# Patient Record
Sex: Female | Born: 1950 | Race: Black or African American | Hispanic: No | Smoking: Never smoker
Health system: Southern US, Community
[De-identification: ages and names within clinical notes are randomized; demographics above are authoritative.]

## PROBLEM LIST (undated history)

## (undated) DIAGNOSIS — R519 Headache, unspecified: Secondary | ICD-10-CM

## (undated) DIAGNOSIS — N189 Chronic kidney disease, unspecified: Secondary | ICD-10-CM

## (undated) DIAGNOSIS — R809 Proteinuria, unspecified: Secondary | ICD-10-CM

## (undated) DIAGNOSIS — I10 Essential (primary) hypertension: Secondary | ICD-10-CM

## (undated) DIAGNOSIS — R51 Headache: Secondary | ICD-10-CM

## (undated) DIAGNOSIS — M1990 Unspecified osteoarthritis, unspecified site: Secondary | ICD-10-CM

## (undated) DIAGNOSIS — E049 Nontoxic goiter, unspecified: Secondary | ICD-10-CM

## (undated) DIAGNOSIS — C50919 Malignant neoplasm of unspecified site of unspecified female breast: Secondary | ICD-10-CM

## (undated) DIAGNOSIS — E039 Hypothyroidism, unspecified: Secondary | ICD-10-CM

## (undated) DIAGNOSIS — C50212 Malignant neoplasm of upper-inner quadrant of left female breast: Secondary | ICD-10-CM

## (undated) DIAGNOSIS — Z972 Presence of dental prosthetic device (complete) (partial): Secondary | ICD-10-CM

## (undated) DIAGNOSIS — M5030 Other cervical disc degeneration, unspecified cervical region: Secondary | ICD-10-CM

## (undated) DIAGNOSIS — E119 Type 2 diabetes mellitus without complications: Secondary | ICD-10-CM

## (undated) DIAGNOSIS — E7800 Pure hypercholesterolemia, unspecified: Secondary | ICD-10-CM

## (undated) DIAGNOSIS — Z923 Personal history of irradiation: Secondary | ICD-10-CM

## (undated) DIAGNOSIS — K219 Gastro-esophageal reflux disease without esophagitis: Secondary | ICD-10-CM

## (undated) HISTORY — PX: ABDOMINAL HYSTERECTOMY: SHX81

## (undated) HISTORY — DX: Malignant neoplasm of unspecified site of unspecified female breast: C50.919

## (undated) HISTORY — PX: THYROIDECTOMY: SHX17

---
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
## 2004-09-13 ENCOUNTER — Ambulatory Visit: Payer: Self-pay

## 2007-01-17 ENCOUNTER — Ambulatory Visit: Payer: Self-pay | Admitting: Emergency Medicine

## 2007-03-01 ENCOUNTER — Ambulatory Visit: Payer: Self-pay | Admitting: Family Medicine

## 2007-05-19 ENCOUNTER — Emergency Department: Payer: Self-pay | Admitting: Emergency Medicine

## 2007-06-27 ENCOUNTER — Ambulatory Visit: Payer: Self-pay | Admitting: Emergency Medicine

## 2008-03-02 ENCOUNTER — Ambulatory Visit: Payer: Self-pay | Admitting: Internal Medicine

## 2008-03-23 ENCOUNTER — Ambulatory Visit: Payer: Self-pay | Admitting: Internal Medicine

## 2008-06-25 ENCOUNTER — Ambulatory Visit: Payer: Self-pay | Admitting: Family Medicine

## 2009-04-23 ENCOUNTER — Ambulatory Visit: Payer: Self-pay | Admitting: Internal Medicine

## 2010-04-27 ENCOUNTER — Ambulatory Visit: Payer: Self-pay | Admitting: Internal Medicine

## 2010-08-18 ENCOUNTER — Ambulatory Visit: Payer: Self-pay | Admitting: Internal Medicine

## 2011-06-05 ENCOUNTER — Ambulatory Visit: Payer: Self-pay | Admitting: Internal Medicine

## 2011-07-20 ENCOUNTER — Ambulatory Visit: Payer: Self-pay | Admitting: Internal Medicine

## 2011-07-20 LAB — URINALYSIS, COMPLETE
Bacteria: NEGATIVE
Bilirubin,UR: NEGATIVE
Blood: NEGATIVE
Glucose,UR: NEGATIVE mg/dL (ref 0–75)
Ketone: NEGATIVE
Nitrite: NEGATIVE
Ph: 7 (ref 4.5–8.0)
RBC,UR: NONE SEEN /HPF (ref 0–5)
Specific Gravity: 1.015 (ref 1.003–1.030)

## 2011-07-30 ENCOUNTER — Ambulatory Visit: Payer: Self-pay

## 2011-10-22 ENCOUNTER — Ambulatory Visit: Payer: Self-pay | Admitting: Family Medicine

## 2012-11-05 ENCOUNTER — Ambulatory Visit: Payer: Self-pay | Admitting: Emergency Medicine

## 2013-01-08 ENCOUNTER — Ambulatory Visit: Payer: Self-pay | Admitting: Family Medicine

## 2013-05-24 ENCOUNTER — Ambulatory Visit: Payer: Self-pay | Admitting: Internal Medicine

## 2013-05-29 ENCOUNTER — Ambulatory Visit: Payer: Self-pay | Admitting: Physician Assistant

## 2013-06-23 ENCOUNTER — Ambulatory Visit: Payer: Self-pay

## 2013-07-21 ENCOUNTER — Ambulatory Visit: Payer: Self-pay

## 2013-07-21 LAB — RAPID INFLUENZA A&B ANTIGENS (ARMC ONLY)

## 2014-05-01 ENCOUNTER — Ambulatory Visit: Payer: Self-pay | Admitting: Physician Assistant

## 2014-06-15 ENCOUNTER — Ambulatory Visit: Payer: Self-pay | Admitting: Internal Medicine

## 2015-01-22 ENCOUNTER — Encounter: Payer: Self-pay | Admitting: Anesthesiology

## 2015-01-22 ENCOUNTER — Encounter: Payer: Self-pay | Admitting: *Deleted

## 2015-01-25 NOTE — Discharge Instructions (Signed)

## 2015-01-30 ENCOUNTER — Ambulatory Visit
Admission: RE | Admit: 2015-01-30 | Payer: BLUE CROSS/BLUE SHIELD | Source: Ambulatory Visit | Admitting: Gastroenterology

## 2015-01-30 HISTORY — DX: Presence of dental prosthetic device (complete) (partial): Z97.2

## 2015-01-30 HISTORY — DX: Type 2 diabetes mellitus without complications: E11.9

## 2015-01-30 HISTORY — DX: Essential (primary) hypertension: I10

## 2015-01-30 HISTORY — DX: Pure hypercholesterolemia, unspecified: E78.00

## 2015-01-30 HISTORY — DX: Headache: R51

## 2015-01-30 HISTORY — DX: Hypothyroidism, unspecified: E03.9

## 2015-01-30 HISTORY — DX: Headache, unspecified: R51.9

## 2015-01-30 SURGERY — COLONOSCOPY

## 2015-02-08 ENCOUNTER — Encounter: Payer: Self-pay | Admitting: *Deleted

## 2015-02-09 ENCOUNTER — Ambulatory Visit
Admission: RE | Admit: 2015-02-09 | Discharge: 2015-02-09 | Disposition: A | Discharge status: Home / Self Care | Payer: BLUE CROSS/BLUE SHIELD | Source: Ambulatory Visit | Attending: Gastroenterology | Admitting: Gastroenterology

## 2015-02-09 ENCOUNTER — Ambulatory Visit: Payer: BLUE CROSS/BLUE SHIELD | Admitting: Anesthesiology

## 2015-02-09 ENCOUNTER — Encounter
Admission: RE | Disposition: A | Discharge status: Home / Self Care | Payer: Self-pay | Source: Ambulatory Visit | Attending: Gastroenterology

## 2015-02-09 ENCOUNTER — Encounter: Payer: Self-pay | Admitting: Anesthesiology

## 2015-02-09 DIAGNOSIS — E119 Type 2 diabetes mellitus without complications: Secondary | ICD-10-CM | POA: Diagnosis not present

## 2015-02-09 DIAGNOSIS — Z888 Allergy status to other drugs, medicaments and biological substances status: Secondary | ICD-10-CM | POA: Insufficient documentation

## 2015-02-09 DIAGNOSIS — Z881 Allergy status to other antibiotic agents status: Secondary | ICD-10-CM | POA: Insufficient documentation

## 2015-02-09 DIAGNOSIS — Z79899 Other long term (current) drug therapy: Secondary | ICD-10-CM | POA: Insufficient documentation

## 2015-02-09 DIAGNOSIS — E890 Postprocedural hypothyroidism: Secondary | ICD-10-CM | POA: Insufficient documentation

## 2015-02-09 DIAGNOSIS — Z791 Long term (current) use of non-steroidal anti-inflammatories (NSAID): Secondary | ICD-10-CM | POA: Diagnosis not present

## 2015-02-09 DIAGNOSIS — I10 Essential (primary) hypertension: Secondary | ICD-10-CM | POA: Insufficient documentation

## 2015-02-09 DIAGNOSIS — Z1211 Encounter for screening for malignant neoplasm of colon: Secondary | ICD-10-CM | POA: Insufficient documentation

## 2015-02-09 DIAGNOSIS — Z90710 Acquired absence of both cervix and uterus: Secondary | ICD-10-CM | POA: Diagnosis not present

## 2015-02-09 DIAGNOSIS — E780 Pure hypercholesterolemia: Secondary | ICD-10-CM | POA: Insufficient documentation

## 2015-02-09 HISTORY — PX: COLONOSCOPY WITH PROPOFOL: SHX5780

## 2015-02-09 HISTORY — DX: Nontoxic goiter, unspecified: E04.9

## 2015-02-09 HISTORY — DX: Proteinuria, unspecified: R80.9

## 2015-02-09 LAB — GLUCOSE, CAPILLARY: GLUCOSE-CAPILLARY: 144 mg/dL — AB (ref 65–99)

## 2015-02-09 SURGERY — COLONOSCOPY WITH PROPOFOL
Anesthesia: General

## 2015-02-09 MED ORDER — LIDOCAINE HCL (CARDIAC) 10 MG/ML IV SOLN
INTRAVENOUS | Status: DC | PRN
  Administered 2015-02-09: 1 mL via INTRAVENOUS

## 2015-02-09 MED ORDER — PROPOFOL 10 MG/ML IV BOLUS
INTRAVENOUS | Status: DC | PRN
  Administered 2015-02-09: 100 mg via INTRAVENOUS

## 2015-02-09 MED ORDER — PROPOFOL INFUSION 10 MG/ML OPTIME
INTRAVENOUS | Status: DC | PRN
  Administered 2015-02-09: 125 ug/kg/min via INTRAVENOUS

## 2015-02-09 MED ORDER — SODIUM CHLORIDE 0.9 % IV SOLN
INTRAVENOUS | Status: DC
Start: 2015-02-09 — End: 2015-02-09
  Administered 2015-02-09: 09:00:00 via INTRAVENOUS

## 2015-02-09 MED ORDER — SODIUM CHLORIDE 0.9 % IV SOLN: INTRAVENOUS | Status: DC

## 2015-02-09 NOTE — Op Note (Signed)
[facility] Gastroenterology Patient Name: Caitlin Ford Procedure Date: 02/09/2015 9:28 AM MRN: [id number] Account #: [id number] Date of Birth: [birth year] Admit Type: Outpatient Age: 63 Room: [facility] ENDO ROOM 4 Gender: Female Note Status: Finalized Procedure:         Colonoscopy Indications:       Screening for colorectal malignant neoplasm Providers:         Lupita Dawn. Candace Cruise, MD Referring MD:      Christena Flake. [name], MD (Referring MD) Medicines:         Monitored Anesthesia Care Complications:     No immediate complications. Procedure:         Pre-Anesthesia Assessment:                    - Prior to the procedure, a History and Physical was                     performed, and patient medications, allergies and                     sensitivities were reviewed. The patient's tolerance of                     previous anesthesia was reviewed.                    - The risks and benefits of the procedure and the sedation                     options and risks were discussed with the patient. All                     questions were answered and informed consent was obtained.                    - After reviewing the risks and benefits, the patient was                     deemed in satisfactory condition to undergo the procedure.                    After obtaining informed consent, the colonoscope was                     passed under direct vision. Throughout the procedure, the                     patient's blood pressure, pulse, and oxygen saturations                     were monitored continuously. The Colonoscope was                     introduced through the anus and advanced to the the cecum,                     identified by appendiceal orifice and ileocecal valve. The                     colonoscopy was performed without difficulty. The patient                     tolerated the procedure well. The quality of the bowel  preparation was good. Findings:      The  colon (entire examined portion) appeared normal. Impression:        - The entire examined colon is normal.                    - No specimens collected. Recommendation:    - Discharge patient to home.                    - Repeat colonoscopy in 10 years for surveillance.                    - The findings and recommendations were discussed with the                     patient. Procedure Code(s): --- Professional ---                    [id number], Colonoscopy, flexible; diagnostic, including                     collection of specimen(s) by brushing or washing, when                     performed (separate procedure) Diagnosis Code(s): --- Professional ---                    Z12.11, Encounter for screening for malignant neoplasm of                     colon CPT copyright 2014 American Medical Association. All rights reserved. The codes documented in this report are preliminary and upon coder review may  be revised to meet current compliance requirements. Hulen Luster, MD 02/09/2015 9:43:45 AM This report has been signed electronically. Number of Addenda: 0 Note Initiated On: 02/09/2015 9:28 AM Scope Withdrawal Time: 0 hours 6 minutes 1 second  Total Procedure Duration: 0 hours 9 minutes 55 seconds       [facility]

## 2015-02-09 NOTE — Transfer of Care (Signed)
Immediate Anesthesia Transfer of Care Note  Patient: Caitlin Ford  Procedure(s) Performed: Procedure(s): COLONOSCOPY WITH PROPOFOL (N/A)  Patient Location: PACU and Endoscopy Unit  Anesthesia Type:General  Level of Consciousness: awake and alert   Airway & Oxygen Therapy: Patient Spontanous Breathing and Patient connected to nasal cannula oxygen  Post-op Assessment: Report given to RN and Post -op Vital signs reviewed and stable  Post vital signs: Reviewed and stable  Last Vitals:  Filed Vitals:   02/09/15 0947  BP:   Pulse:   Temp: 36.1 C  Resp:     Complications: No apparent anesthesia complications

## 2015-02-09 NOTE — H&P (Signed)
Primary Care Physician:  Ezequiel Kayser, MD Primary Gastroenterologist:  Dr. Candace Cruise  Pre-Procedure History & Physical: HPI:  Caitlin Ford is a 64 y.o. female is here for an colonoscopy.   Past Medical History  Diagnosis Date  . Hypothyroidism   . Diabetes mellitus without complication   . Hypertension   . Hypercholesteremia   . Wears dentures     partial upper, full lower  . Headache     simus  . Goiter   . Proteinuria     Past Surgical History  Procedure Laterality Date  . Abdominal hysterectomy    . Thyroidectomy      Prior to Admission medications   Medication Sig Start Date End Date Taking? Authorizing Provider  ibuprofen (ADVIL,MOTRIN) 800 MG tablet Take 800 mg by mouth every 8 (eight) hours as needed.   Yes Historical Provider, MD  levothyroxine (SYNTHROID, LEVOTHROID) 150 MCG tablet Take 150 mcg by mouth daily before breakfast.   Yes Historical Provider, MD  loratadine (CLARITIN) 10 MG tablet Take 10 mg by mouth daily.   Yes Historical Provider, MD  losartan (COZAAR) 100 MG tablet Take 100 mg by mouth daily.   Yes Historical Provider, MD  glimepiride (AMARYL) 4 MG tablet Take 4 mg by mouth daily with breakfast.    Historical Provider, MD  metFORMIN (GLUCOPHAGE) 1000 MG tablet Take 1,000 mg by mouth 2 (two) times daily with a meal.    Historical Provider, MD  Multiple Vitamins-Minerals (DAILY MULTIVITAMIN PO) Take by mouth.    Historical Provider, MD  simvastatin (ZOCOR) 40 MG tablet Take 40 mg by mouth daily.    Historical Provider, MD    Allergies as of 01/23/2015 - Review Complete 01/22/2015  Allergen Reaction Noted  . Altace [ramipril] Other (See Comments) 01/22/2015  . Avandia [rosiglitazone]  01/22/2015  . Crestor [rosuvastatin] Other (See Comments) 01/22/2015  . Doxycycline  01/22/2015  . Lipitor [atorvastatin] Other (See Comments) 01/22/2015    No family history on file.  Social History   Social History  . Marital Status: Married    Spouse Name:  N/A  . Number of Children: N/A  . Years of Education: N/A   Occupational History  . Not on file.   Social History Main Topics  . Smoking status: Never Smoker   . Smokeless tobacco: Not on file  . Alcohol Use: Not on file  . Drug Use: Not on file  . Sexual Activity: Not on file   Other Topics Concern  . Not on file   Social History Narrative    Review of Systems: See HPI, otherwise negative ROS  Physical Exam: BP 151/80 mmHg  Pulse 74  Temp(Src) 97.7 F (36.5 C) (Tympanic)  Resp 20  Ht 5\' 7"  (1.702 m)  Wt 90.719 kg (200 lb)  BMI 31.32 kg/m2  SpO2 100% General:   Alert,  pleasant and cooperative in NAD Head:  Normocephalic and atraumatic. Neck:  Supple; no masses or thyromegaly. Lungs:  Clear throughout to auscultation.    Heart:  Regular rate and rhythm. Abdomen:  Soft, nontender and nondistended. Normal bowel sounds, without guarding, and without rebound.   Neurologic:  Alert and  oriented x4;  grossly normal neurologically.  Impression/Plan: Itsel Opfer is here for an colonoscopy to be performed for screening.  Risks, benefits, limitations, and alternatives regarding colonoscopy have been reviewed with the patient.  Questions have been answered.  All parties agreeable.   OH, Lupita Dawn, MD  02/09/2015, 8:44 AM

## 2015-02-09 NOTE — Anesthesia Postprocedure Evaluation (Signed)
  Anesthesia Post-op Note  Patient: Caitlin Ford  Procedure(s) Performed: Procedure(s): COLONOSCOPY WITH PROPOFOL (N/A)  Anesthesia type:General  Patient location: PACU  Post pain: Pain level controlled  Post assessment: Post-op Vital signs reviewed, Patient's Cardiovascular Status Stable, Respiratory Function Stable, Patent Airway and No signs of Nausea or vomiting  Post vital signs: Reviewed and stable  Last Vitals:  Filed Vitals:   02/09/15 1020  BP: 133/79  Pulse: 67  Temp:   Resp: 15    Level of consciousness: awake, alert  and patient cooperative  Complications: No apparent anesthesia complications

## 2015-02-09 NOTE — Anesthesia Preprocedure Evaluation (Signed)
Anesthesia Evaluation  Patient identified by MRN, date of birth, ID band Patient awake    Reviewed: Allergy & Precautions, H&P , NPO status , Patient's Chart, lab work & pertinent test results, reviewed documented beta blocker date and time   History of Anesthesia Complications Negative for: history of anesthetic complications  Airway Mallampati: II  TM Distance: >3 FB Neck ROM: full    Dental no notable dental hx. (+) Partial Upper, Lower Dentures   Pulmonary neg pulmonary ROS,    Pulmonary exam normal breath sounds clear to auscultation       Cardiovascular Exercise Tolerance: Good hypertension, Normal cardiovascular exam Rhythm:regular Rate:Normal     Neuro/Psych negative neurological ROS  negative psych ROS   GI/Hepatic negative GI ROS, Neg liver ROS,   Endo/Other  diabetes, Well Controlled, Oral Hypoglycemic AgentsHypothyroidism   Renal/GU negative Renal ROS  negative genitourinary   Musculoskeletal   Abdominal   Peds  Hematology negative hematology ROS (+)   Anesthesia Other Findings Past Medical History:   Hypothyroidism                                               Diabetes mellitus without complication                       Hypertension                                                 Hypercholesteremia                                           Wears dentures                                                 Comment:partial upper, full lower   Headache                                                       Comment:simus   Goiter                                                       Proteinuria                                                  Reproductive/Obstetrics negative OB ROS                             Anesthesia Physical Anesthesia Plan  ASA: III  Anesthesia Plan: General   Post-op Pain Management:    Induction:   Airway Management Planned:   Additional  Equipment:   Intra-op Plan:   Post-operative Plan:   Informed Consent: I have reviewed the patients History and Physical, chart, labs and discussed the procedure including the risks, benefits and alternatives for the proposed anesthesia with the patient or authorized representative who has indicated his/her understanding and acceptance.   Dental Advisory Given  Plan Discussed with: Anesthesiologist, CRNA and Surgeon  Anesthesia Plan Comments:         Anesthesia Quick Evaluation

## 2015-02-09 NOTE — Anesthesia Procedure Notes (Signed)
Date/Time: 02/09/2015 9:27 AM Performed by: Martha Clan Oxygen Delivery Method: Nasal cannula

## 2015-02-10 ENCOUNTER — Encounter: Payer: Self-pay | Admitting: Gastroenterology

## 2015-07-01 ENCOUNTER — Encounter: Payer: Self-pay | Admitting: *Deleted

## 2015-07-01 ENCOUNTER — Ambulatory Visit
Admission: EM | Admit: 2015-07-01 | Discharge: 2015-07-01 | Disposition: A | Discharge status: Home / Self Care | Payer: BLUE CROSS/BLUE SHIELD | Attending: Family Medicine | Admitting: Family Medicine

## 2015-07-01 DIAGNOSIS — T148 Other injury of unspecified body region: Secondary | ICD-10-CM

## 2015-07-01 DIAGNOSIS — T148XXA Other injury of unspecified body region, initial encounter: Secondary | ICD-10-CM

## 2015-07-01 LAB — URINALYSIS COMPLETE WITH MICROSCOPIC (ARMC ONLY)
BACTERIA UA: NONE SEEN
Bilirubin Urine: NEGATIVE
Glucose, UA: NEGATIVE mg/dL
HGB URINE DIPSTICK: NEGATIVE
LEUKOCYTES UA: NEGATIVE
NITRITE: NEGATIVE
PH: 5.5 (ref 5.0–8.0)
PROTEIN: 30 mg/dL — AB
RBC / HPF: NONE SEEN RBC/hpf (ref 0–5)
SPECIFIC GRAVITY, URINE: 1.02 (ref 1.005–1.030)

## 2015-07-01 MED ORDER — CYCLOBENZAPRINE HCL 5 MG PO TABS: 5.0000 mg | ORAL_TABLET | Freq: Three times a day (TID) | ORAL | Status: DC | PRN

## 2015-07-01 MED ORDER — IBUPROFEN 800 MG PO TABS: 800.0000 mg | ORAL_TABLET | Freq: Three times a day (TID) | ORAL | Status: DC | PRN

## 2015-07-01 MED ORDER — KETOROLAC TROMETHAMINE 60 MG/2ML IM SOLN
60.0000 mg | Freq: Once | INTRAMUSCULAR | Status: AC
  Administered 2015-07-01: 60 mg via INTRAMUSCULAR

## 2015-07-01 NOTE — ED Provider Notes (Signed)
[facility]  ____________________________________________  Time seen: Approximately 3:45 PM  I have reviewed the triage vital signs and the nursing notes.   HISTORY  Chief Complaint Back Pain   HPI Caitlin Ford is a 65 y.o. female  presents with a complaint of right lower back pain. Patient states that right lower back pain has been present 1 week. Patient states that one week ago she was at her home and trying to paint. Patient states that in the process of painting she was moving some furniture around. Patient states that while she was moving some furniture she did feel some lower back pain intermittently that was gradual onset with multiple movements. Denies any one particular movement that caused onset of back pain. Denies any fall or direct trauma. Patient reports that the next day the pain was present when she woke up. Patient states that pain has continued throughout the week.   Patient states that if sitting still and not moving she does not have any pain. Patient states that the pain is only present with movement. Patient states that the worst pain is with bending down and up or twisting to her right. States current pain is 0 out of 10 but with movement and states that the pain is 5 out of 10. Patient states the pain feels almost like a spasm that catches her. Denies history of falls at home.  Denies any pain radiation. Denies any numbness or tingling sensation. Denies any urinary or bowel retention or incontinence. Denies any weakness or numbness. Denies any urinary frequency, urgency, burning with urination, dysuria, vaginal complaints, hematuria or abnormal stool. Denies abnormal urine color abnormal stool color.  PCP: Dr Mayo Ao menopausal   Past Medical History  Diagnosis Date  . Hypothyroidism   . Diabetes mellitus without complication ([place])   . Hypertension   . Hypercholesteremia   . Wears dentures     partial upper, full lower  . Headache     simus   . Goiter   . Proteinuria     There are no active problems to display for this patient.   Past Surgical History  Procedure Laterality Date  . Abdominal hysterectomy    . Thyroidectomy    . Colonoscopy with propofol N/A 02/09/2015    Procedure: COLONOSCOPY WITH PROPOFOL;  Surgeon: Hulen Luster, MD;  Location: [facility] ENDOSCOPY;  Service: Gastroenterology;  Laterality: N/A;    Current Outpatient Rx  Name  Route  Sig  Dispense  Refill  . glimepiride (AMARYL) 4 MG tablet   Oral   Take 4 mg by mouth daily with breakfast.         . ibuprofen (ADVIL,MOTRIN) 800 MG tablet   Oral   Take 800 mg by mouth every 8 (eight) hours as needed.         [name] levothyroxine (SYNTHROID, LEVOTHROID) 150 MCG tablet   Oral   Take 150 mcg by mouth daily before breakfast.         . losartan (COZAAR) 100 MG tablet   Oral   Take 100 mg by mouth daily.         . metFORMIN (GLUCOPHAGE) 1000 MG tablet   Oral   Take 1,000 mg by mouth 2 (two) times daily with a meal.         . Multiple Vitamins-Minerals (DAILY MULTIVITAMIN PO)   Oral   Take by mouth.         . simvastatin (ZOCOR) 40 MG tablet  Oral   Take 40 mg by mouth daily.         [name] loratadine (CLARITIN) 10 MG tablet   Oral   Take 10 mg by mouth daily.           Allergies Altace; Crestor; Lipitor; Avandia; and Doxycycline  History reviewed. No pertinent family history.  Social History Social History  Substance Use Topics  . Smoking status: Never Smoker   . Smokeless tobacco: Never Used  . Alcohol Use: No    Review of Systems Constitutional: No fever/chills Eyes: No visual changes. ENT: No sore throat. Cardiovascular: Denies chest pain. Respiratory: Denies shortness of breath. Gastrointestinal: No abdominal pain.  No nausea, no vomiting.  No diarrhea.  No constipation. Genitourinary: Negative for dysuria. Musculoskeletal: positive for back pain. Skin: Negative for rash. Neurological: Negative for headaches, focal  weakness or numbness.  10-point ROS otherwise negative.  ____________________________________________   PHYSICAL EXAM:  VITAL SIGNS: ED Triage Vitals  Enc Vitals Group     BP 07/01/15 1517 163/77 mmHg     Pulse Rate 07/01/15 1517 72     Resp 07/01/15 1517 16     Temp 07/01/15 1517 98.2 F (36.8 C)     Temp Source 07/01/15 1517 Oral     SpO2 07/01/15 1517 100 %     Weight 07/01/15 1517 200 lb (90.719 kg)     Height 07/01/15 1517 5\' 7"  (1.702 m)     Head Cir --      Peak Flow --      Pain Score 07/01/15 1524 1     Pain Loc --      Pain Edu? --      Excl. in [place]? --     Constitutional: Alert and oriented. Well appearing and in no acute distress. Eyes: Conjunctivae are normal. PERRL. EOMI. Head: Atraumatic.  Ears: no erythema, normal TMs bilaterally.   Nose: No congestion/rhinnorhea.  Mouth/Throat: Mucous membranes are moist.  Oropharynx non-erythematous. Neck: No stridor.  No cervical spine tenderness to palpation. Hematological/Lymphatic/Immunilogical: No cervical lymphadenopathy. Cardiovascular: Normal rate, regular rhythm. Grossly normal heart sounds.  Good peripheral circulation. Respiratory: Normal respiratory effort.  No retractions. Lungs CTAB. Gastrointestinal: Soft and nontender. No distention. Normal Bowel sounds.  No abdominal bruits. No CVA tenderness. Musculoskeletal: No lower or upper extremity tenderness nor edema.  No joint effusions. Bilateral pedal pulses equal and easily palpated.  no midline cervical, thoracic or lumbar tender to palpation. No saddle anesthesia. Bilateral straight leg test negative.  Except: pain fully reproducible per patient, with active flexion and extension of lumbar as well as lumbar rotation, Mild TTP right paralumbar, no swelling, no ecchymosis, full ROM present. No saddle anesthesia. Bilateral plantar flexion and dorsiflexion strong and equal. 5/5 strength to bilateral upper and lower extremities. Changes positions from lying to  standing quickly without discomfort or distress.  Neurologic:  Normal speech and language. No gross focal neurologic deficits are appreciated. No gait instability. Skin:  Skin is warm, dry and intact. No rash noted. Psychiatric: Mood and affect are normal. Speech and behavior are normal.  ____________________________________________   LABS (all labs ordered are listed, but only abnormal results are displayed)  Labs Reviewed  URINALYSIS COMPLETEWITH MICROSCOPIC ([facility] ONLY) - Abnormal; Notable for the following:    Ketones, ur TRACE (*)    Protein, ur 30 (*)    Squamous Epithelial / LPF 0-5 (*)    All other components within normal limits   _  INITIAL IMPRESSION / ASSESSMENT  AND PLAN / ED COURSE  Pertinent labs & imaging results that were available during my care of the patient were reviewed by me and considered in my medical decision making (see chart for details).  Very well-appearing patient. No acute distress. Presents for the complaint of right lower lateral lumbar pain with movement which was after moving furniture around in her house. No midline tenderness, no bony tenderness. No pain at rest. Full range of motion. No focal neurological deficit. Per patient pain fully reproducible with active flexion and extension of lumbar as well as lumbar rotation. Urinalysis negative for bacteria, RBCs and hemoglobin. Suspect muscular strain. Toradol 60 mg IM 1 given in urgent care.   Post Toradol patient reports pain improved. Very well-appearing patient. Will treat patient outpatient with oral ibuprofen and Flexeril as needed. Patient reports she has taked Flexeril in the past and tolerated well.  Discussed follow up with Primary care physician this week. Discussed follow up and return parameters including no resolution or any worsening concerns. Patient verbalized understanding and agreed to plan.   ____________________________________________   FINAL CLINICAL IMPRESSION(S) / ED  DIAGNOSES  Final diagnoses:  Muscle strain      Note: This dictation was prepared with Dragon dictation along with smaller phrase technology. Any transcriptional errors that result from this process are unintentional.    Marylene Land, NP 07/04/15 1744

## 2015-07-01 NOTE — Discharge Instructions (Signed)
Take medication as prescribed. Rest. Drink plenty of fluids. Alternate heat and ice for comfort.  ° °Follow up with your primary care physician this week as needed. Return to Urgent care for new or worsening concerns.  ° °

## 2015-07-01 NOTE — ED Notes (Signed)
Patient states that she experienced right-sided lower back pain starting 1 week ago after moving some furniture.  No pain going down legs.

## 2015-07-20 ENCOUNTER — Ambulatory Visit
Admission: EM | Admit: 2015-07-20 | Discharge: 2015-07-20 | Disposition: A | Discharge status: Home / Self Care | Payer: BLUE CROSS/BLUE SHIELD | Attending: Family Medicine | Admitting: Family Medicine

## 2015-07-20 DIAGNOSIS — B029 Zoster without complications: Secondary | ICD-10-CM

## 2015-07-20 MED ORDER — TRAMADOL HCL 50 MG PO TABS: 50.0000 mg | ORAL_TABLET | Freq: Three times a day (TID) | ORAL | Status: DC | PRN

## 2015-07-20 MED ORDER — VALACYCLOVIR HCL 1 G PO TABS: 1000.0000 mg | ORAL_TABLET | Freq: Three times a day (TID) | ORAL | Status: AC

## 2015-07-20 NOTE — Discharge Instructions (Signed)
Take medication as prescribed. Rest. Avoid rubbing or scratching. Wear soft bra that does not have an underwire that rubs that area.  Drink plenty of fluids.   Follow up with your primary care physician this week as needed. Return to Urgent care for new or worsening concerns.    Shingles Shingles, which is also known as herpes zoster, is an infection that causes a painful skin rash and fluid-filled blisters. Shingles is not related to genital herpes, which is a sexually transmitted infection.   Shingles only develops in people who:  Have had chickenpox.  Have received the chickenpox vaccine. (This is rare.) CAUSES Shingles is caused by varicella-zoster virus (VZV). This is the same virus that causes chickenpox. After exposure to VZV, the virus stays in the body in an inactive (dormant) state. Shingles develops if the virus reactivates. This can happen many years after the initial exposure to VZV. It is not known what causes this virus to reactivate. RISK FACTORS People who have had chickenpox or received the chickenpox vaccine are at risk for shingles. Infection is more common in people who:  Are older than age 39.  Have a weakened defense (immune) system, such as those with HIV, AIDS, or cancer.  Are taking medicines that weaken the immune system, such as transplant medicines.  Are under great stress. SYMPTOMS Early symptoms of this condition include itching, tingling, and pain in an area on your skin. Pain may be described as burning, stabbing, or throbbing. A few days or weeks after symptoms start, a painful red rash appears, usually on one side of the body in a bandlike or beltlike pattern. The rash eventually turns into fluid-filled blisters that break open, scab over, and dry up in about 2-3 weeks. At any time during the infection, you may also develop:  A fever.  Chills.  A headache.  An upset stomach. DIAGNOSIS This condition is diagnosed with a skin exam. Sometimes,  skin or fluid samples are taken from the blisters before a diagnosis is made. These samples are examined under a microscope or sent to a lab for testing. TREATMENT There is no specific cure for this condition. Your health care provider will probably prescribe medicines to help you manage pain, recover more quickly, and avoid long-term problems. Medicines may include:  Antiviral drugs.  Anti-inflammatory drugs.  Pain medicines. If the area involved is on your face, you may be referred to a specialist, such as an eye doctor (ophthalmologist) or an ear, nose, and throat (ENT) doctor to help you avoid eye problems, chronic pain, or disability. HOME CARE INSTRUCTIONS Medicines  Take medicines only as directed by your health care provider.  Apply an anti-itch or numbing cream to the affected area as directed by your health care provider. Blister and Rash Care  Take a cool bath or apply cool compresses to the area of the rash or blisters as directed by your health care provider. This may help with pain and itching.  Keep your rash covered with a loose bandage (dressing). Wear loose-fitting clothing to help ease the pain of material rubbing against the rash.  Keep your rash and blisters clean with mild soap and cool water or as directed by your health care provider.  Check your rash every day for signs of infection. These include redness, swelling, and pain that lasts or increases.  Do not pick your blisters.  Do not scratch your rash. General Instructions  Rest as directed by your health care provider.  Keep all follow-up  visits as directed by your health care provider. This is important.  Until your blisters scab over, your infection can cause chickenpox in people who have never had it or been vaccinated against it. To prevent this from happening, avoid contact with other people, especially:  Babies.  Pregnant women.  Children who have eczema.  Elderly people who have  transplants.  People who have chronic illnesses, such as leukemia or AIDS. SEEK MEDICAL CARE IF:  Your pain is not relieved with prescribed medicines.  Your pain does not get better after the rash heals.  Your rash looks infected. Signs of infection include redness, swelling, and pain that lasts or increases. SEEK IMMEDIATE MEDICAL CARE IF:  The rash is on your face or nose.  You have facial pain, pain around your eye area, or loss of feeling on one side of your face.  You have ear pain or you have ringing in your ear.  You have loss of taste.  Your condition gets worse.   This information is not intended to replace advice given to you by your health care provider. Make sure you discuss any questions you have with your health care provider.   Document Released: 05/12/2005 Document Revised: 06/02/2014 Document Reviewed: 03/23/2014 Elsevier Interactive Patient Education [employer].

## 2015-07-20 NOTE — ED Notes (Signed)
Patient c/o painful rash upper left side of stomach which she noticed this past Monday.

## 2015-07-20 NOTE — ED Provider Notes (Addendum)
[facility]  ____________________________________________  Time seen: Approximately 9:29 PM  I have reviewed the triage vital signs and the nursing notes.   HISTORY  Chief Complaint Rash   HPI Caitlin Ford is a 65 y.o. female presents for complaint of rash to left upper abdomen 3 days. Patient states that the rash is tender to touch and described as a burning sensation. States that is more burning sensation than a pain. Denies pain. Denies known trigger. Denies changes in foods, medicines, lotions, detergents or other contacts. States that occasionally he does have some itching sensations. Denies other rash anywhere. Denies injury.Denies fever. Reports continues to eat and drink well. Denies insect bite, tick bite or tick exposure.  Denies other complaints. Denies abdominal pain, chest pain, shortness of breath, extremity swelling.    PCP: Dr. Dorthula Perfect. Patient reports she has an appointment with him in 1 week.   Past Medical History  Diagnosis Date  . Hypothyroidism   . Diabetes mellitus without complication ([place])   . Hypertension   . Hypercholesteremia   . Wears dentures     partial upper, full lower  . Headache     simus  . Goiter   . Proteinuria     There are no active problems to display for this patient.   Past Surgical History  Procedure Laterality Date  . Abdominal hysterectomy    . Thyroidectomy    . Colonoscopy with propofol N/A 02/09/2015    Procedure: COLONOSCOPY WITH PROPOFOL;  Surgeon: Hulen Luster, MD;  Location: [facility] ENDOSCOPY;  Service: Gastroenterology;  Laterality: N/A;    Current Outpatient Rx  Name  Route  Sig  Dispense  Refill  . glimepiride (AMARYL) 4 MG tablet   Oral   Take 4 mg by mouth daily with breakfast.         . levothyroxine (SYNTHROID, LEVOTHROID) 150 MCG tablet   Oral   Take 150 mcg by mouth daily before breakfast.         . loratadine (CLARITIN) 10 MG tablet   Oral   Take 10 mg by mouth daily.         [name]  losartan (COZAAR) 100 MG tablet   Oral   Take 100 mg by mouth daily.         . metFORMIN (GLUCOPHAGE) 1000 MG tablet   Oral   Take 1,000 mg by mouth 2 (two) times daily with a meal.         . Multiple Vitamins-Minerals (DAILY MULTIVITAMIN PO)   Oral   Take by mouth.         . simvastatin (ZOCOR) 40 MG tablet   Oral   Take 40 mg by mouth daily.         .           .           .           .             Allergies Altace; Crestor; Lipitor; Avandia; and Doxycycline  History reviewed. No pertinent family history.  Social History Social History  Substance Use Topics  . Smoking status: Never Smoker   . Smokeless tobacco: Never Used  . Alcohol Use: No    Review of Systems Constitutional: No fever/chills Eyes: No visual changes. ENT: No sore throat. Cardiovascular: Denies chest pain. Respiratory: Denies shortness of breath. Gastrointestinal: No abdominal pain.  No nausea, no vomiting.  No diarrhea.  No  constipation. Genitourinary: Negative for dysuria. Musculoskeletal: Negative for back pain. Skin:Positive for rash. Neurological: Negative for headaches, focal weakness or numbness.  10-point ROS otherwise negative.  ____________________________________________   PHYSICAL EXAM:  VITAL SIGNS: ED Triage Vitals  Enc Vitals Group     BP 07/20/15 2031 149/83 mmHg     Pulse Rate 07/20/15 2031 84     Resp 07/20/15 2031 18     Temp 07/20/15 2031 97.8 F (36.6 C)     Temp Source 07/20/15 2031 Oral     SpO2 07/20/15 2031 100 %     Weight 07/20/15 2031 200 lb (90.719 kg)     Height 07/20/15 2031 5\' 7"  (1.702 m)     Head Cir --      Peak Flow --      Pain Score 07/20/15 2036 4     Pain Loc --      Pain Edu? --      Excl. in [place]? --     Constitutional: Alert and oriented. Well appearing and in no acute distress. Eyes: Conjunctivae are normal. PERRL. EOMI. Head: Atraumatic.  Nose: No congestion/rhinnorhea.  Mouth/Throat: Mucous membranes are moist.   Oropharynx non-erythematous. Neck: No stridor.  No cervical spine tenderness to palpation. Hematological/Lymphatic/Immunilogical: No cervical lymphadenopathy. Cardiovascular: Normal rate, regular rhythm. Grossly normal heart sounds.  Good peripheral circulation. Respiratory: Normal respiratory effort.  No retractions. Lungs CTAB. Gastrointestinal: Soft and nontender. No distention. Normal Bowel sounds.  No CVA tenderness. Musculoskeletal: No lower or upper extremity tenderness nor edema. No cervical, thoracic or lumbar tenderness to palpation.  Neurologic: Normal speech and language. No gross focal neurologic deficits are appreciated. No gait instability.  Skin:  Skin is warm, dry and intact. No rash noted. Except: Left upper abdomen with erythematic clustered vesicular rash that is mildly tenderness to palpation, no swelling, no surrounding erythema, no fluctuance, no induration, no active drainage. Rash not cross midline. No other rash noted. Psychiatric: Mood and affect are normal. Speech and behavior are normal.  ____________________________________________   LABS (all labs ordered are listed, but only abnormal results are displayed)  Labs Reviewed - No data to display   INITIAL IMPRESSION / ASSESSMENT AND PLAN / ED COURSE  Pertinent labs & imaging results that were available during my care of the patient were reviewed by me and considered in my medical decision making (see chart for details).  Very well-appearing patient. No acute distress. Presents for the complaint of left upper abdominal rash present 3 days. States some burning and irritation, with some itching. States that she has not been scratching it but occasionally "rubbing the area." Denies drainage. Denies fevers. Denies known trigger. Abdomen soft and nontender. Lungs clear throughout. Rash left upper abdomen clinical appearance consistent with shingles rash. No other rash noted. Will treat patient with oral valacyclovir,  when necessary tramadol, encourage close monitoring of area. Encouraged patient to wear non-underwire bra as underwire may cause increased irritation. Encouraged monitoring of the rash. Encourage PCP follow-up next week.  Discussed follow up with Primary care physician this week. Discussed follow up and return parameters including no resolution or any worsening concerns. Patient verbalized understanding and agreed to plan.   ____________________________________________   FINAL CLINICAL IMPRESSION(S) / ED DIAGNOSES  Final diagnoses:  Shingles rash      Note: This dictation was prepared with Dragon dictation along with smaller phrase technology. Any transcriptional errors that result from this process are unintentional.    Marylene Land, NP 07/20/15 2219  Mendel Ryder  Sabra Heck, NP 07/27/15 1627

## 2016-02-26 ENCOUNTER — Ambulatory Visit
Admission: EM | Admit: 2016-02-26 | Discharge: 2016-02-26 | Disposition: A | Discharge status: Home / Self Care | Payer: BLUE CROSS/BLUE SHIELD | Attending: Family Medicine | Admitting: Family Medicine

## 2016-02-26 DIAGNOSIS — G5632 Lesion of radial nerve, left upper limb: Secondary | ICD-10-CM

## 2016-02-26 DIAGNOSIS — M79602 Pain in left arm: Secondary | ICD-10-CM

## 2016-02-26 MED ORDER — MELOXICAM 15 MG PO TABS: 15.0000 mg | ORAL_TABLET | Freq: Every day | ORAL | 0 refills | Status: DC

## 2016-02-26 NOTE — ED Triage Notes (Signed)
Patient complains of left forearm pain that started one week ago. Patient states that pain radiates down her arm to hand and hand has been swelling some.

## 2016-02-26 NOTE — ED Provider Notes (Signed)
[facility]    CSN: [id number] Arrival date & time: 02/26/16  [id number]     History   Chief Complaint Chief Complaint  Patient presents with  . Arm Pain    left  . Hand Pain    left    HPI Caitlin Ford is a 65 y.o. female.   Patient reports swelling in her left wrist for about 2 weeks and now with increased pain and numbness of the thumb that shoots down her thumb and up her left forearm. She denies injury any new accidents or trauma to her left wrist and forearm. Past smoking history she has history diabetes hypertension hyperlipidemia is being treated for those 2 conditions she's had thyroidectomy and hysterectomy as well. Final crit history is not pertinent to today's visit and she does not smoke.   The history is provided by the patient. No language interpreter was used.  Arm Pain  This is a new problem. The current episode started more than 1 week ago. The problem occurs constantly. The problem has been gradually worsening. Pertinent negatives include no chest pain, no abdominal pain, no headaches and no shortness of breath. The symptoms are aggravated by twisting. Nothing relieves the symptoms. She has tried nothing for the symptoms. The treatment provided no relief.  Hand Pain  Pertinent negatives include no chest pain, no abdominal pain, no headaches and no shortness of breath.    Past Medical History:  Diagnosis Date  . Diabetes mellitus without complication ([place])   . Goiter   . Headache    simus  . Hypercholesteremia   . Hypertension   . Hypothyroidism   . Proteinuria   . Wears dentures    partial upper, full lower    There are no active problems to display for this patient.   Past Surgical History:  Procedure Laterality Date  . ABDOMINAL HYSTERECTOMY    . COLONOSCOPY WITH PROPOFOL N/A 02/09/2015   Procedure: COLONOSCOPY WITH PROPOFOL;  Surgeon: Hulen Luster, MD;  Location: [facility] ENDOSCOPY;  Service: Gastroenterology;  Laterality: N/A;  .  THYROIDECTOMY      OB History    No data available       Home Medications    Prior to Admission medications   Medication Sig Start Date End Date Taking? Authorizing Provider  glimepiride (AMARYL) 4 MG tablet Take 4 mg by mouth daily with breakfast.   Yes Historical Provider, MD  ibuprofen (ADVIL,MOTRIN) 800 MG tablet Take 1 tablet (800 mg total) by mouth every 8 (eight) hours as needed for mild pain or moderate pain. 07/01/15  Yes Marylene Land, NP  levothyroxine (SYNTHROID, LEVOTHROID) 150 MCG tablet Take 150 mcg by mouth daily before breakfast.   Yes Historical Provider, MD  loratadine (CLARITIN) 10 MG tablet Take 10 mg by mouth daily.   Yes Historical Provider, MD  losartan (COZAAR) 100 MG tablet Take 100 mg by mouth daily.   Yes Historical Provider, MD  metFORMIN (GLUCOPHAGE) 1000 MG tablet Take 1,000 mg by mouth 2 (two) times daily with a meal.   Yes Historical Provider, MD  Multiple Vitamins-Minerals (DAILY MULTIVITAMIN PO) Take by mouth.   Yes Historical Provider, MD  simvastatin (ZOCOR) 40 MG tablet Take 40 mg by mouth daily.   Yes Historical Provider, MD  sitaGLIPtin (JANUVIA) 100 MG tablet Take 100 mg by mouth daily.   Yes Historical Provider, MD  cyclobenzaprine (FLEXERIL) 5 MG tablet Take 1 tablet (5 mg total) by mouth every 8 (eight) hours as  needed for muscle spasms (or pain. Do not drive or operate machinery while taking as can cause drowsiness.). 07/01/15   Marylene Land, NP  meloxicam (MOBIC) 15 MG tablet Take 1 tablet (15 mg total) by mouth daily. 02/26/16   Frederich Cha, MD  traMADol (ULTRAM) 50 MG tablet Take 1 tablet (50 mg total) by mouth every 8 (eight) hours as needed (Do not drive or operate machinery while taking as can cause drowsiness.). 07/20/15   Marylene Land, NP    Family History History reviewed. No pertinent family history.  Social History Social History  Substance Use Topics  . Smoking status: Never Smoker  . Smokeless tobacco: Never Used  . Alcohol  use No     Allergies   Altace [ramipril]; Crestor [rosuvastatin]; Lipitor [atorvastatin]; Avandia [rosiglitazone]; and Doxycycline   Review of Systems Review of Systems  Respiratory: Negative for shortness of breath.   Cardiovascular: Negative for chest pain.  Gastrointestinal: Negative for abdominal pain.  Musculoskeletal: Positive for myalgias.  Neurological: Negative for headaches.  All other systems reviewed and are negative.    Physical Exam Triage Vital Signs ED Triage Vitals  Enc Vitals Group     BP 02/26/16 0906 (!) 142/80     Pulse Rate 02/26/16 0906 79     Resp 02/26/16 0906 16     Temp 02/26/16 0906 98.1 F (36.7 C)     Temp Source 02/26/16 0906 Oral     SpO2 02/26/16 0906 99 %     Weight 02/26/16 0906 195 lb (88.5 kg)     Height 02/26/16 0906 5\' 7"  (1.702 m)     Head Circumference --      Peak Flow --      Pain Score 02/26/16 0908 5     Pain Loc --      Pain Edu? --      Excl. in [place]? --    No data found.   Updated Vital Signs BP (!) 142/80 (BP Location: Left Arm)   Pulse 79   Temp 98.1 F (36.7 C) (Oral)   Resp 16   Ht 5\' 7"  (1.702 m)   Wt 195 lb (88.5 kg)   SpO2 99%   BMI 30.54 kg/m   Visual Acuity Right Eye Distance:   Left Eye Distance:   Bilateral Distance:    Right Eye Near:   Left Eye Near:    Bilateral Near:     Physical Exam  Constitutional: She is oriented to person, place, and time. She appears well-developed and well-nourished.  HENT:  Head: Normocephalic and atraumatic.  Eyes: Pupils are equal, round, and reactive to light.  Pulmonary/Chest: Effort normal.  Musculoskeletal: She exhibits edema and tenderness. She exhibits no deformity.       Left wrist: She exhibits no tenderness.       Left forearm: She exhibits tenderness and swelling.       Arms: Patient has swelling along the left forearm and left hand and thumb there is no tenderness over the navicular area actually good range of motion is present but she has marked  tenderness over the distal radius consistent with de Quervain's tenosynovitis.  Neurological: She is alert and oriented to person, place, and time.  Skin: Skin is warm and dry.  Psychiatric: She has a normal mood and affect.  Vitals reviewed.    UC Treatments / Results  Labs (all labs ordered are listed, but only abnormal results are displayed) Labs Reviewed - No data to display  EKG  EKG Interpretation None       Radiology No results found.  Procedures Procedures (including critical care time)  Medications Ordered in UC Medications - No data to display   Initial Impression / Assessment and Plan / UC Course  I have reviewed the triage vital signs and the nursing notes.  Pertinent labs & imaging results that were available during my care of the patient were reviewed by me and considered in my medical decision making (see chart for details).  Clinical Course    De Quervain's tenosynovitis. We'll place patient on Mobic 15 mg 1 tablet day wrist with radial support she does not wear this 24 7 but should wear this most the day not better in 2 weeks and recommend follow-up with orthopedic may need to have cortisone injection or surgical release if this problem persist.  Final Clinical Impressions(s) / UC Diagnoses   Final diagnoses:  Left arm pain  Radial nerve entrapment, left    New Prescriptions Discharge Medication List as of 02/26/2016  9:55 AM       Frederich Cha, MD 02/26/16 1016

## 2016-02-28 ENCOUNTER — Telehealth: Payer: Self-pay

## 2016-02-28 NOTE — Telephone Encounter (Signed)
Courtesy call back completed today for patient's recent visit at [facility]. Patient did not answer, left message on machine to call back with any questions or concerns.   

## 2016-08-07 ENCOUNTER — Other Ambulatory Visit: Payer: Self-pay | Admitting: Internal Medicine

## 2016-08-07 DIAGNOSIS — Z1231 Encounter for screening mammogram for malignant neoplasm of breast: Secondary | ICD-10-CM

## 2016-08-12 ENCOUNTER — Other Ambulatory Visit: Payer: Self-pay | Admitting: Internal Medicine

## 2016-08-12 ENCOUNTER — Ambulatory Visit
Admission: RE | Admit: 2016-08-12 | Discharge: 2016-08-12 | Disposition: A | Discharge status: Home / Self Care | Payer: BLUE CROSS/BLUE SHIELD | Source: Ambulatory Visit | Attending: Internal Medicine | Admitting: Internal Medicine

## 2016-08-12 DIAGNOSIS — Z1231 Encounter for screening mammogram for malignant neoplasm of breast: Secondary | ICD-10-CM

## 2016-12-18 ENCOUNTER — Ambulatory Visit
Admission: EM | Admit: 2016-12-18 | Discharge: 2016-12-18 | Disposition: A | Discharge status: Home / Self Care | Payer: Medicare HMO | Attending: Family Medicine | Admitting: Family Medicine

## 2016-12-18 DIAGNOSIS — M545 Low back pain, unspecified: Secondary | ICD-10-CM

## 2016-12-18 LAB — URINALYSIS, COMPLETE (UACMP) WITH MICROSCOPIC
Bilirubin Urine: NEGATIVE
GLUCOSE, UA: NEGATIVE mg/dL
HGB URINE DIPSTICK: NEGATIVE
Ketones, ur: NEGATIVE mg/dL
Leukocytes, UA: NEGATIVE
Nitrite: NEGATIVE
PH: 7.5 (ref 5.0–8.0)
Protein, ur: NEGATIVE mg/dL
RBC / HPF: NONE SEEN RBC/hpf (ref 0–5)
SPECIFIC GRAVITY, URINE: 1.02 (ref 1.005–1.030)

## 2016-12-18 MED ORDER — METAXALONE 800 MG PO TABS: 800.0000 mg | ORAL_TABLET | Freq: Three times a day (TID) | ORAL | 0 refills | Status: DC

## 2016-12-18 MED ORDER — NAPROXEN 500 MG PO TABS: 500.0000 mg | ORAL_TABLET | Freq: Two times a day (BID) | ORAL | 0 refills | Status: DC

## 2016-12-18 NOTE — ED Provider Notes (Signed)
CSN: [id number]     Arrival date & time 12/18/16  1307 History   First MD Initiated Contact with Patient 12/18/16 1357     Chief Complaint  Patient presents with  . Back Pain   (Consider location/radiation/quality/duration/timing/severity/associated sxs/prior Treatment) HPI  This a 66 year old female who presents with a two-week history of right-sided mid to lower back pain. She has no history of any injury. She mostly in the morning and when changing positions in bed.It is nonradiating.She has no urinary tract symptoms. Today she has no pain at all in her back. She has no history of kidney stones. The pain is strictly located in the back without any radicular pattern. She denies any fever or chills.       Past Medical History:  Diagnosis Date  . Diabetes mellitus without complication ([place])   . Goiter   . Headache    simus  . Hypercholesteremia   . Hypertension   . Hypothyroidism   . Proteinuria   . Wears dentures    partial upper, full lower   Past Surgical History:  Procedure Laterality Date  . ABDOMINAL HYSTERECTOMY    . COLONOSCOPY WITH PROPOFOL N/A 02/09/2015   Procedure: COLONOSCOPY WITH PROPOFOL;  Surgeon: Hulen Luster, MD;  Location: [facility] ENDOSCOPY;  Service: Gastroenterology;  Laterality: N/A;  . THYROIDECTOMY     Family History  Problem Relation Age of Onset  . Breast cancer Neg Hx    Social History  Substance Use Topics  . Smoking status: Never Smoker  . Smokeless tobacco: Never Used  . Alcohol use No   OB History    No data available     Review of Systems  Constitutional: Positive for activity change. Negative for chills, fatigue and fever.  Musculoskeletal: Positive for back pain.  All other systems reviewed and are negative.   Allergies  Altace [ramipril]; Crestor [rosuvastatin]; Lipitor [atorvastatin]; Avandia [rosiglitazone]; and Doxycycline  Home Medications   Prior to Admission medications   Medication Sig Start Date End Date Taking?  Authorizing Provider  cyclobenzaprine (FLEXERIL) 5 MG tablet Take 1 tablet (5 mg total) by mouth every 8 (eight) hours as needed for muscle spasms (or pain. Do not drive or operate machinery while taking as can cause drowsiness.). 07/01/15   Marylene Land, NP  glimepiride (AMARYL) 4 MG tablet Take 4 mg by mouth daily with breakfast.    [provider]  ibuprofen (ADVIL,MOTRIN) 800 MG tablet Take 1 tablet (800 mg total) by mouth every 8 (eight) hours as needed for mild pain or moderate pain. 07/01/15   Marylene Land, NP  levothyroxine (SYNTHROID, LEVOTHROID) 150 MCG tablet Take 150 mcg by mouth daily before breakfast.    [provider]  loratadine (CLARITIN) 10 MG tablet Take 10 mg by mouth daily.    [provider]  losartan (COZAAR) 100 MG tablet Take 100 mg by mouth daily.    [provider]  meloxicam (MOBIC) 15 MG tablet Take 1 tablet (15 mg total) by mouth daily. 02/26/16   Frederich Cha, MD  metaxalone (SKELAXIN) 800 MG tablet Take 1 tablet (800 mg total) by mouth 3 (three) times daily. 12/18/16   Lorin Picket, PA-C  metFORMIN (GLUCOPHAGE) 1000 MG tablet Take 1,000 mg by mouth 2 (two) times daily with a meal.    [provider]  Multiple Vitamins-Minerals (DAILY MULTIVITAMIN PO) Take by mouth.    [provider]  naproxen (NAPROSYN) 500 MG tablet Take 1 tablet (500 mg total) by  mouth 2 (two) times daily. 12/18/16   Lorin Picket, PA-C  simvastatin (ZOCOR) 40 MG tablet Take 40 mg by mouth daily.    [provider]  sitaGLIPtin (JANUVIA) 100 MG tablet Take 100 mg by mouth daily.    [provider]  traMADol (ULTRAM) 50 MG tablet Take 1 tablet (50 mg total) by mouth every 8 (eight) hours as needed (Do not drive or operate machinery while taking as can cause drowsiness.). 07/20/15   Marylene Land, NP   Meds Ordered and Administered this Visit  Medications - No data to display  BP (!) 127/92 (BP Location: Left Arm)    Pulse 73   Temp 98.6 F (37 C) (Oral)   Resp 18   Ht 5\' 7"  (1.702 m)   Wt 200 lb (90.7 kg)   SpO2 100%   BMI 31.32 kg/m  No data found.   Physical Exam  Constitutional: She is oriented to person, place, and time. She appears well-developed and well-nourished. No distress.  HENT:  Head: Normocephalic.  Eyes: Pupils are equal, round, and reactive to light. Right eye exhibits no discharge. Left eye exhibits no discharge.  Neck: Normal range of motion. Neck supple.  Musculoskeletal: Normal range of motion. She exhibits tenderness.  Examination of the lumbar spine shows a level pelvis in stance. Patient is able to forward flex and return to upright posture with only mild discomfort in the right paraspinous muscles. Lateral flexion to left increases her pain. Lateral flexion to the right is comfortable. Thoracic rotation also causes her to have discomfort in the lumbar paraspinous muscles. Patient has normal sensation distally. Straight leg raise testing in sitting position 90 is normal.  Neurological: She is alert and oriented to person, place, and time.  Skin: Skin is warm and dry. She is not diaphoretic.  Psychiatric: She has a normal mood and affect. Her behavior is normal. Judgment and thought content normal.  Nursing note and vitals reviewed.   Urgent Care Course     Procedures (including critical care time)  Labs Review Labs Reviewed  URINALYSIS, COMPLETE (UACMP) WITH MICROSCOPIC - Abnormal; Notable for the following:       Result Value   Squamous Epithelial / LPF 0-5 (*)    Bacteria, UA RARE (*)    All other components within normal limits    Imaging Review No results found.   Visual Acuity Review  Right Eye Distance:   Left Eye Distance:   Bilateral Distance:    Right Eye Near:   Left Eye Near:    Bilateral Near:         MDM   1. Acute right-sided low back pain without sciatica    Discharge Medication List as of 12/18/2016  2:10 PM    START taking  these medications   Details  metaxalone (SKELAXIN) 800 MG tablet Take 1 tablet (800 mg total) by mouth 3 (three) times daily., Starting Thu 12/18/2016, Normal    naproxen (NAPROSYN) 500 MG tablet Take 1 tablet (500 mg total) by mouth 2 (two) times daily., Starting Thu 12/18/2016, Normal      Plan: 1. Test/x-ray results and diagnosis reviewed with patient 2. rx as per orders; risks, benefits, potential side effects reviewed with patient 3. Recommend supportive treatment with Rest and symptom avoidance. I've cautioned her regarding the use of Skelaxin with activities requiring concentration and judgment she should not drive taking them. I recommended that she follow-up with Dr. Kathalene Frames her primary care physician next week.  4. F/u prn if symptoms worsen or don't improve     Lorin Picket, PA-C 12/18/16 1416

## 2016-12-18 NOTE — ED Triage Notes (Signed)
Pt with 2 weeks of right low back pain, worse in the morning and worse when changing positions in bed. Non-radiating. No urinary sx. Pain 0/10 currently.

## 2017-09-29 ENCOUNTER — Other Ambulatory Visit: Payer: Self-pay | Admitting: Internal Medicine

## 2017-09-29 DIAGNOSIS — Z1231 Encounter for screening mammogram for malignant neoplasm of breast: Secondary | ICD-10-CM

## 2017-10-01 ENCOUNTER — Inpatient Hospital Stay: Admission: RE | Admit: 2017-10-01 | Payer: Medicare HMO | Source: Ambulatory Visit

## 2017-10-05 ENCOUNTER — Ambulatory Visit
Admission: RE | Admit: 2017-10-05 | Discharge: 2017-10-05 | Disposition: A | Discharge status: Home / Self Care | Payer: Medicare HMO | Source: Ambulatory Visit | Attending: Internal Medicine | Admitting: Internal Medicine

## 2017-10-05 ENCOUNTER — Encounter (INDEPENDENT_AMBULATORY_CARE_PROVIDER_SITE_OTHER): Payer: Self-pay

## 2017-10-05 DIAGNOSIS — Z1231 Encounter for screening mammogram for malignant neoplasm of breast: Secondary | ICD-10-CM | POA: Insufficient documentation

## 2017-12-05 ENCOUNTER — Other Ambulatory Visit: Payer: Self-pay

## 2017-12-05 ENCOUNTER — Ambulatory Visit
Admission: EM | Admit: 2017-12-05 | Discharge: 2017-12-05 | Disposition: A | Discharge status: Home / Self Care | Payer: Medicare HMO

## 2017-12-05 DIAGNOSIS — R21 Rash and other nonspecific skin eruption: Secondary | ICD-10-CM

## 2017-12-05 NOTE — ED Triage Notes (Signed)
Pt states she has been itching on her arms all week and sometimes on her legs. Fine bumps on arms.

## 2017-12-05 NOTE — Discharge Instructions (Addendum)
Suspect rash is from possibly sun exposure or new sunscreen started this week.   Reasonable to start Zantac and Zyrtec as discussed.   If no improvement, certainly any new, worsening features, please return for evaluation.   Hives of unknown cause  Tips on Aggravating factors --    These include:  ?Physical factors -  As an example, heat (hot showers, extreme humidity) is a common trigger for many , and tight clothing or straps can also aggravate symptoms.   ?Anti-inflammatory medications - Nonsteroidal anti-inflammatory drugs (NSAIDs) worsen symptoms   ?Stress - Patients often report more severe symptoms during periods of physical or psychologic stress   ?Variations in dietary habits and alcohol - Although food allergy is a rare cause of, some patients will report that variations in diet, particularly rich meals or spicy foods, will aggravate symptoms. Alcohol also aggravates symptoms in some.  If hives do not improve, please let me know and remember :   Histamine 1 blocker - Either benadryl every 8 hours (may be different dosing/duration depending on formulation, please follow directions on bottle)                                         OR  claritin or zyrtec once daily  Histamine 2 blocker- Pepcid AC or Zantac twice a day

## 2017-12-05 NOTE — ED Provider Notes (Signed)
[facility]    CSN: [id number] Arrival date & time: 12/05/17  1505     History   Chief Complaint Chief Complaint  Patient presents with  . Pruritis    HPI Caitlin Ford is a 67 y.o. female.   CC: itching all over, mostly arms and legs, x one week, worsening.  Small bumps noted on arms. Not draining, fever, sob, trouble swallowing, drooling, cough.   Itching keep her up at night. Using benadryl with some relief. Using triamcinolone 0.1%  without relief.  No new medications. Notes just started using sunscreen this week- stopped using 2 days ago       Past Medical History:  Diagnosis Date  . Diabetes mellitus without complication ([place])   . Goiter   . Headache    simus  . Hypercholesteremia   . Hypertension   . Hypothyroidism   . Proteinuria   . Wears dentures    partial upper, full lower    There are no active problems to display for this patient.   Past Surgical History:  Procedure Laterality Date  . ABDOMINAL HYSTERECTOMY    . COLONOSCOPY WITH PROPOFOL N/A 02/09/2015   Procedure: COLONOSCOPY WITH PROPOFOL;  Surgeon: Hulen Luster, MD;  Location: [facility] ENDOSCOPY;  Service: Gastroenterology;  Laterality: N/A;  . THYROIDECTOMY      OB History   None      Home Medications    Prior to Admission medications   Medication Sig Start Date End Date Taking? Authorizing Provider  cyclobenzaprine (FLEXERIL) 5 MG tablet Take 1 tablet (5 mg total) by mouth every 8 (eight) hours as needed for muscle spasms (or pain. Do not drive or operate machinery while taking as can cause drowsiness.). 07/01/15   Marylene Land, NP  glimepiride (AMARYL) 4 MG tablet Take 4 mg by mouth daily with breakfast.    [provider]  ibuprofen (ADVIL,MOTRIN) 800 MG tablet Take 1 tablet (800 mg total) by mouth every 8 (eight) hours as needed for mild pain or moderate pain. 07/01/15   Marylene Land, NP  levothyroxine (SYNTHROID, LEVOTHROID) 150 MCG tablet Take 150 mcg by  mouth daily before breakfast.    [provider]  loratadine (CLARITIN) 10 MG tablet Take 10 mg by mouth daily.    [provider]  losartan (COZAAR) 100 MG tablet Take 100 mg by mouth daily.    [provider]  meloxicam (MOBIC) 15 MG tablet Take 1 tablet (15 mg total) by mouth daily. 02/26/16   Frederich Cha, MD  metaxalone (SKELAXIN) 800 MG tablet Take 1 tablet (800 mg total) by mouth 3 (three) times daily. 12/18/16   Lorin Picket, PA-C  metFORMIN (GLUCOPHAGE) 1000 MG tablet Take 1,000 mg by mouth 2 (two) times daily with a meal.    [provider]  Multiple Vitamins-Minerals (DAILY MULTIVITAMIN PO) Take by mouth.    [provider]  naproxen (NAPROSYN) 500 MG tablet Take 1 tablet (500 mg total) by mouth 2 (two) times daily. 12/18/16   Lorin Picket, PA-C  simvastatin (ZOCOR) 40 MG tablet Take 40 mg by mouth daily.    [provider]  sitaGLIPtin (JANUVIA) 100 MG tablet Take 100 mg by mouth daily.    [provider]  traMADol (ULTRAM) 50 MG tablet Take 1 tablet (50 mg total) by mouth every 8 (eight) hours as needed (Do not drive or operate machinery while taking as can cause drowsiness.). 07/20/15   Marylene Land, NP  Family History Family History  Problem Relation Age of Onset  . Breast cancer Neg Hx     Social History Social History   Tobacco Use  . Smoking status: Never Smoker  . Smokeless tobacco: Never Used  Substance Use Topics  . Alcohol use: No  . Drug use: No     Allergies   Altace [ramipril]; Crestor [rosuvastatin]; Lipitor [atorvastatin]; Avandia [rosiglitazone]; and Doxycycline   Review of Systems Review of Systems  Constitutional: Negative for chills and fever.  HENT: Negative for sore throat and trouble swallowing.   Respiratory: Negative for cough, shortness of breath and wheezing.   Cardiovascular: Negative for chest pain and palpitations.  Gastrointestinal: Negative for nausea and  vomiting.  Skin: Positive for rash. Negative for wound.     Physical Exam Triage Vital Signs ED Triage Vitals  Enc Vitals Group     BP 12/05/17 1516 (!) 158/87     Pulse Rate 12/05/17 1516 75     Resp 12/05/17 1516 18     Temp 12/05/17 1516 98.4 F (36.9 C)     Temp Source 12/05/17 1516 Oral     SpO2 12/05/17 1516 100 %     Weight 12/05/17 1518 200 lb (90.7 kg)     Height 12/05/17 1518 5\' 7"  (1.702 m)     Head Circumference --      Peak Flow --      Pain Score 12/05/17 1518 0     Pain Loc --      Pain Edu? --      Excl. in [place]? --    No data found.  Updated Vital Signs BP (!) 158/87 (BP Location: Left Arm)   Pulse 75   Temp 98.4 F (36.9 C) (Oral)   Resp 18   Ht 5\' 7"  (1.702 m)   Wt 200 lb (90.7 kg)   SpO2 100%   BMI 31.32 kg/m   Visual Acuity Right Eye Distance:   Left Eye Distance:   Bilateral Distance:    Right Eye Near:   Left Eye Near:    Bilateral Near:     Physical Exam  Constitutional: She appears well-developed and well-nourished.  Eyes: Conjunctivae are normal.  Cardiovascular: Normal rate, regular rhythm, normal heart sounds and normal pulses.  Pulmonary/Chest: Effort normal and breath sounds normal. She has no wheezes. She has no rhonchi. She has no rales.  Neurological: She is alert.  Skin: Skin is warm and dry.  Diffuse, fine maculopapular rash noted bilateral arms from wrist to deltoid.  See attached picture  Psychiatric: She has a normal mood and affect. Her speech is normal and behavior is normal. Thought content normal.  Vitals reviewed.       UC Treatments / Results  Labs (all labs ordered are listed, but only abnormal results are displayed) Labs Reviewed - No data to display  EKG None  Radiology No results found.  Procedures Procedures (including critical care time)  Medications Ordered in UC Medications - No data to display  Initial Impression / Assessment and Plan / UC Course  I have reviewed the triage vital signs  and the nursing notes.  Pertinent labs & imaging results that were available during my care of the patient were reviewed by me and considered in my medical decision making (see chart for details).      Final Clinical Impressions(s) / UC Diagnoses   Final diagnoses:  Rash and nonspecific skin eruption  Etiology of rash nonspecific at this time.  Patient is well-appearing.  We jointly agreed the onset of rash coincides with starting new sunscreen.  Maculopapular lesions primarily noted on her forearm, up to where a T-shirt would end over deltoid.  The lesions are not spreading.  No systemic features.  Jointly agreed to trial of full histamine blockade with Zantac, Zyrtec is appropriate at this time. Return precautions given.    Discharge Instructions     Suspect rash is from possibly sun exposure or new sunscreen started this week.   Reasonable to start Zantac and Zyrtec as discussed.   If no improvement, certainly any new, worsening features, please return for evaluation.   Hives of unknown cause  Tips on Aggravating factors -    These include:  ?Physical factors -  As an example, heat (hot showers, extreme humidity) is a common trigger for many , and tight clothing or straps can also aggravate symptoms.   ?Anti-inflammatory medications - Nonsteroidal anti-inflammatory drugs (NSAIDs) worsen symptoms   ?Stress - Patients often report more severe symptoms during periods of physical or psychologic stress   ?Variations in dietary habits and alcohol - Although food allergy is a rare cause of, some patients will report that variations in diet, particularly rich meals or spicy foods, will aggravate symptoms. Alcohol also aggravates symptoms in some.  If hives do not improve, please let me know and remember :   Histamine 1 blocker - Either benadryl every 8 hours (may be different dosing/duration depending on formulation, please follow directions on bottle)                                          OR  claritin or zyrtec once daily  Histamine 2 blocker- Pepcid AC or Zantac twice a day     ED Prescriptions    None     Controlled Substance Prescriptions [place] Controlled Substance Registry consulted? Not Applicable   Burnard Hawthorne, FNP 12/05/17 1726

## 2018-08-16 ENCOUNTER — Other Ambulatory Visit: Payer: Self-pay | Admitting: Internal Medicine

## 2018-08-16 DIAGNOSIS — Z1231 Encounter for screening mammogram for malignant neoplasm of breast: Secondary | ICD-10-CM

## 2018-11-25 ENCOUNTER — Ambulatory Visit
Admission: RE | Admit: 2018-11-25 | Discharge: 2018-11-25 | Disposition: A | Discharge status: Home / Self Care | Payer: Medicare HMO | Source: Ambulatory Visit | Attending: Internal Medicine | Admitting: Internal Medicine

## 2018-11-25 ENCOUNTER — Other Ambulatory Visit: Payer: Self-pay

## 2018-11-25 ENCOUNTER — Encounter (INDEPENDENT_AMBULATORY_CARE_PROVIDER_SITE_OTHER): Payer: Self-pay

## 2018-11-25 DIAGNOSIS — Z1231 Encounter for screening mammogram for malignant neoplasm of breast: Secondary | ICD-10-CM | POA: Diagnosis present

## 2018-12-01 ENCOUNTER — Other Ambulatory Visit: Payer: Self-pay | Admitting: Internal Medicine

## 2018-12-01 DIAGNOSIS — R928 Other abnormal and inconclusive findings on diagnostic imaging of breast: Secondary | ICD-10-CM

## 2018-12-02 ENCOUNTER — Other Ambulatory Visit: Payer: Self-pay

## 2018-12-02 ENCOUNTER — Ambulatory Visit
Admission: RE | Admit: 2018-12-02 | Discharge: 2018-12-02 | Disposition: A | Discharge status: Home / Self Care | Payer: Medicare HMO | Source: Ambulatory Visit | Attending: Internal Medicine | Admitting: Internal Medicine

## 2018-12-02 DIAGNOSIS — R928 Other abnormal and inconclusive findings on diagnostic imaging of breast: Secondary | ICD-10-CM

## 2018-12-09 ENCOUNTER — Other Ambulatory Visit: Payer: Self-pay | Admitting: Internal Medicine

## 2018-12-09 DIAGNOSIS — R928 Other abnormal and inconclusive findings on diagnostic imaging of breast: Secondary | ICD-10-CM

## 2018-12-14 ENCOUNTER — Other Ambulatory Visit: Payer: Self-pay

## 2018-12-14 ENCOUNTER — Ambulatory Visit
Admission: RE | Admit: 2018-12-14 | Discharge: 2018-12-14 | Disposition: A | Discharge status: Home / Self Care | Payer: Medicare HMO | Source: Ambulatory Visit | Attending: Internal Medicine | Admitting: Internal Medicine

## 2018-12-14 DIAGNOSIS — R928 Other abnormal and inconclusive findings on diagnostic imaging of breast: Secondary | ICD-10-CM | POA: Insufficient documentation

## 2018-12-14 HISTORY — PX: BREAST BIOPSY: SHX20

## 2018-12-17 ENCOUNTER — Other Ambulatory Visit: Payer: Self-pay

## 2018-12-17 DIAGNOSIS — C50919 Malignant neoplasm of unspecified site of unspecified female breast: Secondary | ICD-10-CM

## 2018-12-17 NOTE — Progress Notes (Signed)
  Oncology Nurse Navigator Documentation  Navigator Location: [facility] (12/17/18 1100) Referral Date to RadOnc/MedOnc: 12/24/18 (12/17/18 1100) )Navigator Encounter Type: Introductory Phone Call (12/17/18 1100)   Abnormal Finding Date: 12/02/18 (12/17/18 1100) Confirmed Diagnosis Date: 12/14/18 (12/17/18 1100)               Patient Visit Type: Initial (12/17/18 1100)   Barriers/Navigation Needs: Coordination of Care;Education (12/17/18 1100)   Interventions: Coordination of Care (12/17/18 1100)   Coordination of Care: Appts (12/17/18 1100)                  Time Spent with Patient: 30 (12/17/18 1100)   Introduced patient navigation. Scheduled Med/Onc, and Surgical consults. No family history of cancer.  Patient works per diem at [employer].

## 2018-12-20 ENCOUNTER — Other Ambulatory Visit: Payer: Self-pay | Admitting: Specialist

## 2018-12-20 LAB — SURGICAL PATHOLOGY

## 2018-12-22 ENCOUNTER — Other Ambulatory Visit: Payer: Self-pay | Admitting: Surgery

## 2018-12-22 DIAGNOSIS — C50212 Malignant neoplasm of upper-inner quadrant of left female breast: Secondary | ICD-10-CM

## 2018-12-22 DIAGNOSIS — Z170 Estrogen receptor positive status [ER+]: Secondary | ICD-10-CM

## 2018-12-23 ENCOUNTER — Other Ambulatory Visit: Payer: Self-pay | Admitting: Surgery

## 2018-12-23 ENCOUNTER — Other Ambulatory Visit: Payer: Self-pay

## 2018-12-23 ENCOUNTER — Ambulatory Visit: Payer: Self-pay | Admitting: Surgery

## 2018-12-23 DIAGNOSIS — Z170 Estrogen receptor positive status [ER+]: Secondary | ICD-10-CM

## 2018-12-23 DIAGNOSIS — C50212 Malignant neoplasm of upper-inner quadrant of left female breast: Secondary | ICD-10-CM

## 2018-12-23 NOTE — Progress Notes (Signed)
[facility]  [address] [place], [place] [address] Phone: [phone]  Fax: [phone]   Clinic Day:  12/24/2018  Referring physician: Ezequiel Kayser, MD  Chief Complaint: Caitlin Ford is a 68 y.o. female with invasive mammary carcinoma who is referred in consultation by Dr. [name] for assessment and management.   HPI: The patient undergoes yearly mammograms (last 10/05/2017).  Bilateral screening mammogram on 11/25/2018 revealed a possible distortion in the left breast. Bilateral diagnostic mammogram on 12/02/2018 revealed a persistent left breast distortion in the upper inner quadrant, middle depth. The mass was not palpable prior to diagnosis.   Ultrasound guided left breast biopsy with clip placement on 12/14/2018 revealed a 7 mm grade I invasive mammary carcinoma. ER + >90%, PR + >90%, and HER-2/neu negative (1+).   Bone density on 10/21/2018 was normal with a T-score of +0.3 in the left femoral neck and + 1.3 in AP spine L1-L4.   She was seen in consultation by Dr. Lysle Pearl on 12/22/2018.  Left partial mastectomy and SLN biopsy were discussed.  Menarche at 68yo. She had a hysterectomy following heavy menses. She is G2P2. She has 2 children, one of whom has MS (50yo). She denies breastfeeding either child. She denies hormonal birth control use or hormone replacement. She denies any family history of breast, ovarian, or pancreatic cancer. She does not perform monthly breast exams.   Labs on 12/22/2018: hematocrit 35.2, hemoglobin 11.2, MCV 87.6, platelets 205,000, WBC 5,200.   Symptomatically, she denies any fevers, fatigue, sweats, or weight loss. She denies any headaches or visual changes. She denies any runny nose, cough, shortness of breath, or chest pain. She denies any nausea, vomiting, or diarrhea. She denies any blood in her stools, black stools, or blood in her urine. She notes bilateral knee pain that both "pop" when she stands after  sitting for a while. She denies any pain or discomfort at the biopsy site.   Lumpectomy is scheduled with Dr. Lysle Pearl on 01/14/2019.    Past Medical History:  Diagnosis Date  . Breast cancer ([place])   . Diabetes mellitus without complication ([place])   . Goiter   . Headache    simus  . Hypercholesteremia   . Hypertension   . Hypothyroidism   . Proteinuria   . Wears dentures    partial upper, full lower    Past Surgical History:  Procedure Laterality Date  . ABDOMINAL HYSTERECTOMY    . BREAST BIOPSY Left 12/14/2018   Affirm Bx- Ribbon clip- Path pending  . COLONOSCOPY WITH PROPOFOL N/A 02/09/2015   Procedure: COLONOSCOPY WITH PROPOFOL;  Surgeon: Hulen Luster, MD;  Location: [facility] ENDOSCOPY;  Service: Gastroenterology;  Laterality: N/A;  . THYROIDECTOMY      Family History  Problem Relation Age of Onset  . Diabetes Mother   . Diabetes Maternal Aunt   . Diabetes Maternal Uncle   . Breast cancer Neg Hx     Social History:  reports that she has never smoked. She has never used smokeless tobacco. She reports that she does not drink alcohol or use drugs.She does not smoke. She drinks wine occasionally. She denies any known exposure to radiation or toxins. 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 She has two children. She lives in [place]. The patient is alone today.  Allergies:  Allergies  Allergen Reactions  . Altace [Ramipril] Other (See Comments)    Angioedema  . Crestor [Rosuvastatin] Other (  See Comments)    Muscle ache  . Lipitor [Atorvastatin] Other (See Comments)    Muscle ache  . Avandia [Rosiglitazone] Other (See Comments)     Muscle Aches  . Doxycycline Other (See Comments)    Muscle Aches    Current Medications: Current Outpatient Medications  Medication Sig Dispense Refill  . acetaminophen (TYLENOL) 650 MG CR tablet Take 650 mg by mouth every 8 (eight) hours as needed.     [name] aspirin EC 81 MG tablet Take 81 mg by mouth daily.      . cetirizine (ZYRTEC) 10 MG tablet Take 10 mg by mouth as needed.     . fluticasone (FLONASE) 50 MCG/ACT nasal spray Place 1 spray into both nostrils as needed.     [name] glimepiride (AMARYL) 4 MG tablet Take 4 mg by mouth daily with breakfast.    . levothyroxine (SYNTHROID) 125 MCG tablet Take 125 mcg by mouth daily before breakfast.     . losartan (COZAAR) 100 MG tablet Take 100 mg by mouth daily.    . metFORMIN (GLUCOPHAGE-XR) 500 MG 24 hr tablet Take 500 mg by mouth 4 (four) times daily.     . Multiple Vitamin (MULTI-VITAMIN) tablet Take 1 tablet by mouth daily.     . simvastatin (ZOCOR) 40 MG tablet Take 40 mg by mouth daily.     No current facility-administered medications for this visit.     Review of Systems  Constitutional: Negative.  Negative for chills, diaphoresis, fever, malaise/fatigue and weight loss.       Dong well. Energy level good.  HENT: Negative.  Negative for congestion, hearing loss, sinus pain and sore throat.   Eyes: Negative.  Negative for blurred vision.  Respiratory: Negative.  Negative for cough, shortness of breath and wheezing.   Cardiovascular: Negative.  Negative for chest pain, palpitations, orthopnea, leg swelling and PND.  Gastrointestinal: Negative.  Negative for abdominal pain, blood in stool, constipation, diarrhea, melena, nausea and vomiting.  Genitourinary: Negative.  Negative for dysuria, frequency, hematuria and urgency.  Musculoskeletal: Positive for joint pain (bilateral knee pain). Negative for back pain and myalgias.  Skin: Negative.  Negative for rash.  Neurological: Negative.  Negative for dizziness, tingling, sensory change, weakness and headaches.  Endo/Heme/Allergies: Negative.  Does not bruise/bleed easily.  Psychiatric/Behavioral: Negative.  Negative for depression, memory loss and substance abuse. The patient is not nervous/anxious and does not have insomnia.   All other systems reviewed and are negative.  Performance status (ECOG):  0  Vitals Blood pressure (!) 143/82, pulse 81, temperature 98.4 F (36.9 C), temperature source Oral, resp. rate 16, height '5\' 7"'  (1.702 m), weight 193 lb 9 oz (87.8 kg), SpO2 100 %.   Physical Exam  Constitutional: She is oriented to person, place, and time. She appears well-developed and well-nourished. No distress.  HENT:  Head: Normocephalic and atraumatic.  Mouth/Throat: Oropharynx is clear and moist. No oropharyngeal exudate.  Short black hair. Mask.   Eyes: Pupils are equal, round, and reactive to light. Conjunctivae and EOM are normal. No scleral icterus.  Brown eyes. Glasses.  Neck: Normal range of motion. Neck supple. No JVD present.  Cardiovascular: Normal rate, regular rhythm and normal heart sounds.  No murmur heard. Pulmonary/Chest: Breath sounds normal. No respiratory distress. She has no wheezes. She has no rales. Right breast exhibits no inverted nipple, no mass, no nipple discharge, no skin change and no tenderness. Left breast exhibits no inverted nipple, no mass (biopsy changes 12:00), no nipple  discharge, no skin change and no tenderness.  Abdominal: Soft. Bowel sounds are normal. She exhibits no distension and no mass. There is no abdominal tenderness. There is no rebound and no guarding.  Musculoskeletal: Normal range of motion.        General: No edema.  Lymphadenopathy:    She has no cervical adenopathy.    She has no axillary adenopathy.       Right: No supraclavicular adenopathy present.       Left: No supraclavicular adenopathy present.  Neurological: She is alert and oriented to person, place, and time.  Skin: Skin is warm and dry. No rash noted. She is not diaphoretic. No erythema.  Psychiatric: She has a normal mood and affect. Her behavior is normal. Judgment and thought content normal.  Nursing note and vitals reviewed.   No visits with results within 3 Day(s) from this visit.  Latest known visit with results is:  Hospital Outpatient Visit on  12/14/2018  Component Date Value Ref Range Status  . SURGICAL PATHOLOGY 12/14/2018    Final-Edited                   Value:Surgical Pathology THIS IS AN ADDENDUM REPORT CASE: [id number] PATIENT: Caitlin Ford Surgical Pathology Report Addendum  Reason for Addendum #1:  Breast Biomarker Results Reason for Addendum #2:  Clerical  SPECIMEN SUBMITTED: A. Breast, left  CLINICAL HISTORY: Distortion  PRE-OPERATIVE DIAGNOSIS: IMC, CSL  POST-OPERATIVE DIAGNOSIS: None provided.  DIAGNOSIS: A. Breast, left, slightly upper inner quadrant; stereotactic core biopsies: -  INVASIVE MAMMARY CARCINOMA, no special type.  Size of invasive carcinoma: 7 mm in this sample Histologic grade of invasive carcinoma: Grade 1                      Glandular/tubular differentiation score: 1                      Nuclear pleomorphism score: 2                      Mitotic rate score: 1                      Total score: 4 Ductal carcinoma in situ: Not identified Lymphovascular invasion: Not identified  ER/PR/HER2: Immunohistochemistry will be performed on block A2, wit                         h reflex to [place] for HER2 2+. The results will be reported in an addendum. Findings are reported to Dannial Monarch in Dr. Janace Hoard office on 12/15/2018.  GROSS DESCRIPTION: A. Labeled: Left breast upper inner quadrant Received: in a formalin-filled [place] collection device Accompanying specimen radiograph: No Time/Date in fixative: Collected at 1:18 PM and placed into formalin at 1:20 PM on 12/14/2018. Cold ischemic time: Less than 5 minutes Total fixation time: 8 hours Core pieces: Multiple Measurement: Aggregate, 4.5 x 1.5 x 0.3 cm Description / comments: Received are needle core biopsy fragments of tan-yellow, fibrofatty tissue Inked: Green Entirely submitted in cassette(s): 1-5  Final Diagnosis performed by Raynelle Bring, MD.   Electronically signed 12/15/2018 3:36:10PM The electronic  signature indicates that the named Attending Pathologist has evaluated the specimen  Technical component performed at [facility], [address] [place], [place] [address] Lab: [phone] Dir: Rush Farmer, MD, MMM  Professional component performed at [facility], [facility], [place], [place], [place] [address] Lab: [phone] Dir: Dellia Nims. Rubinas, MD  ADDENDUM: BREAST BIOMARKER TESTS Estrogen Receptor (ER) Status: POSITIVE                      Percentage of cells with nuclear positivity: Greater than 90%                      Average intensity of staining: Strong  Progesterone Receptor (PgR) Status: POSITIVE                      Percentage of cells with nuclear positivity: Greater than 90%                      Average intensity of staining: Moderate  HER2 (by immunohistochemistry): NEGATIVE (Score 1+)  Cold Ischemia and Fixation Times: Meet requirements specified in latest version of the ASCO/CAP guidelines Testing Performed on Block Number(s): A2  METHODS Fixative: Formalin Estrogen Receptor:  FDA cleared ([place]) Primary Antibody:  SP1 Progesterone Receptor: FDA cleared Michael Litter) Primary An                         tibody: 1E2 HER2 (by IHC): FDA cleared ([place]) Primary Antibody: 4B5 ([facility]) Immunohistochemistry controls worked appropriately. Slides were prepared by [facility], [place], [place], and interpreted by Dr. Rodney Cruise.  This test was developed and its performance characteristics determined by [facility]. It has not been cleared or approved by the [place] Food and Drug Administration. The FDA does not require this test to go through premarket FDA review. This test is used for clinical purposes. It should not be regarded as investigational or for research. This laboratory is certified under the Clinical Laboratory Improvement Amendments (CLIA) as qualified to perform high complexity clinical laboratory testing.   Addendum #1 performed by Raynelle Bring, MD.   Electronically signed 12/20/2018 4:12:07PM The electronic signature indicates that the named Attending Pathologist has evaluated the specimen  Technical component performed at [facility], [address] [place], [place] [address] Lab: [phone] Dir: Rush Farmer, MD, MMM  Professional component performed at [facility], [facility], [place], [place], [place] [address] Lab: [phone] Dir: Dellia Nims. Rubinas, MD  ADDENDUM: Comment Ribbon shaped biopsy clip was deployed post biopsy.    Addendum #2 performed by Raynelle Bring, MD.   Electronically signed 12/20/2018 4:35:32PM The electronic signature indicates that the named Attending Pathologist has evaluated the specimen  Technical component performed at [facility], [address], [place], [place] [address] Lab: [phone] Dir: Rush Farmer, MD, MMM  Professional component performed at [facility], [facility], [place], [place], [place] [address] Lab: [phone] Dir: Dellia Nims. Reuel Derby, MD     Assessment:  Lennyx Verdell is a 68 y.o. female with clinical stage I left breast cancer s/p biopsy on 12/14/2018.  Pathology revealed a 7 mm grade I invasive mammary carcinoma. ER + >90%, PR + >90%, and HER-2/neu negative (1+).   Bilateral screening mammogram on 11/25/2018 revealed a possible distortion in the left breast. Bilateral diagnostic mammogram on 12/02/2018 revealed a persistent left breast distortion in the upper inner quadrant, middle depth.   Bone  density on 10/21/2018 was normal with a T-score of +0.3 in the left femoral neck and + 1.3 in AP spine L1-L4.   Symptomatically, she denies any complaints.  Exam reveals biopsy changes in the left breast.  Plan: 1.   Labs today: liver function tests, CA27.29. 2.   Clinical stage I left breast cancer  Discuss surgical resection (lumpectomy and SLN biopsy with Dr. Lysle Pearl)              Discuss radiation treatment. Plan consultation with Dr. Baruch Gouty.   Discuss OncotypeDx testing to determine chemotherapy benefit.    Given small grade I tumor, suspect low risk Oncotype DX and treatment with endocrine therapy alone.             Discuss plan for adjuvant endocrine therapy.   Discuss tamoxifen versus aromatase inhibitors.   Information provided on letrozole and tamoxifen. 3.   Consult radiation oncology. 4.   RTC 2 weeks following surgery for MD assessment, review of pathology, and finalization of treatment plan.   I discussed the assessment and treatment plan with the patient.  The patient was provided an opportunity to ask questions and all were answered.  The patient agreed with the plan and demonstrated an understanding of the instructions.  The patient was advised to call back if the symptoms worsen or if the condition fails to improve as anticipated.    C. Mike Gip, MD, PhD    12/24/2018, 10:58 AM  I, Molly Dorshimer, am acting as Education administrator for [employer]. Mike Gip, MD, PhD.  I,  C. Mike Gip, MD, have reviewed the above documentation for accuracy and completeness, and I agree with the above.

## 2018-12-23 NOTE — H&P (View-Only) (Signed)
Subjective:   CC: Malignant neoplasm of upper-inner quadrant of left breast in female, estrogen receptor positive (CMS-HCC) [C50.212, Z17.0] HPI:  Caitlin Ford is a 68 y.o. female who was referred by Caitlin Newcomb Thies, MD for evaluation of above. Change was noted on last screening mammogram. Patient does not routinely do self breast exams.  Patient has not noted a change on breast exam. Age of menarche was 12.  Patient denies hormonal therapy. Patient is G2P2.  Patient denies nipple discharge. Patient denies previous breast biopsy. Patient denies a personal history of breast cancer.   Past Medical History:  has a past medical history of History of goiter, Hyperlipidemia associated with type 2 diabetes mellitus (CMS-HCC), Hyperplastic colon polyp (09/13/2004), Hypertension associated with type 2 diabetes mellitus (CMS-HCC), Hypothyroidism, postsurgical, Proteinuria, Seasonal allergic rhinitis, and Type 2 diabetes mellitus with renal manifestations, controlled (CMS-HCC).  Past Surgical History:  has a past surgical history that includes thyroidectomy total (Bilateral, 1971); Abdominal hysterectomy (05/1999); Colonoscopy (09/13/2004); Colonoscopy (02/09/2015); and percutaneous biopsy breast (Left, 12/14/2018).  Family History: family history includes Diabetes type II in her mother; Stroke in her father; Thyroid disease in her daughter and son.  Social History:  reports that she has never smoked. She has never used smokeless tobacco. She reports that she does not drink alcohol or use drugs.  Current Medications: has a current medication list which includes the following prescription(s): acetaminophen, aspirin, cetirizine, fluticasone propionate, glimepiride, glimepiride, levothyroxine, losartan, metformin, multivitamin, simvastatin, and famotidine.  Allergies:       Allergies as of 12/22/2018 - Reviewed 12/22/2018  Allergen Reaction Noted  . Altace [ramipril] Angioedema   . Avandia  [rosiglitazone] Other (See Comments) 11/13/2013  . Crestor [rosuvastatin] Muscle Pain 11/13/2013  . Lipitor [atorvastatin] Muscle Pain 11/13/2013  . Doxycycline Unknown     ROS:  A 15 point review of systems was performed and was negative except as noted in HPI   Objective:   BP (!) 152/95   Pulse 80   Ht 170.2 cm (5' 7.01")   Wt 88.3 kg (194 lb 9.6 oz)   BMI 30.47 kg/m   Constitutional :  alert, appears stated age, cooperative and no distress  Lymphatics/Throat:  no asymmetry, masses, or scars  Respiratory:  clear to auscultation bilaterally  Cardiovascular:  regular rate and rhythm  Gastrointestinal: soft, non-tender; bowel sounds normal; no masses,  no organomegaly.   Musculoskeletal: Steady gait and movement  Skin: Cool and moist  Psychiatric: Normal affect, non-agitated, not confused  Breast:  Chaperone present for exam.  breasts appear normal, no suspicious masses, no skin or nipple changes or axillary nodes.    LABS:  SURGICAL PATHOLOGY Surgical Pathology CASE: [id number] PATIENT: Caitlin Ford Surgical Pathology Report Reason for Addendum #1: Breast Biomarker Results Reason for Addendum #2: Clerical  SPECIMEN SUBMITTED: A. Breast, left  CLINICAL HISTORY: Distortion  PRE-OPERATIVE DIAGNOSIS: IMC, CSL  POST-OPERATIVE DIAGNOSIS: None provided.     DIAGNOSIS: A. Breast, left, slightly upper inner quadrant; stereotactic core biopsies: - INVASIVE MAMMARY CARCINOMA, no special type.  Size of invasive carcinoma: 7 mm in this sample Histologic grade of invasive carcinoma: Grade 1            Glandular/tubular differentiation score: 1            Nuclear pleomorphism score: 2            Mitotic rate score: 1            Total score: 4 Ductal carcinoma in   situ: Not identified Lymphovascular invasion: Not identified  ER/PR/HER2: Immunohistochemistry will be performed on block A2, with reflex to FISH for HER2  2+. The results will be reported in an addendum. Findings are reported to Caitlin Ford in Dr. Dimitrova-Koutleva's office on 12/15/2018.   GROSS DESCRIPTION: A. Labeled: Left breast upper inner quadrant Received: in a formalin-filled Brevera collection device Accompanying specimen radiograph: No Time/Date in fixative: Collected at 1:18 PM and placed into formalin at 1:20 PM on 12/14/2018. Cold ischemic time: Less than 5 minutes Total fixation time: 8 hours Core pieces: Multiple Measurement: Aggregate, 4.5 x 1.5 x 0.3 cm Description / comments: Received are needle core biopsy fragments of tan-yellow, fibrofatty tissue Inked: Green Entirely submitted in cassette(s): 1-5   Final Diagnosis performed by Caitlin Andree, MD.  Electronically signed 12/15/2018 3:36:10PM The electronic signature indicates that the named Attending Pathologist has evaluated the specimen  Technical component performed at [facility], [address], [place], [place] [address] Lab: [phone] Dir: Caitlin Nagendra, MD, MMM Professional component performed at [facility], [facility], [address], [place], [place] [address] Lab: [phone] Dir: Caitlin C. Rubinas, MD  ADDENDUM: BREAST BIOMARKER TESTS Estrogen Receptor (ER) Status: POSITIVE            Percentage of cells with nuclear positivity: Greater than 90%            Average intensity of staining: Strong  Progesterone Receptor (PgR) Status: POSITIVE            Percentage of cells with nuclear positivity: Greater than 90%            Average intensity of staining: Moderate  HER2 (by immunohistochemistry): NEGATIVE (Score 1+)  Cold Ischemia and Fixation Times: Meet requirements specified in latest version of the ASCO/CAP guidelines Testing Performed on Block Number(s): A2  METHODS Fixative: Formalin Estrogen Receptor: FDA cleared (Ventana) Primary Antibody: SP1 Progesterone Receptor: FDA  cleared ([facility]entana) Primary Antibody: 1E2 HER2 (by IHC): FDA cleared ([place]ana) Primary Antibody: 4B5 (PATHWAY) Immunohistochemistry controls worked appropriately. Slides were prepared by [facility], [place], [place], and interpreted by Dr. Andree.  This test was developed and its performance characteristics determined by [facility]. It has not been cleared or approved by the US Food and Drug Administration. The FDA does not require this test to go through premarket FDA review. This test is used for clinical purposes. It should not be regarded as investigational or for research. This laboratory is certified under the Clinical Laboratory Improvement Amendments (CLIA) as qualified to perform high complexity clinical laboratory testing.        Addendum #1 performed by Caitlin Andree, MD.  Electronically signed 12/20/2018 4:12:07PM The electronic signature indicates that the named Attending Pathologist has evaluated the specimen  Technical component performed at [facility], [address], [place], [place] [address] Lab: [phone] Dir: Caitlin Nagendra, MD, MMM Professional component performed at [facility], [facility], [address], [place], [place] [address] Lab: [phone] Dir: Caitlin C. Rubinas, MD  ADDENDUM: Comment Ribbon shaped biopsy clip was deployed post biopsy.        Addendum #2 performed by Caitlin Andree, MD.  Electronically signed 12/20/2018 4:35:32PM The electronic signature indicates that the named Attending Pathologist has evaluated the specimen  Technical component performed at [facility], [address], [place], [place] [address] Lab: [phone] Dir: Caitlin Nagendra, MD, MMM Professional component performed at [facility], [facility], [address], , [place] [address] Lab: [phone] Dir: Caitlin C. Rubinas, MD     RADS: CLINICAL   DATA: Left breast distortion seen on most recent  screening mammography.  EXAM: DIGITAL DIAGNOSTIC UNILATERAL LEFT MAMMOGRAM WITH CAD AND TOMO  COMPARISON: Previous exam(s).  ACR Breast Density Category b: There are scattered areas of fibroglandular density.  FINDINGS: Additional mammographic views of the left breast demonstrate persistent distortion in the left breast upper inner quadrant, middle depth.  Mammographic images were processed with CAD.  IMPRESSION: Left breast upper inner quadrant distortion, for which stereotactic core needle biopsy is recommended.  RECOMMENDATION: Stereotactic core needle biopsy of the left breast.  I have discussed the findings and recommendations with the patient. Results were also provided in writing at the conclusion of the visit. If applicable, a reminder letter will be sent to the patient regarding the next appointment.  BI-RADS CATEGORY 4: Suspicious.   Electronically Signed  By: Fidela Salisbury M.D.  On: 12/02/2018 13:15  Assessment:   Malignant neoplasm of upper-inner quadrant of left breast in female, estrogen receptor positive (CMS-HCC) [C50.212, Z17.0]  Plan:   1. Malignant neoplasm of upper-inner quadrant of left breast in female, estrogen receptor positive (CMS-HCC) [U72.536, Z17.0]  Discussed the risk of surgery including recurrence, chronic pain, post-op infxn, poor/delayed wound healing, poor cosmesis, seroma, hematoma formation, and possible re-operation to address said risks. The risks of general anesthetic, if used, includes MI, CVA, sudden death or even reaction to anesthetic medications also discussed.  Typical post-op recovery time and possbility of activity restrictions were also discussed.  Alternatives include continued observation.  Benefits include possible symptom relief, pathologic evaluation, and/or curative excision.   The patient verbalized understanding and all questions were answered to the patient's satisfaction.  2. Patient has  elected to proceed with surgical treatment. Procedure will be scheduled.  Written consent was obtained. LEFT partial mastectomy with needle loc and SNLB     Electronically signed by Benjamine Sprague, DO on 12/22/2018 10:50 AM

## 2018-12-23 NOTE — H&P (Signed)
Subjective:   CC: Malignant neoplasm of upper-inner quadrant of left breast in female, estrogen receptor positive (CMS-HCC) [C50.212, Z17.0] HPI:  Caitlin Ford is a 68 y.o. female who was referred by Mancel Bale, Ford for evaluation of above. Change was noted on last screening mammogram. Patient does not routinely do self breast exams.  Patient has not noted a change on breast exam. Age of menarche was 29.  Patient denies hormonal therapy. Patient is G2P2.  Patient denies nipple discharge. Patient denies previous breast biopsy. Patient denies a personal history of breast cancer.   Past Medical History:  has a past medical history of History of goiter, Hyperlipidemia associated with type 2 diabetes mellitus (CMS-HCC), Hyperplastic colon polyp (09/13/2004), Hypertension associated with type 2 diabetes mellitus (CMS-HCC), Hypothyroidism, postsurgical, Proteinuria, Seasonal allergic rhinitis, and Type 2 diabetes mellitus with renal manifestations, controlled (CMS-HCC).  Past Surgical History:  has a past surgical history that includes thyroidectomy total (Bilateral, 1971); Abdominal hysterectomy (05/1999); Colonoscopy (09/13/2004); Colonoscopy (02/09/2015); and percutaneous biopsy breast (Left, 12/14/2018).  Family History: family history includes Diabetes type II in her mother; Stroke in her father; Thyroid disease in her daughter and son.  Social History:  reports that she has never smoked. She has never used smokeless tobacco. She reports that she does not drink alcohol or use drugs.  Current Medications: has a current medication list which includes the following prescription(s): acetaminophen, aspirin, cetirizine, fluticasone propionate, glimepiride, glimepiride, levothyroxine, losartan, metformin, multivitamin, simvastatin, and famotidine.  Allergies:       Allergies as of 12/22/2018 - Reviewed 12/22/2018  Allergen Reaction Noted  . Altace [ramipril] Angioedema   . Avandia  [rosiglitazone] Other (See Comments) 11/13/2013  . Crestor [rosuvastatin] Muscle Pain 11/13/2013  . Lipitor [atorvastatin] Muscle Pain 11/13/2013  . Doxycycline Unknown     ROS:  A 15 point review of systems was performed and was negative except as noted in HPI   Objective:   BP (!) 152/95   Pulse 80   Ht 170.2 cm (5' 7.01")   Wt 88.3 kg (194 lb 9.6 oz)   BMI 30.47 kg/m   Constitutional :  alert, appears stated age, cooperative and no distress  Lymphatics/Throat:  no asymmetry, masses, or scars  Respiratory:  clear to auscultation bilaterally  Cardiovascular:  regular rate and rhythm  Gastrointestinal: soft, non-tender; bowel sounds normal; no masses,  no organomegaly.   Musculoskeletal: Steady gait and movement  Skin: Cool and moist  Psychiatric: Normal affect, non-agitated, not confused  Breast:  Chaperone present for exam.  breasts appear normal, no suspicious masses, no skin or nipple changes or axillary nodes.    LABS:  SURGICAL PATHOLOGY Surgical Pathology CASE: [id number] PATIENT: Caitlin Ford Surgical Pathology Report Reason for Addendum #1: Breast Biomarker Results Reason for Addendum #2: Clerical  SPECIMEN SUBMITTED: A. Breast, left  CLINICAL HISTORY: Distortion  PRE-OPERATIVE DIAGNOSIS: IMC, CSL  POST-OPERATIVE DIAGNOSIS: None provided.     DIAGNOSIS: A. Breast, left, slightly upper inner quadrant; stereotactic core biopsies: - INVASIVE MAMMARY CARCINOMA, no special type.  Size of invasive carcinoma: 7 mm in this sample Histologic grade of invasive carcinoma: Grade 1            Glandular/tubular differentiation score: 1            Nuclear pleomorphism score: 2            Mitotic rate score: 1            Total score: 4 Ductal carcinoma in  situ: Not identified Lymphovascular invasion: Not identified  ER/PR/HER2: Immunohistochemistry will be performed on block A2, with reflex to Caitlin Ford for HER2  2+. The results will be reported in an addendum. Findings are reported to Caitlin Ford in Dr. Janace Ford office on 12/15/2018.   GROSS DESCRIPTION: A. Labeled: Left breast upper inner quadrant Received: in a formalin-filled Brevera collection device Accompanying specimen radiograph: No Time/Date in fixative: Collected at 1:18 PM and placed into formalin at 1:20 PM on 12/14/2018. Cold ischemic time: Less than 5 minutes Total fixation time: 8 hours Core pieces: Multiple Measurement: Aggregate, 4.5 x 1.5 x 0.3 cm Description / comments: Received are needle core biopsy fragments of tan-yellow, fibrofatty tissue Inked: Green Entirely submitted in cassette(s): 1-5   Final Diagnosis performed by Caitlin Ford.  Electronically signed 12/15/2018 3:36:10PM The electronic signature indicates that the named Attending Pathologist has evaluated the specimen  Technical component performed at [facility], [address], [place], [place] [address] Lab: [phone] Dir: Rush Farmer, Ford, MMM Professional component performed at [facility], [facility], [place], [place], [place] [address] Lab: [phone] Dir: Dellia Nims. Rubinas, Ford  ADDENDUM: BREAST BIOMARKER TESTS Estrogen Receptor (ER) Status: POSITIVE            Percentage of cells with nuclear positivity: Greater than 90%            Average intensity of staining: Strong  Progesterone Receptor (PgR) Status: POSITIVE            Percentage of cells with nuclear positivity: Greater than 90%            Average intensity of staining: Moderate  HER2 (by immunohistochemistry): NEGATIVE (Score 1+)  Cold Ischemia and Fixation Times: Meet requirements specified in latest version of the ASCO/CAP guidelines Testing Performed on Block Number(s): A2  METHODS Fixative: Formalin Estrogen Receptor: FDA cleared (Ventana) Primary Antibody: SP1 Progesterone Receptor: FDA  cleared ([place]entana) Primary Antibody: 1E2 HER2 (by IHC): FDA cleared (Ventana) Primary Antibody: 4B5 (PATHWAY) Immunohistochemistry controls worked appropriately. Slides were prepared by [facility], [place], [place], and interpreted by Dr. Rodney Cruise.  This test was developed and its performance characteristics determined by [facility]. It has not been cleared or approved by the [place] Food and Drug Administration. The FDA does not require this test to go through premarket FDA review. This test is used for clinical purposes. It should not be regarded as investigational or for research. This laboratory is certified under the Clinical Laboratory Improvement Amendments (CLIA) as qualified to perform high complexity clinical laboratory testing.        Addendum #1 performed by Caitlin Ford.  Electronically signed 12/20/2018 4:12:07PM The electronic signature indicates that the named Attending Pathologist has evaluated the specimen  Technical component performed at [facility], [address], [place], [place] [address] Lab: [phone] Dir: Rush Farmer, Ford, MMM Professional component performed at [facility], [facility], [place], [place], [place] [address] Lab: [phone] Dir: Dellia Nims. Rubinas, Ford  ADDENDUM: Comment Ribbon shaped biopsy clip was deployed post biopsy.        Addendum #2 performed by Caitlin Ford.  Electronically signed 12/20/2018 4:35:32PM The electronic signature indicates that the named Attending Pathologist has evaluated the specimen  Technical component performed at [facility], [address] [place], [place] [address] Lab: [phone] Dir: Rush Farmer, Ford, MMM Professional component performed at [facility], [facility], [place], [place],  [address] Lab: [phone] Dir: Dellia Nims. Reuel Derby, Ford     RADS: CLINICAL  DATA: Left breast distortion seen on most recent  screening mammography.  EXAM: DIGITAL DIAGNOSTIC UNILATERAL LEFT MAMMOGRAM WITH CAD AND TOMO  COMPARISON: Previous exam(s).  ACR Breast Density Category b: There are scattered areas of fibroglandular density.  FINDINGS: Additional mammographic views of the left breast demonstrate persistent distortion in the left breast upper inner quadrant, middle depth.  Mammographic images were processed with CAD.  IMPRESSION: Left breast upper inner quadrant distortion, for which stereotactic core needle biopsy is recommended.  RECOMMENDATION: Stereotactic core needle biopsy of the left breast.  I have discussed the findings and recommendations with the patient. Results were also provided in writing at the conclusion of the visit. If applicable, a reminder letter will be sent to the patient regarding the next appointment.  BI-RADS CATEGORY 4: Suspicious.   Electronically Signed  By: Fidela Salisbury M.D.  On: 12/02/2018 13:15  Assessment:   Malignant neoplasm of upper-inner quadrant of left breast in female, estrogen receptor positive (CMS-HCC) [C50.212, Z17.0]  Plan:   1. Malignant neoplasm of upper-inner quadrant of left breast in female, estrogen receptor positive (CMS-HCC) [U72.536, Z17.0]  Discussed the risk of surgery including recurrence, chronic pain, post-op infxn, poor/delayed wound healing, poor cosmesis, seroma, hematoma formation, and possible re-operation to address said risks. The risks of general anesthetic, if used, includes MI, CVA, sudden death or even reaction to anesthetic medications also discussed.  Typical post-op recovery time and possbility of activity restrictions were also discussed.  Alternatives include continued observation.  Benefits include possible symptom relief, pathologic evaluation, and/or curative excision.   The patient verbalized understanding and all questions were answered to the patient's satisfaction.  2. Patient has  elected to proceed with surgical treatment. Procedure will be scheduled.  Written consent was obtained. LEFT partial mastectomy with needle loc and SNLB     Electronically signed by Benjamine Sprague, DO on 12/22/2018 10:50 AM

## 2018-12-24 ENCOUNTER — Encounter: Payer: Self-pay | Admitting: Hematology and Oncology

## 2018-12-24 ENCOUNTER — Inpatient Hospital Stay: Payer: Medicare HMO

## 2018-12-24 ENCOUNTER — Inpatient Hospital Stay: Payer: Medicare HMO | Attending: Hematology and Oncology | Admitting: Hematology and Oncology

## 2018-12-24 VITALS — BP 143/82 | HR 81 | Temp 98.4°F | Resp 16 | Ht 67.0 in | Wt 193.6 lb

## 2018-12-24 DIAGNOSIS — I10 Essential (primary) hypertension: Secondary | ICD-10-CM | POA: Diagnosis not present

## 2018-12-24 DIAGNOSIS — Z79811 Long term (current) use of aromatase inhibitors: Secondary | ICD-10-CM | POA: Insufficient documentation

## 2018-12-24 DIAGNOSIS — E119 Type 2 diabetes mellitus without complications: Secondary | ICD-10-CM | POA: Insufficient documentation

## 2018-12-24 DIAGNOSIS — M25561 Pain in right knee: Secondary | ICD-10-CM | POA: Diagnosis not present

## 2018-12-24 DIAGNOSIS — M25562 Pain in left knee: Secondary | ICD-10-CM | POA: Diagnosis not present

## 2018-12-24 DIAGNOSIS — Z7984 Long term (current) use of oral hypoglycemic drugs: Secondary | ICD-10-CM | POA: Diagnosis not present

## 2018-12-24 DIAGNOSIS — Z79899 Other long term (current) drug therapy: Secondary | ICD-10-CM | POA: Insufficient documentation

## 2018-12-24 DIAGNOSIS — E785 Hyperlipidemia, unspecified: Secondary | ICD-10-CM | POA: Diagnosis not present

## 2018-12-24 DIAGNOSIS — Z7982 Long term (current) use of aspirin: Secondary | ICD-10-CM | POA: Diagnosis not present

## 2018-12-24 DIAGNOSIS — E039 Hypothyroidism, unspecified: Secondary | ICD-10-CM

## 2018-12-24 DIAGNOSIS — Z90710 Acquired absence of both cervix and uterus: Secondary | ICD-10-CM

## 2018-12-24 DIAGNOSIS — Z170 Estrogen receptor positive status [ER+]: Secondary | ICD-10-CM | POA: Diagnosis not present

## 2018-12-24 DIAGNOSIS — C50212 Malignant neoplasm of upper-inner quadrant of left female breast: Secondary | ICD-10-CM | POA: Diagnosis not present

## 2018-12-24 LAB — HEPATIC FUNCTION PANEL
ALT: 25 U/L (ref 0–44)
AST: 24 U/L (ref 15–41)
Albumin: 3.6 g/dL (ref 3.5–5.0)
Alkaline Phosphatase: 78 U/L (ref 38–126)
Bilirubin, Direct: <0.1 mg/dL (ref 0.0–0.2)
Total Bilirubin: 0.5 mg/dL (ref 0.3–1.2)
Total Protein: 7.6 g/dL (ref 6.5–8.1)

## 2018-12-24 NOTE — Progress Notes (Signed)
Pt here as new patient referral from Dr. [name] for left breast cancer. States she had appt. With surgeon a few days ago and is scheduled for surgery on 01/14/2019.

## 2018-12-24 NOTE — Patient Instructions (Signed)
Letrozole tablets What is this medicine? LETROZOLE (LET roe zole) blocks the production of estrogen. It is used to treat breast cancer. This medicine may be used for other purposes; ask your health care provider or pharmacist if you have questions. COMMON BRAND NAME(S): Femara What should I tell my health care provider before I take this medicine? They need to know if you have any of these conditions:  high cholesterol  liver disease  osteoporosis (weak bones)  an unusual or allergic reaction to letrozole, other medicines, foods, dyes, or preservatives  pregnant or trying to get pregnant  breast-feeding How should I use this medicine? Take this medicine by mouth with a glass of water. You may take it with or without food. Follow the directions on the prescription label. Take your medicine at regular intervals. Do not take your medicine more often than directed. Do not stop taking except on your doctor's advice. Talk to your pediatrician regarding the use of this medicine in children. Special care may be needed. Overdosage: If you think you have taken too much of this medicine contact a poison control center or emergency room at once. NOTE: This medicine is only for you. Do not share this medicine with others. What if I miss a dose? If you miss a dose, take it as soon as you can. If it is almost time for your next dose, take only that dose. Do not take double or extra doses. What may interact with this medicine? Do not take this medicine with any of the following medications:  estrogens, like hormone replacement therapy or birth control pills This medicine may also interact with the following medications:  dietary supplements such as androstenedione or DHEA  prasterone  tamoxifen This list may not describe all possible interactions. Give your health care provider a list of all the medicines, herbs, non-prescription drugs, or dietary supplements you use. Also tell them if you  smoke, drink alcohol, or use illegal drugs. Some items may interact with your medicine. What should I watch for while using this medicine? Tell your doctor or healthcare professional if your symptoms do not start to get better or if they get worse. Do not become pregnant while taking this medicine or for 3 weeks after stopping it. Women should inform their doctor if they wish to become pregnant or think they might be pregnant. There is a potential for serious side effects to an unborn child. Talk to your health care professional or pharmacist for more information. Do not breast-feed while taking this medicine or for 3 weeks after stopping it. This medicine may interfere with the ability to have a child. Talk with your doctor or health care professional if you are concerned about your fertility. Using this medicine for a long time may increase your risk of low bone mass. Talk to your doctor about bone health. You may get drowsy or dizzy. Do not drive, use machinery, or do anything that needs mental alertness until you know how this medicine affects you. Do not stand or sit up quickly, especially if you are an older patient. This reduces the risk of dizzy or fainting spells. You may need blood work done while you are taking this medicine. What side effects may I notice from receiving this medicine? Side effects that you should report to your doctor or health care professional as soon as possible:  allergic reactions like skin rash, itching, or hives  bone fracture  chest pain  signs and symptoms of a blood  clot such as breathing problems; changes in vision; chest pain; severe, sudden headache; pain, swelling, warmth in the leg; trouble speaking; sudden numbness or weakness of the face, arm or leg  vaginal bleeding Side effects that usually do not require medical attention (report to your doctor or health care professional if they continue or are bothersome):  bone, back, joint, or muscle  pain  dizziness  fatigue  fluid retention  headache  hot flashes, night sweats  nausea  weight gain This list may not describe all possible side effects. Call your doctor for medical advice about side effects. You may report side effects to FDA at 1-800-FDA-1088. Where should I keep my medicine? Keep out of the reach of children. Store between 15 and 30 degrees C (59 and 86 degrees F). Throw away any unused medicine after the expiration date. NOTE: This sheet is a summary. It may not cover all possible information. If you have questions about this medicine, talk to your doctor, pharmacist, or health care provider.  2020 Elsevier/Gold Standard (2015-12-17 11:10:41) Tamoxifen oral tablet What is this medicine? TAMOXIFEN (ta MOX i fen) blocks the effects of estrogen. It is commonly used to treat breast cancer. It is also used to decrease the chance of breast cancer coming back in women who have received treatment for the disease. It may also help prevent breast cancer in women who have a high risk of developing breast cancer. This medicine may be used for other purposes; ask your health care provider or pharmacist if you have questions. COMMON BRAND NAME(S): Nolvadex What should I tell my health care provider before I take this medicine? They need to know if you have any of these conditions:  blood clots  blood disease  cataracts or impaired eyesight  endometriosis  high calcium levels  high cholesterol  irregular menstrual cycles  liver disease  stroke  uterine fibroids  an unusual reaction to tamoxifen, other medicines, foods, dyes, or preservatives  pregnant or trying to get pregnant  breast-feeding How should I use this medicine? Take this medicine by mouth with a glass of water. Follow the directions on the prescription label. You can take it with or without food. Take your medicine at regular intervals. Do not take your medicine more often than directed. Do  not stop taking except on your doctor's advice. A special MedGuide will be given to you by the pharmacist with each prescription and refill. Be sure to read this information carefully each time. Talk to your pediatrician regarding the use of this medicine in children. While this drug may be prescribed for selected conditions, precautions do apply. Overdosage: If you think you have taken too much of this medicine contact a poison control center or emergency room at once. NOTE: This medicine is only for you. Do not share this medicine with others. What if I miss a dose? If you miss a dose, take it as soon as you can. If it is almost time for your next dose, take only that dose. Do not take double or extra doses. What may interact with this medicine? Do not take this medicine with any of the following medications:  cisapride  certain medicines for irregular heart beat like dronedarone, quinidine  certain medicines for fungal infection like fluconazole, posaconazole  pimozide  saquinavir  thioridazine This medicine may also interact with the following medications:  aminoglutethimide  anastrozole  bromocriptine  chemotherapy drugs  dofetilide  female hormones, like estrogens and birth control pills  letrozole  medroxyprogesterone  phenobarbital  rifampin  warfarin This list may not describe all possible interactions. Give your health care provider a list of all the medicines, herbs, non-prescription drugs, or dietary supplements you use. Also tell them if you smoke, drink alcohol, or use illegal drugs. Some items may interact with your medicine. What should I watch for while using this medicine? Visit your doctor or health care professional for regular checks on your progress. You will need regular pelvic exams, breast exams, and mammograms. If you are taking this medicine to reduce your risk of getting breast cancer, you should know that this medicine does not prevent all  types of breast cancer. If breast cancer or other problems occur, there is no guarantee that it will be found at an early stage. Do not become pregnant while taking this medicine or for 2 months after stopping it. Women should inform their doctor if they wish to become pregnant or think they might be pregnant. There is a potential for serious side effects to an unborn child. Talk to your health care professional or pharmacist for more information. Do not breast-feed an infant while taking this medicine or for 3 months after stopping it. This medicine may interfere with the ability to have a child. Talk with your doctor or health care professional if you are concerned about your fertility. What side effects may I notice from receiving this medicine? Side effects that you should report to your doctor or health care professional as soon as possible:  allergic reactions like skin rash, itching or hives, swelling of the face, lips, or tongue  changes in vision  changes in your menstrual cycle  difficulty walking or talking  new breast lumps  numbness  pelvic pain or pressure  redness, blistering, peeling or loosening of the skin, including inside the mouth  signs and symptoms of a dangerous change in heartbeat or heart rhythm like chest pain, dizziness, fast or irregular heartbeat, palpitations, feeling faint or lightheaded, falls, breathing problems  sudden chest pain  swelling, pain or tenderness in your calf or leg  unusual bruising or bleeding  vaginal discharge that is bloody, brown, or rust  weakness  yellowing of the whites of the eyes or skin Side effects that usually do not require medical attention (report to your doctor or health care professional if they continue or are bothersome):  fatigue  hair loss, although uncommon and is usually mild  headache  hot flashes  impotence (in men)  nausea, vomiting (mild)  vaginal discharge (white or clear) This list may not  describe all possible side effects. Call your doctor for medical advice about side effects. You may report side effects to FDA at 1-800-FDA-1088. Where should I keep my medicine? Keep out of the reach of children. Store at room temperature between 20 and 25 degrees C (68 and 77 degrees F). Protect from light. Keep container tightly closed. Throw away any unused medicine after the expiration date. NOTE: This sheet is a summary. It may not cover all possible information. If you have questions about this medicine, talk to your doctor, pharmacist, or health care provider.  2020 Elsevier/Gold Standard (2018-05-04 11:15:31)

## 2018-12-25 LAB — CANCER ANTIGEN 27.29: CA 27.29: 15 U/mL (ref 0.0–38.6)

## 2018-12-26 ENCOUNTER — Other Ambulatory Visit: Payer: Self-pay

## 2019-01-05 ENCOUNTER — Encounter
Admission: RE | Admit: 2019-01-05 | Discharge: 2019-01-05 | Disposition: A | Discharge status: Home / Self Care | Payer: Medicare HMO | Source: Ambulatory Visit | Attending: Surgery | Admitting: Surgery

## 2019-01-05 ENCOUNTER — Other Ambulatory Visit: Payer: Self-pay

## 2019-01-05 DIAGNOSIS — E119 Type 2 diabetes mellitus without complications: Secondary | ICD-10-CM | POA: Insufficient documentation

## 2019-01-05 DIAGNOSIS — I10 Essential (primary) hypertension: Secondary | ICD-10-CM | POA: Diagnosis not present

## 2019-01-05 DIAGNOSIS — R9431 Abnormal electrocardiogram [ECG] [EKG]: Secondary | ICD-10-CM | POA: Diagnosis not present

## 2019-01-05 DIAGNOSIS — Z01818 Encounter for other preprocedural examination: Secondary | ICD-10-CM | POA: Insufficient documentation

## 2019-01-05 HISTORY — DX: Unspecified osteoarthritis, unspecified site: M19.90

## 2019-01-05 HISTORY — DX: Gastro-esophageal reflux disease without esophagitis: K21.9

## 2019-01-05 NOTE — Patient Instructions (Signed)
Your procedure is scheduled on: 01-14-19 FRIDAY Report to Same Day Surgery 2nd floor medical mall [facility] Entrance-take elevator on left to 2nd floor.  Check in with surgery information desk.) To find out your arrival time please call [phone] between 1PM - 3PM on 01-13-19 THURSDAY  Remember: Instructions that are not followed completely may result in serious medical risk, up to and including death, or upon the discretion of your surgeon and anesthesiologist your surgery may need to be rescheduled.    _x___ 1. Do not eat food after midnight the night before your procedure. NO GUM OR CANDY AFTER MIDNIGHT. You may drink WATER up to 2 hours before you are scheduled to arrive at the hospital for your procedure.  Do not drink WATER  within 2 hours of your scheduled arrival to the hospital.  Type 1 and type 2 diabetics should only drink water.   ____Ensure clear carbohydrate drink on the way to the hospital for bariatric patients  ____Ensure clear carbohydrate drink 3 hours before surgery.     __x__ 2. No Alcohol for 24 hours before or after surgery.   __x__3. No Smoking or e-cigarettes for 24 prior to surgery.  Do not use any chewable tobacco products for at least 6 hour prior to surgery   ____  4. Bring all medications with you on the day of surgery if instructed.    __x__ 5. Notify your doctor if there is any change in your medical condition     (cold, fever, infections).    x___6. On the morning of surgery brush your teeth with toothpaste and water.  You may rinse your mouth with mouth wash if you wish.  Do not swallow any toothpaste or mouthwash.   Do not wear jewelry, make-up, hairpins, clips or nail polish.  Do not wear lotions, powders, or perfumes. You may wear deodorant.  Do not shave 48 hours prior to surgery. Men may shave face and neck.  Do not bring valuables to the hospital.    [facility] is not responsible for any belongings or valuables.               Contacts,  dentures or bridgework may not be worn into surgery.  Leave your suitcase in the car. After surgery it may be brought to your room.  For patients admitted to the hospital, discharge time is determined by your treatment team.  _  Patients discharged the day of surgery will not be allowed to drive home.  You will need someone to drive you home and stay with you the night of your procedure.    Please read over the following fact sheets that you were given:   [facility] Preparing for Surgery  _x___ TAKE THE FOLLOWING MEDICATION THE MORNING OF SURGERY WITH A SMALL SIP OF WATER. These include:  1. LEVOTHYROXINE   2.  3.  4.  5.  6.  ____Fleets enema or Magnesium Citrate as directed.   _x___ Use CHG Soap or sage wipes as directed on instruction sheet   ____ Use inhalers on the day of surgery and bring to hospital day of surgery  _X___ Stop Metformin 2 days prior to surgery-LAST DOSE ON Tuesday, AUGUST 18TH   ____ Take 1/2 of usual insulin dose the night before surgery and none on the morning surgery.   _x___ Follow recommendations from Cardiologist, Pulmonologist or PCP regarding stopping Aspirin, Coumadin, Plavix ,Eliquis, Effient, or Pradaxa, and Pletal-STOP ASPIRIN 5 DAYS PRIOR TO SURGERY PER  SURGEONS OFFICE  X____Stop Anti-inflammatories such as Advil, Aleve, Ibuprofen, Motrin, Naproxen, Naprosyn, Goodies powders or aspirin products NOW-OK to take Tylenol    ____ Stop supplements until after surgery.     ____ Bring C-Pap to the hospital.

## 2019-01-06 NOTE — Pre-Procedure Instructions (Signed)
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 Dr Andree Elk wanting medical clearance

## 2019-01-06 NOTE — Pre-Procedure Instructions (Signed)
REQUEST FOR MEDICAL CLEARANCE/EKG CALLED AND FAXED TO DR [name] AND INFO CALLED AND FAXED TO DR Lysle Pearl

## 2019-01-07 ENCOUNTER — Other Ambulatory Visit: Payer: Medicare HMO

## 2019-01-10 NOTE — Pre-Procedure Instructions (Signed)
CALLED DR THIES TO HAVE OFFICE ARRANGE CARDIAC REFERRRAL FOR CLEARANCE. LM FOR NURSE. SPOKE WITH RECEPTIONIST

## 2019-01-11 ENCOUNTER — Other Ambulatory Visit
Admission: RE | Admit: 2019-01-11 | Discharge: 2019-01-11 | Disposition: A | Discharge status: Home / Self Care | Payer: Medicare HMO | Source: Ambulatory Visit | Attending: Surgery | Admitting: Surgery

## 2019-01-11 ENCOUNTER — Other Ambulatory Visit: Payer: Self-pay

## 2019-01-11 DIAGNOSIS — Z01812 Encounter for preprocedural laboratory examination: Secondary | ICD-10-CM | POA: Insufficient documentation

## 2019-01-11 DIAGNOSIS — Z20828 Contact with and (suspected) exposure to other viral communicable diseases: Secondary | ICD-10-CM | POA: Diagnosis not present

## 2019-01-11 LAB — SARS CORONAVIRUS 2 (TAT 6-24 HRS): SARS Coronavirus 2: NEGATIVE

## 2019-01-12 NOTE — Pre-Procedure Instructions (Signed)
Cardiac clearance on chart. 

## 2019-01-13 MED ORDER — CEFAZOLIN SODIUM-DEXTROSE 2-4 GM/100ML-% IV SOLN
2.0000 g | INTRAVENOUS | Status: AC
  Administered 2019-01-14: 11:00:00 2 g via INTRAVENOUS

## 2019-01-14 ENCOUNTER — Ambulatory Visit
Admission: RE | Admit: 2019-01-14 | Discharge: 2019-01-14 | Disposition: A | Discharge status: Home / Self Care | Payer: Medicare HMO | Attending: Surgery | Admitting: Surgery

## 2019-01-14 ENCOUNTER — Ambulatory Visit: Payer: Medicare HMO | Admitting: Anesthesiology

## 2019-01-14 ENCOUNTER — Encounter
Admission: RE | Admit: 2019-01-14 | Discharge: 2019-01-14 | Disposition: A | Discharge status: Home / Self Care | Payer: Medicare HMO | Source: Ambulatory Visit | Attending: Surgery | Admitting: Surgery

## 2019-01-14 ENCOUNTER — Ambulatory Visit
Admission: RE | Admit: 2019-01-14 | Discharge: 2019-01-14 | Disposition: A | Discharge status: Home / Self Care | Payer: Medicare HMO | Source: Ambulatory Visit | Attending: Surgery | Admitting: Surgery

## 2019-01-14 ENCOUNTER — Encounter
Admission: RE | Disposition: A | Discharge status: Home / Self Care | Payer: Self-pay | Source: Home / Self Care | Attending: Surgery

## 2019-01-14 ENCOUNTER — Other Ambulatory Visit: Payer: Self-pay

## 2019-01-14 ENCOUNTER — Encounter: Payer: Self-pay | Admitting: *Deleted

## 2019-01-14 DIAGNOSIS — Z79890 Hormone replacement therapy (postmenopausal): Secondary | ICD-10-CM | POA: Diagnosis not present

## 2019-01-14 DIAGNOSIS — I10 Essential (primary) hypertension: Secondary | ICD-10-CM | POA: Diagnosis not present

## 2019-01-14 DIAGNOSIS — E785 Hyperlipidemia, unspecified: Secondary | ICD-10-CM | POA: Insufficient documentation

## 2019-01-14 DIAGNOSIS — Z79899 Other long term (current) drug therapy: Secondary | ICD-10-CM | POA: Insufficient documentation

## 2019-01-14 DIAGNOSIS — C50212 Malignant neoplasm of upper-inner quadrant of left female breast: Secondary | ICD-10-CM | POA: Insufficient documentation

## 2019-01-14 DIAGNOSIS — E119 Type 2 diabetes mellitus without complications: Secondary | ICD-10-CM | POA: Diagnosis not present

## 2019-01-14 DIAGNOSIS — E890 Postprocedural hypothyroidism: Secondary | ICD-10-CM | POA: Insufficient documentation

## 2019-01-14 DIAGNOSIS — Z7982 Long term (current) use of aspirin: Secondary | ICD-10-CM | POA: Insufficient documentation

## 2019-01-14 DIAGNOSIS — Z7984 Long term (current) use of oral hypoglycemic drugs: Secondary | ICD-10-CM | POA: Diagnosis not present

## 2019-01-14 DIAGNOSIS — Z170 Estrogen receptor positive status [ER+]: Secondary | ICD-10-CM | POA: Diagnosis not present

## 2019-01-14 DIAGNOSIS — K219 Gastro-esophageal reflux disease without esophagitis: Secondary | ICD-10-CM | POA: Insufficient documentation

## 2019-01-14 HISTORY — PX: PARTIAL MASTECTOMY WITH NEEDLE LOCALIZATION AND AXILLARY SENTINEL LYMPH NODE BX: SHX6009

## 2019-01-14 HISTORY — PX: BREAST LUMPECTOMY: SHX2

## 2019-01-14 LAB — GLUCOSE, CAPILLARY
Glucose-Capillary: 185 mg/dL — ABNORMAL HIGH (ref 70–99)
Glucose-Capillary: 179 mg/dL — ABNORMAL HIGH (ref 70–99)

## 2019-01-14 SURGERY — PARTIAL MASTECTOMY WITH NEEDLE LOCALIZATION AND AXILLARY SENTINEL LYMPH NODE BX
Anesthesia: General | Laterality: Left

## 2019-01-14 MED ORDER — FENTANYL CITRATE (PF) 100 MCG/2ML IJ SOLN: 25.0000 ug | INTRAMUSCULAR | Status: DC | PRN

## 2019-01-14 MED ORDER — TECHNETIUM TC 99M SULFUR COLLOID FILTERED
0.8150 | Freq: Once | INTRAVENOUS | Status: AC | PRN
  Administered 2019-01-14: 0.815 via INTRADERMAL

## 2019-01-14 MED ORDER — FAMOTIDINE 20 MG PO TABS
20.0000 mg | ORAL_TABLET | Freq: Once | ORAL | Status: AC
  Administered 2019-01-14: 10:00:00 20 mg via ORAL

## 2019-01-14 MED ORDER — PROPOFOL 10 MG/ML IV BOLUS
INTRAVENOUS | Status: DC | PRN
  Administered 2019-01-14: 150 mg via INTRAVENOUS

## 2019-01-14 MED ORDER — LIDOCAINE HCL (PF) 1 % IJ SOLN
INTRAMUSCULAR | Status: AC
  Filled 2019-01-14: qty 30

## 2019-01-14 MED ORDER — DEXAMETHASONE SODIUM PHOSPHATE 10 MG/ML IJ SOLN
INTRAMUSCULAR | Status: DC | PRN
  Administered 2019-01-14: 5 mg via INTRAVENOUS

## 2019-01-14 MED ORDER — PHENYLEPHRINE HCL (PRESSORS) 10 MG/ML IV SOLN
INTRAVENOUS | Status: DC | PRN
  Administered 2019-01-14: 100 ug via INTRAVENOUS

## 2019-01-14 MED ORDER — GABAPENTIN 300 MG PO CAPS
ORAL_CAPSULE | ORAL | Status: AC
  Filled 2019-01-14: qty 1

## 2019-01-14 MED ORDER — LIDOCAINE HCL (PF) 2 % IJ SOLN
INTRAMUSCULAR | Status: AC
  Filled 2019-01-14: qty 10

## 2019-01-14 MED ORDER — LIDOCAINE HCL 1 % IJ SOLN
INTRAMUSCULAR | Status: DC | PRN
  Administered 2019-01-14: 28 mL via SUBCUTANEOUS

## 2019-01-14 MED ORDER — DEXAMETHASONE SODIUM PHOSPHATE 10 MG/ML IJ SOLN
INTRAMUSCULAR | Status: AC
  Filled 2019-01-14: qty 1

## 2019-01-14 MED ORDER — ONDANSETRON HCL 4 MG/2ML IJ SOLN: 4.0000 mg | Freq: Once | INTRAMUSCULAR | Status: DC | PRN

## 2019-01-14 MED ORDER — EPHEDRINE SULFATE 50 MG/ML IJ SOLN
INTRAMUSCULAR | Status: DC | PRN
  Administered 2019-01-14: 10 mg via INTRAVENOUS

## 2019-01-14 MED ORDER — FENTANYL CITRATE (PF) 100 MCG/2ML IJ SOLN
INTRAMUSCULAR | Status: DC | PRN
  Administered 2019-01-14: 50 ug via INTRAVENOUS

## 2019-01-14 MED ORDER — LIDOCAINE HCL (CARDIAC) PF 100 MG/5ML IV SOSY
PREFILLED_SYRINGE | INTRAVENOUS | Status: DC | PRN
  Administered 2019-01-14: 80 mg via INTRAVENOUS

## 2019-01-14 MED ORDER — SUCCINYLCHOLINE CHLORIDE 20 MG/ML IJ SOLN
INTRAMUSCULAR | Status: DC | PRN
  Administered 2019-01-14: 100 mg via INTRAVENOUS

## 2019-01-14 MED ORDER — BUPIVACAINE HCL (PF) 0.5 % IJ SOLN
INTRAMUSCULAR | Status: AC
  Filled 2019-01-14: qty 30

## 2019-01-14 MED ORDER — CHLORHEXIDINE GLUCONATE CLOTH 2 % EX PADS: 6.0000 | MEDICATED_PAD | Freq: Once | CUTANEOUS | Status: DC

## 2019-01-14 MED ORDER — DOCUSATE SODIUM 100 MG PO CAPS: 100.0000 mg | ORAL_CAPSULE | Freq: Two times a day (BID) | ORAL | 0 refills | Status: AC | PRN

## 2019-01-14 MED ORDER — ONDANSETRON HCL 4 MG/2ML IJ SOLN
INTRAMUSCULAR | Status: AC
  Filled 2019-01-14: qty 2

## 2019-01-14 MED ORDER — ROCURONIUM BROMIDE 100 MG/10ML IV SOLN
INTRAVENOUS | Status: DC | PRN
  Administered 2019-01-14 (×2): 10 mg via INTRAVENOUS

## 2019-01-14 MED ORDER — IBUPROFEN 800 MG PO TABS: 800.0000 mg | ORAL_TABLET | Freq: Three times a day (TID) | ORAL | 0 refills | Status: DC | PRN

## 2019-01-14 MED ORDER — FAMOTIDINE 20 MG PO TABS
ORAL_TABLET | ORAL | Status: AC
  Filled 2019-01-14: qty 1

## 2019-01-14 MED ORDER — HYDROCODONE-ACETAMINOPHEN 5-325 MG PO TABS: 1.0000 | ORAL_TABLET | Freq: Four times a day (QID) | ORAL | 0 refills | Status: AC | PRN

## 2019-01-14 MED ORDER — CEFAZOLIN SODIUM-DEXTROSE 2-4 GM/100ML-% IV SOLN
INTRAVENOUS | Status: AC
  Filled 2019-01-14: qty 100

## 2019-01-14 MED ORDER — ROCURONIUM BROMIDE 50 MG/5ML IV SOLN
INTRAVENOUS | Status: AC
  Filled 2019-01-14: qty 1

## 2019-01-14 MED ORDER — SODIUM CHLORIDE 0.9 % IV SOLN
INTRAVENOUS | Status: DC
  Administered 2019-01-14: 09:00:00 via INTRAVENOUS

## 2019-01-14 MED ORDER — SUGAMMADEX SODIUM 200 MG/2ML IV SOLN
INTRAVENOUS | Status: DC | PRN
  Administered 2019-01-14: 180 mg via INTRAVENOUS

## 2019-01-14 MED ORDER — SUCCINYLCHOLINE CHLORIDE 20 MG/ML IJ SOLN
INTRAMUSCULAR | Status: AC
  Filled 2019-01-14: qty 1

## 2019-01-14 MED ORDER — EPHEDRINE SULFATE 50 MG/ML IJ SOLN
INTRAMUSCULAR | Status: AC
  Filled 2019-01-14: qty 1

## 2019-01-14 MED ORDER — GABAPENTIN 300 MG PO CAPS
300.0000 mg | ORAL_CAPSULE | ORAL | Status: AC
  Administered 2019-01-14: 10:00:00 300 mg via ORAL

## 2019-01-14 MED ORDER — ACETAMINOPHEN 500 MG PO TABS
ORAL_TABLET | ORAL | Status: AC
  Filled 2019-01-14: qty 2

## 2019-01-14 MED ORDER — ACETAMINOPHEN 500 MG PO TABS
1000.0000 mg | ORAL_TABLET | ORAL | Status: AC
  Administered 2019-01-14: 10:00:00 1000 mg via ORAL

## 2019-01-14 MED ORDER — FENTANYL CITRATE (PF) 100 MCG/2ML IJ SOLN
INTRAMUSCULAR | Status: AC
  Filled 2019-01-14: qty 2

## 2019-01-14 MED ORDER — ACETAMINOPHEN 325 MG PO TABS: 650.0000 mg | ORAL_TABLET | Freq: Three times a day (TID) | ORAL | 0 refills | Status: AC | PRN

## 2019-01-14 MED ORDER — ONDANSETRON HCL 4 MG/2ML IJ SOLN
INTRAMUSCULAR | Status: DC | PRN
  Administered 2019-01-14: 4 mg via INTRAVENOUS

## 2019-01-14 SURGICAL SUPPLY — 45 items
APPLIER CLIP 11 MED OPEN (CLIP)
BLADE SURG 15 STRL LF DISP TIS (BLADE) ×1 IMPLANT
BLADE SURG 15 STRL SS (BLADE) ×2
CANISTER SUCT 1200ML W/VALVE (MISCELLANEOUS) ×3 IMPLANT
CHLORAPREP W/TINT 26 (MISCELLANEOUS) ×3 IMPLANT
CLIP APPLIE 11 MED OPEN (CLIP) IMPLANT
CNTNR SPEC 2.5X3XGRAD LEK (MISCELLANEOUS) ×1
CONT SPEC 4OZ STER OR WHT (MISCELLANEOUS) ×2
CONTAINER SPEC 2.5X3XGRAD LEK (MISCELLANEOUS) ×1 IMPLANT
COVER WAND RF STERILE (DRAPES) ×3 IMPLANT
DERMABOND ADVANCED (GAUZE/BANDAGES/DRESSINGS) ×2
DERMABOND ADVANCED .7 DNX12 (GAUZE/BANDAGES/DRESSINGS) ×1 IMPLANT
DEVICE DUBIN SPECIMEN MAMMOGRA (MISCELLANEOUS) ×3 IMPLANT
DRAPE 3/4 80X56 (DRAPES) ×3 IMPLANT
DRAPE LAPAROTOMY TRNSV 106X77 (MISCELLANEOUS) ×3 IMPLANT
ELECT CAUTERY BLADE TIP 2.5 (TIP) ×3
ELECT CAUTERY NEEDLE 2.0 MIC (NEEDLE) ×3 IMPLANT
ELECT REM PT RETURN 9FT ADLT (ELECTROSURGICAL) ×3
ELECTRODE CAUTERY BLDE TIP 2.5 (TIP) ×1 IMPLANT
ELECTRODE REM PT RTRN 9FT ADLT (ELECTROSURGICAL) ×1 IMPLANT
GAUZE SPONGE 4X4 12PLY STRL (GAUZE/BANDAGES/DRESSINGS) ×3 IMPLANT
GLOVE BIOGEL PI IND STRL 7.0 (GLOVE) ×3 IMPLANT
GLOVE BIOGEL PI INDICATOR 7.0 (GLOVE) ×6
GLOVE SURG SYN 6.5 ES PF (GLOVE) ×9 IMPLANT
GOWN STRL REUS W/ TWL LRG LVL3 (GOWN DISPOSABLE) ×3 IMPLANT
GOWN STRL REUS W/TWL LRG LVL3 (GOWN DISPOSABLE) ×6
JACKSON PRATT 10 (INSTRUMENTS) IMPLANT
KIT TURNOVER KIT A (KITS) ×3 IMPLANT
LABEL OR SOLS (LABEL) ×3 IMPLANT
LIGHT WAVEGUIDE WIDE FLAT (MISCELLANEOUS) ×6 IMPLANT
NEEDLE HYPO 25X1 1.5 SAFETY (NEEDLE) ×6 IMPLANT
PACK BASIN MINOR ARMC (MISCELLANEOUS) ×3 IMPLANT
SLEVE PROBE SENORX GAMMA FIND (MISCELLANEOUS) ×6 IMPLANT
SUT MNCRL 4-0 (SUTURE) ×4
SUT MNCRL 4-0 27XMFL (SUTURE) ×2
SUT SILK 2 0 (SUTURE)
SUT SILK 2 0 SH (SUTURE) ×3 IMPLANT
SUT SILK 2-0 30XBRD TIE 12 (SUTURE) IMPLANT
SUT SILK 3 0 12 30 (SUTURE) IMPLANT
SUT VIC AB 3-0 SH 27 (SUTURE) ×4
SUT VIC AB 3-0 SH 27X BRD (SUTURE) ×2 IMPLANT
SUTURE MNCRL 4-0 27XMF (SUTURE) ×2 IMPLANT
SYR 10ML LL (SYRINGE) ×3 IMPLANT
SYR 50ML LL SCALE MARK (SYRINGE) IMPLANT
WATER STERILE IRR 1000ML POUR (IV SOLUTION) ×3 IMPLANT

## 2019-01-14 NOTE — Anesthesia Post-op Follow-up Note (Signed)
Anesthesia QCDR form completed.        

## 2019-01-14 NOTE — Anesthesia Procedure Notes (Signed)
Procedure Name: Intubation Performed by: Fredderick Phenix, CRNA Pre-anesthesia Checklist: Patient identified, Emergency Drugs available, Suction available and Patient being monitored Patient Re-evaluated:Patient Re-evaluated prior to induction Oxygen Delivery Method: Circle system utilized Preoxygenation: Pre-oxygenation with 100% oxygen Induction Type: IV induction Ventilation: Mask ventilation without difficulty Laryngoscope Size: 4 and Mac Grade View: Grade II Tube type: Oral Tube size: 7.0 mm Number of attempts: 1 Airway Equipment and Method: Stylet and Oral airway Placement Confirmation: ETT inserted through vocal cords under direct vision,  positive ETCO2 and breath sounds checked- equal and bilateral Secured at: 22 cm Tube secured with: Tape Dental Injury: Teeth and Oropharynx as per pre-operative assessment

## 2019-01-14 NOTE — Op Note (Signed)
Preoperative diagnosis:  left breast carcinoma.  Postoperative diagnosis: same.   Procedure: needle-localized left  breast partial mastectomy.                      left  Axillary Sentinel Lymph node biopsy  Anesthesia: GETA  Surgeon: Dr. Benjamine Sprague  Wound Classification: Clean  Indications: Patient is a 68 y.o. female with a nonpalpable left  breast mass noted on mammography with core biopsy demonstrating invasive mammary carcinoma requires needle-localized partial mastectomy for treatment with sentinel lymph node biopsy.   Specimen: left  Breast mass, posterior margin Sentinel Lymph nodes x 2  Complications: None  Estimated Blood Loss: 26mL  Findings: 1. Specimen mammography shows marker and wire on specimen 2. Pathology call refers gross examination of margins was positive on posterior margin, re-excised posteriormargins sent and noted to be clear 3. SLN x3 removed 3. No other palpable mass or grossly abnormal lymph node identified.   Description of procedure: Preoperative needle localization was performed by radiology. In the nuclear medicine suite, the subareolar region was injected with Tc-99 sulfur colloid. Localization studies were reviewed. The patient was taken to the operating room and placed supine on the operating table, and after general anesthesia the left breast and axilla were prepped and draped in the usual sterile fashion. A time-out was completed verifying correct patient, procedure, site, positioning, and implant(s) and/or special equipment prior to beginning this procedure.   By comparing the localization studies with the direction and skin entry site of the needle, the probable trajectory and location of the mass was visualized. A skin incision was planned in such a way as to minimize the amount of dissection to reach the mass.   The skin incision was made after infusion of local. Flaps were raised and the location of the wire confirmed. The wire was delivered  into the wound. Sharp and blunt dissection was then taken down to the mass  taking care to include the entire localizing needle and a margin of grossly normal tissue.  The specimen and entire localizing wire were removed. The specimen was oriented with long lateral, short superior, deep double sutures and sent to radiology with the localization studies.  Posterior margin was positive for malignant cells, so an additional tissue at the posterior margin was removed.  Was labeled with short superior, long lateral, and double deep sutures and sent off to pathology.  Confirmation was given that the additional margins were negative.    A hand-held gamma probe was used to identify the location of the hottest spot in the axilla. An incision was made around the caudal axillary hairline. Sharp and blunt Dissection was carried down to subdermal facias. The probe was placed within wound and again, the point of maximal count was found. Dissection continue until nodule was identified. The probe was placed in contact with the node and 450 counts were recorded. The node was excised in its entirety. Ex vivo, the node measured 560 counts when placed on the probe. The bed of the node measured 30 counts. An additional hot spot was detected and the node was excised in similar fashion. No additional hot spots were identified. No clinically abnormal nodes were palpated. Both wounds irrigated, hemostasis was achieved with combination of electrocautery and ligation of vessels during the dissection with 3-0 silk.   The wounds closed in layers with  interrupted sutures of 3-0 Vicryl in deep dermal layer and a running subcuticular suture of Monocryl 4-0, then dressed with  dermabond. The patient tolerated the procedure well and was taken to the postanesthesia care unit in stable condition. Sponge and instrument count correct at end of procedure.

## 2019-01-14 NOTE — Transfer of Care (Signed)
Immediate Anesthesia Transfer of Care Note  Patient: Caitlin Ford  Procedure(s) Performed: PARTIAL MASTECTOMY WITH NEEDLE LOCALIZATION AND AXILLARY SENTINEL LYMPH NODE BX (Left )  Patient Location: PACU  Anesthesia Type:General  Level of Consciousness: awake and alert   Airway & Oxygen Therapy: Patient Spontanous Breathing and Patient connected to nasal cannula oxygen  Post-op Assessment: Report given to RN and Post -op Vital signs reviewed and stable  Post vital signs: Reviewed and stable  Last Vitals:  Vitals Value Taken Time  BP 150/79 01/14/19 1227  Temp 36.1 C 01/14/19 1227  Pulse 57 01/14/19 1234  Resp 13 01/14/19 1234  SpO2 100 % 01/14/19 1234  Vitals shown include unvalidated device data.  Last Pain:  Vitals:   01/14/19 1227  TempSrc:   PainSc: Asleep         Complications: No apparent anesthesia complications

## 2019-01-14 NOTE — Discharge Instructions (Signed)
AMBULATORY SURGERY  DISCHARGE INSTRUCTIONS   1) The drugs that you were given will stay in your system until tomorrow so for the next 24 hours you should not:  A) Drive an automobile B) Make any legal decisions C) Drink any alcoholic beverage   2) You may resume regular meals tomorrow.  Today it is better to start with liquids and gradually work up to solid foods.  You may eat anything you prefer, but it is better to start with liquids, then soup and crackers, and gradually work up to solid foods.   3) Please notify your doctor immediately if you have any unusual bleeding, trouble breathing, redness and pain at the surgery site, drainage, fever, or pain not relieved by medication.    4) Additional Instructions:  Patient states she has post op appt already made       Please contact your physician with any problems or Same Day Surgery at [phone], Monday through Friday 6 am to 4 pm, or [place] at [facility] number at [phone].Breast cancer removal and lymph node biopsy, Care After This sheet gives you information about how to care for yourself after your procedure. Your health care provider may also give you more specific instructions. If you have problems or questions, contact your health care provider. What can I expect after the procedure? After the procedure, it is common to have:  Soreness.  Bruising.  Itching. Follow these instructions at home: site care Follow instructions from your health care provider about how to take care of your site. Make sure you:  Wash your hands with soap and water before and after you change your bandage (dressing). If soap and water are not available, use hand sanitizer.  Leave stitches (sutures), skin glue, or adhesive strips in place. These skin closures may need to stay in place for 2 weeks or longer. If adhesive strip edges start to loosen and curl up, you may trim the loose edges. Do not remove adhesive strips  completely unless your health care provider tells you to do that.  If the area bleeds or bruises, apply gentle pressure for 10 minutes.  OK TO SHOWER IN 24HRS  Check your site every day for signs of infection. Check for:  Redness, swelling, or pain.  Fluid or blood.  Warmth.  Pus or a bad smell.  General instructions  Rest and then return to your normal activities as told by your health care provider.   tylenol and advil as needed for discomfort.  Please alternate between the two every four hours as needed for pain.     Use narcotics, if prescribed, only when tylenol and motrin is not enough to control pain.   325-650mg  every 8hrs to max of 3000mg /24hrs (including the 325mg  in every norco dose) for the tylenol.     Advil up to 800mg  per dose every 8hrs as needed for pain.    Keep all follow-up visits as told by your health care provider. This is important. Contact a health care provider if:  You have redness, swelling, or pain around your site.  You have fluid or blood coming from your site.  Your site feels warm to the touch.  You have pus or a bad smell coming from your site.  You have a fever.  Your sutures, skin glue, or adhesive strips loosen or come off sooner than expected. Get help right away if:  You have bleeding that does not stop with pressure or a dressing. Summary  After  the procedure, it is common to have some soreness, bruising, and itching at the site.  Follow instructions from your health care provider about how to take care of your site.  Check your site every day for signs of infection.  Contact a health care provider if you have redness, swelling, or pain around your site, or your site feels warm to the touch.  Keep all follow-up visits as told by your health care provider. This is important. This information is not intended to replace advice given to you by your health care provider. Make sure you discuss any questions you have with your  health care provider. Document Released: 06/08/2015 Document Revised: 11/09/2017 Document Reviewed: 11/09/2017 Elsevier Interactive Patient Education  [employer].

## 2019-01-14 NOTE — Anesthesia Preprocedure Evaluation (Addendum)
Anesthesia Evaluation  Patient identified by MRN, date of birth, ID band Patient awake    Reviewed: Allergy & Precautions, H&P , NPO status , Patient's Chart, lab work & pertinent test results, reviewed documented beta blocker date and time   History of Anesthesia Complications Negative for: history of anesthetic complications  Airway Mallampati: III  TM Distance: <3 FB Neck ROM: full    Dental no notable dental hx. (+) Partial Upper, Lower Dentures   Pulmonary neg pulmonary ROS,    Pulmonary exam normal breath sounds clear to auscultation       Cardiovascular Exercise Tolerance: Good hypertension, Normal cardiovascular exam Rhythm:regular Rate:Normal     Neuro/Psych negative neurological ROS  negative psych ROS   GI/Hepatic Neg liver ROS, GERD  ,  Endo/Other  diabetes, Well Controlled, Oral Hypoglycemic AgentsHypothyroidism   Renal/GU negative Renal ROS  negative genitourinary   Musculoskeletal  (+) Arthritis , Osteoarthritis,    Abdominal   Peds negative pediatric ROS (+)  Hematology negative hematology ROS (+)   Anesthesia Other Findings Past Medical History:   Hypothyroidism                                               Diabetes mellitus without complication                       Hypertension                                                 Hypercholesteremia                                           Wears dentures                                                 Comment:partial upper, full lower   Headache                                                       Comment:simus   Goiter                                                       Proteinuria                                                  Reproductive/Obstetrics negative OB ROS  Anesthesia Physical  Anesthesia Plan  ASA: III  Anesthesia Plan: General   Post-op Pain Management:    Induction:  Intravenous  PONV Risk Score and Plan:   Airway Management Planned: Oral ETT  Additional Equipment:   Intra-op Plan:   Post-operative Plan: Extubation in OR  Informed Consent: I have reviewed the patients History and Physical, chart, labs and discussed the procedure including the risks, benefits and alternatives for the proposed anesthesia with the patient or authorized representative who has indicated his/her understanding and acceptance.     Dental Advisory Given  Plan Discussed with: Anesthesiologist, CRNA and Surgeon  Anesthesia Plan Comments:        Anesthesia Quick Evaluation

## 2019-01-14 NOTE — Anesthesia Postprocedure Evaluation (Signed)
Anesthesia Post Note  Patient: Caitlin Ford  Procedure(s) Performed: PARTIAL MASTECTOMY WITH NEEDLE LOCALIZATION AND AXILLARY SENTINEL LYMPH NODE BX (Left )  Patient location during evaluation: PACU Anesthesia Type: General Level of consciousness: awake and alert and oriented Pain management: pain level controlled Vital Signs Assessment: post-procedure vital signs reviewed and stable Respiratory status: spontaneous breathing Cardiovascular status: blood pressure returned to baseline Anesthetic complications: no     Last Vitals:  Vitals:   01/14/19 1309 01/14/19 1322  BP: 139/82 139/67  Pulse: 64 (!) 56  Resp: 16 20  Temp:  (!) 36.1 C  SpO2: 98% 97%    Last Pain:  Vitals:   01/14/19 1322  TempSrc: Temporal  PainSc: 3                  Suhaylah Wampole

## 2019-01-14 NOTE — Interval H&P Note (Signed)
History and Physical Interval Note:  01/14/2019 9:36 AM  Caitlin Ford  has presented today for surgery, with the diagnosis of Malignant neoplasm of upper, inner quadrant of left breast in female, estrogen receptor positive.  The various methods of treatment have been discussed with the patient and family. After consideration of risks, benefits and other options for treatment, the patient has consented to  Procedure(s): PARTIAL MASTECTOMY WITH NEEDLE LOCALIZATION AND AXILLARY SENTINEL LYMPH NODE BX (Left) as a surgical intervention.  The patient's history has been reviewed, patient examined, no change in status, stable for surgery.  I have reviewed the patient's chart and labs.  Questions were answered to the patient's satisfaction.     Riley Hallum Lysle Pearl

## 2019-01-16 ENCOUNTER — Encounter: Payer: Self-pay | Admitting: Surgery

## 2019-01-17 LAB — SURGICAL PATHOLOGY

## 2019-01-18 ENCOUNTER — Other Ambulatory Visit: Payer: Self-pay | Admitting: Hematology and Oncology

## 2019-02-01 ENCOUNTER — Other Ambulatory Visit: Payer: Self-pay

## 2019-02-02 ENCOUNTER — Ambulatory Visit
Admission: RE | Admit: 2019-02-02 | Discharge: 2019-02-02 | Disposition: A | Discharge status: Home / Self Care | Payer: Medicare HMO | Source: Ambulatory Visit | Attending: Radiation Oncology | Admitting: Radiation Oncology

## 2019-02-02 ENCOUNTER — Other Ambulatory Visit: Payer: Self-pay

## 2019-02-02 ENCOUNTER — Encounter: Payer: Self-pay | Admitting: Radiation Oncology

## 2019-02-02 VITALS — BP 159/91 | HR 71 | Temp 99.2°F | Resp 16 | Wt 195.8 lb

## 2019-02-02 DIAGNOSIS — K219 Gastro-esophageal reflux disease without esophagitis: Secondary | ICD-10-CM | POA: Diagnosis not present

## 2019-02-02 DIAGNOSIS — C50212 Malignant neoplasm of upper-inner quadrant of left female breast: Secondary | ICD-10-CM | POA: Diagnosis not present

## 2019-02-02 DIAGNOSIS — E7800 Pure hypercholesterolemia, unspecified: Secondary | ICD-10-CM | POA: Insufficient documentation

## 2019-02-02 DIAGNOSIS — E119 Type 2 diabetes mellitus without complications: Secondary | ICD-10-CM | POA: Insufficient documentation

## 2019-02-02 DIAGNOSIS — I10 Essential (primary) hypertension: Secondary | ICD-10-CM | POA: Insufficient documentation

## 2019-02-02 DIAGNOSIS — Z170 Estrogen receptor positive status [ER+]: Secondary | ICD-10-CM | POA: Diagnosis not present

## 2019-02-02 DIAGNOSIS — Z79899 Other long term (current) drug therapy: Secondary | ICD-10-CM | POA: Diagnosis not present

## 2019-02-02 DIAGNOSIS — E039 Hypothyroidism, unspecified: Secondary | ICD-10-CM | POA: Insufficient documentation

## 2019-02-02 DIAGNOSIS — M129 Arthropathy, unspecified: Secondary | ICD-10-CM | POA: Insufficient documentation

## 2019-02-02 DIAGNOSIS — Z7982 Long term (current) use of aspirin: Secondary | ICD-10-CM | POA: Insufficient documentation

## 2019-02-02 NOTE — Consult Note (Signed)
NEW PATIENT EVALUATION  Name: Caitlin Ford  MRN: [id number]  Date:   02/02/2019     DOB: 1951/05/26   This 68 y.o. 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  REFERRING PHYSICIAN: Lequita Asal, MD  CHIEF COMPLAINT:  Chief Complaint  Patient presents with  . Breast Cancer    Initial consultation    DIAGNOSIS: The encounter diagnosis was Malignant neoplasm of upper-inner quadrant of left breast in female, estrogen receptor positive ([place]).   PREVIOUS INVESTIGATIONS:  Mammogram and ultrasound reviewed Pathology report reviewed Clinical notes reviewed  HPI: Patient is a 68 year old female who presented with an abnormal mammogram of her left breast.  There was architectural distortion in the upper inner quadrant of the left breast confirmed on ultrasound for which stereotactic core biopsy was recommended.  Biopsy was positive for 7 mm focus of invasive mammary carcinoma.  She went on to have a wide local excision and sentinel node biopsy.  Tumor was a low grade 1 invasive mammary carcinoma 6 mm in size.  Margins were clear except for medial margin which was close at 1 mm.  2 sentinel lymph nodes were negative for metastatic disease.  She is tolerated her treatments well.  She is seen today for radiation oncology opinion.  She specifically denies breast tenderness cough or bone pain.  Tumor was ER PR positive strongly and HER-2/neu negative.  PLANNED TREATMENT REGIMEN: Left whole breast radiation hypofractionated regiment  PAST MEDICAL HISTORY:  has a past medical history of Arthritis, Breast cancer ([place]), Diabetes mellitus without complication ([place]), GERD (gastroesophageal reflux disease), Goiter, Headache, Hypercholesteremia, Hypertension, Hypothyroidism, Proteinuria, and Wears  dentures.    PAST SURGICAL HISTORY:  Past Surgical History:  Procedure Laterality Date  . ABDOMINAL HYSTERECTOMY    . BREAST BIOPSY Left 12/14/2018   [facility]  . BREAST LUMPECTOMY Left 01/14/2019  . COLONOSCOPY WITH PROPOFOL N/A 02/09/2015   Procedure: COLONOSCOPY WITH PROPOFOL;  Surgeon: Hulen Luster, MD;  Location: [facility] ENDOSCOPY;  Service: Gastroenterology;  Laterality: N/A;  . PARTIAL MASTECTOMY WITH NEEDLE LOCALIZATION AND AXILLARY SENTINEL LYMPH NODE BX Left 01/14/2019   Procedure: PARTIAL MASTECTOMY WITH NEEDLE LOCALIZATION AND AXILLARY SENTINEL LYMPH NODE BX;  Surgeon: Benjamine Sprague, DO;  Location: [facility] ORS;  Service: General;  Laterality: Left;  . THYROIDECTOMY      FAMILY HISTORY: family history includes Diabetes in her maternal aunt, maternal uncle, and mother.  SOCIAL HISTORY:  reports that she has never smoked. She has never used smokeless tobacco. She reports that she does not drink alcohol or use drugs.  ALLERGIES: Altace [ramipril], Avandia [rosiglitazone], Crestor [rosuvastatin], Doxycycline, and Lipitor [atorvastatin]  MEDICATIONS:  Current Outpatient Medications  Medication Sig Dispense Refill  . acetaminophen (TYLENOL) 325 MG tablet Take 2 tablets (650 mg total) by mouth every 8 (eight) hours as needed for mild pain. 40 tablet 0  . aspirin EC 81 MG tablet Take 81 mg by mouth daily.     . cetirizine (ZYRTEC) 10 MG tablet Take 10 mg by mouth as needed.     . fluticasone (FLONASE) 50 MCG/ACT nasal spray Place 1 spray into both nostrils as needed.     [name] glimepiride (AMARYL) 2 MG tablet Take 2 mg by mouth daily with breakfast. Takes with the 4 mg glimepiride    . glimepiride (AMARYL)  4 MG tablet Take 4 mg by mouth daily with breakfast. Combines this with the 2 mg glimepiride    . ibuprofen (ADVIL) 800 MG tablet Take 1 tablet (800 mg total) by mouth every 8 (eight) hours as needed for mild pain or moderate pain. 30 tablet 0  . levothyroxine (SYNTHROID) 125 MCG tablet Take 125 mcg  by mouth daily before breakfast.     . losartan (COZAAR) 100 MG tablet Take 100 mg by mouth every morning.     . metFORMIN (GLUCOPHAGE-XR) 500 MG 24 hr tablet Take 1,000 mg by mouth 2 (two) times daily.     . metoprolol succinate (TOPROL-XL) 25 MG 24 hr tablet Take 25 mg by mouth daily. Unsure of dose/ just started 8/20    . Multiple Vitamin (MULTI-VITAMIN) tablet Take 1 tablet by mouth daily.     . simvastatin (ZOCOR) 40 MG tablet Take 40 mg by mouth daily at 6 PM.      No current facility-administered medications for this encounter.     ECOG PERFORMANCE STATUS:  0 - Asymptomatic  REVIEW OF SYSTEMS: Patient denies any weight loss, fatigue, weakness, fever, chills or night sweats. Patient denies any loss of vision, blurred vision. Patient denies any ringing  of the ears or hearing loss. No irregular heartbeat. Patient denies heart murmur or history of fainting. Patient denies any chest pain or pain radiating to her upper extremities. Patient denies any shortness of breath, difficulty breathing at night, cough or hemoptysis. Patient denies any swelling in the lower legs. Patient denies any nausea vomiting, vomiting of blood, or coffee ground material in the vomitus. Patient denies any stomach pain. Patient states has had normal bowel movements no significant constipation or diarrhea. Patient denies any dysuria, hematuria or significant nocturia. Patient denies any problems walking, swelling in the joints or loss of balance. Patient denies any skin changes, loss of hair or loss of weight. Patient denies any excessive worrying or anxiety or significant depression. Patient denies any problems with insomnia. Patient denies excessive thirst, polyuria, polydipsia. Patient denies any swollen glands, patient denies easy bruising or easy bleeding. Patient denies any recent infections, allergies or URI. Patient "s visual fields have not changed significantly in recent time.   PHYSICAL EXAM: BP (!) 159/91 (BP  Location: Left Arm, Patient Position: Sitting)   Pulse 71   Temp 99.2 F (37.3 C) (Tympanic)   Resp 16   Wt 195 lb 12.8 oz (88.8 kg)   BMI 30.67 kg/m  Left breast has a wide local excision scar located around the 12 o'clock position which is healing well.  Is also a separate sentinel node biopsy scar which is healing well.  She has a slight 1 cm nodule which is soft consistent with a possible small seroma.  This is located in the sentinel node biopsy scar.  No other dominant mass or nodularity is noted in either breast in 2 positions examined.  Well-developed well-nourished patient in NAD. HEENT reveals PERLA, EOMI, discs not visualized.  Oral cavity is clear. No oral mucosal lesions are identified. Neck is clear without evidence of cervical or supraclavicular adenopathy. Lungs are clear to A&P. Cardiac examination is essentially unremarkable with regular rate and rhythm without murmur rub or thrill. Abdomen is benign with no organomegaly or masses noted. Motor sensory and DTR levels are equal and symmetric in the upper and lower extremities. Cranial nerves II through XII are grossly intact. Proprioception is intact. No peripheral adenopathy or edema is identified. No motor  or sensory levels are noted. Crude visual fields are within normal range.  LABORATORY DATA: Pathology report reviewed    RADIOLOGY RESULTS: Mammogram and ultrasound reviewed compatible with above-stated findings   IMPRESSION: Stage I invasive mammary carcinoma grade 1 of the left breast status post wide local excision and sentinel node biopsy ER PR positive in 68 year old female  PLAN: At this time patient will see Dr. Mike Gip next week.  Based on the small tumor size and grade 1 tumor would doubt she would need systemic chemotherapy.  Dr. Mike Gip may opt to run an Oncotype DX if so we will postpone treatment still those results are finalized.  I have recommended hypofractionated course of left breast radiation over 3 weeks.   I would also boost her scar another 1600 centigrade based on the close DCIS margin.  Risks and benefits of treatment including skin reaction fatigue alteration blood counts possible occlusion of superficial lung all were described in detail to the patient.  She seems to comprehend my treatment plan well.  I have personally scheduled CT simulation for next week.  Patient also will benefit from antiestrogen therapy after completion of radiation.  I would like to take this opportunity to thank you for allowing me to participate in the care of your patient.Noreene Filbert, MD

## 2019-02-03 NOTE — Progress Notes (Signed)
[facility]  [address] [place], [place] [address] Phone: [phone]  Fax: [phone]   Clinic Day:  02/04/2019  Referring physician: Ezequiel Kayser, MD  Chief Complaint: Caitlin Ford is a 68 y.o. female with stage I left breast cancer who is seen for assessment after interval lumpectomy and discussion regarding direction of therapy.   HPI: The patient was last seen in the medical oncology clinic on 12/24/2018 for initial consultation. At that time, she denied any complaints.  Exam revealed biopsy changes in the left breast.   She underwent a left lumpectomy and axillary lymph node biopsy by Dr. Lysle Pearl on 01/14/2019. 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  Two sentinel lymph nodes werenegative for malignancy.  Tumor was ER positive, PR positive and HER-2/neu negative.  Pathologic stage was pT1b pN0.  She was seen in radiation oncology on 02/02/2019 by Dr. Baruch Gouty. He recommended a hypofractionated course of left breast radiation over 3 weeks with a boost to the scar of 1600 cGy based on the close DCIS margin.   During the interim, she has done well.  She is not interested in Oncotype DX testing.      Past Medical History:  Diagnosis Date  . Arthritis   . Breast cancer ([place])   . Diabetes mellitus without complication ([place])   . GERD (gastroesophageal reflux disease)    occ no meds  . Goiter   . Headache    simus  . Hypercholesteremia   . Hypertension   . Hypothyroidism   . Malignant neoplasm of upper-inner quadrant of left breast in female, estrogen receptor positive ([place]) 02/04/2019  . Proteinuria   . Wears dentures    partial upper, full lower    Past Surgical History:  Procedure Laterality Date  . ABDOMINAL HYSTERECTOMY    . BREAST BIOPSY Left 12/14/2018   [facility]  . BREAST LUMPECTOMY Left 01/14/2019  . COLONOSCOPY WITH PROPOFOL N/A 02/09/2015   Procedure: COLONOSCOPY  WITH PROPOFOL;  Surgeon: Hulen Luster, MD;  Location: [facility] ENDOSCOPY;  Service: Gastroenterology;  Laterality: N/A;  . PARTIAL MASTECTOMY WITH NEEDLE LOCALIZATION AND AXILLARY SENTINEL LYMPH NODE BX Left 01/14/2019   Procedure: PARTIAL MASTECTOMY WITH NEEDLE LOCALIZATION AND AXILLARY SENTINEL LYMPH NODE BX;  Surgeon: Benjamine Sprague, DO;  Location: [facility] ORS;  Service: General;  Laterality: Left;  . THYROIDECTOMY      Family History  Problem Relation Age of Onset  . Diabetes Mother   . Diabetes Maternal Aunt   . Diabetes Maternal Uncle   . Breast cancer Neg Hx     Social History:  reports that she has never smoked. She has never used smokeless tobacco. She reports that she does not drink alcohol or use drugs. She does not smoke. She drinks wine occasionally. She denies any known exposure to radiation or toxins. 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 She has two children. She lives in [place]. The patient is alone today.  Allergies:  Allergies  Allergen Reactions  . Altace [Ramipril] Other (See Comments)    Angioedema  . Avandia [Rosiglitazone] Other (See Comments)     Muscle Aches  . Crestor [Rosuvastatin] Other (See Comments)    Muscle ache  . Doxycycline Other (See Comments)    Muscle Aches  . Lipitor [Atorvastatin] Other (See Comments)    Muscle ache    Current Medications: Current Outpatient Medications  Medication  Sig Dispense Refill  . aspirin EC 81 MG tablet Take 81 mg by mouth daily.     . cetirizine (ZYRTEC) 10 MG tablet Take 10 mg by mouth as needed.     [name] glimepiride (AMARYL) 2 MG tablet Take 2 mg by mouth daily with breakfast. Takes with the 4 mg glimepiride    . glimepiride (AMARYL) 4 MG tablet Take 4 mg by mouth daily with breakfast. Combines this with the 2 mg glimepiride    . levothyroxine (SYNTHROID) 125 MCG tablet Take 125 mcg by mouth daily before breakfast.     . losartan (COZAAR) 100 MG tablet Take 100 mg by mouth every  morning.     . metFORMIN (GLUCOPHAGE-XR) 500 MG 24 hr tablet Take 1,000 mg by mouth 2 (two) times daily.     . metoprolol succinate (TOPROL-XL) 25 MG 24 hr tablet Take 25 mg by mouth daily. Unsure of dose/ just started 8/20    . Multiple Vitamin (MULTI-VITAMIN) tablet Take 1 tablet by mouth daily.     . simvastatin (ZOCOR) 40 MG tablet Take 40 mg by mouth daily at 6 PM.     . acetaminophen (TYLENOL) 325 MG tablet Take 2 tablets (650 mg total) by mouth every 8 (eight) hours as needed for mild pain. (Patient not taking: Reported on 02/04/2019) 40 tablet 0  . fluticasone (FLONASE) 50 MCG/ACT nasal spray Place 1 spray into both nostrils as needed.     [name] ibuprofen (ADVIL) 800 MG tablet Take 1 tablet (800 mg total) by mouth every 8 (eight) hours as needed for mild pain or moderate pain. (Patient not taking: Reported on 02/04/2019) 30 tablet 0   No current facility-administered medications for this visit.     Review of Systems  Constitutional: Negative.  Negative for chills, diaphoresis, fever, malaise/fatigue and weight loss (stable).       Feels well.  HENT: Negative.  Negative for congestion, ear pain, hearing loss, nosebleeds, sinus pain and sore throat.   Eyes: Negative.  Negative for blurred vision, double vision and photophobia.  Respiratory: Negative.  Negative for cough, sputum production, shortness of breath and wheezing.   Cardiovascular: Negative.  Negative for chest pain, palpitations, orthopnea, leg swelling and PND.  Gastrointestinal: Negative.  Negative for abdominal pain, blood in stool, constipation, diarrhea, melena, nausea and vomiting.  Genitourinary: Negative.  Negative for dysuria, frequency, hematuria and urgency.  Musculoskeletal: Positive for joint pain (bilateral knee pain). Negative for back pain and myalgias.  Skin: Negative.  Negative for rash.  Neurological: Negative.  Negative for dizziness, tingling, sensory change, speech change, weakness and headaches.   Endo/Heme/Allergies: Negative.  Does not bruise/bleed easily.  Psychiatric/Behavioral: Negative.  Negative for depression and memory loss. The patient is not nervous/anxious and does not have insomnia.   All other systems reviewed and are negative.  Performance status (ECOG): 0  Vitals Blood pressure 130/79, pulse 79, temperature (!) 97.4 F (36.3 C), temperature source Tympanic, resp. rate 18, height _0  (1.702 m), weight 193 lb 2 oz (87.6 kg), SpO2 100 %.   Physical Exam  Constitutional: She is oriented to person, place, and time. She appears well-developed and well-nourished. No distress.  HENT:  Head: Normocephalic and atraumatic.  Mouth/Throat: Oropharynx is clear and moist. No oropharyngeal exudate.  Short dark hair. Mask.   Eyes: Pupils are equal, round, and reactive to light. Conjunctivae and EOM are normal. No scleral icterus.  Glasses.  Brown eyes.  Neck: Normal range of motion. Neck supple.  No JVD present.  Cardiovascular: Normal rate, regular rhythm and normal heart sounds.  No murmur heard. Pulmonary/Chest: Breath sounds normal. No respiratory distress. She has no wheezes. She has no rales. Right breast exhibits no inverted nipple, no mass, no nipple discharge, no skin change and no tenderness. Left breast exhibits no inverted nipple, no mass, no nipple discharge and no tenderness. Breasts are asymmetrical (superior incision healing well; well healing left axillary incision with small hematoma).  Abdominal: Soft. Bowel sounds are normal. She exhibits no distension and no mass. There is no abdominal tenderness. There is no rebound and no guarding.  Musculoskeletal: Normal range of motion.        General: No edema.  Lymphadenopathy:    She has no cervical adenopathy.    She has no axillary adenopathy.       Right: No supraclavicular adenopathy present.       Left: No supraclavicular adenopathy present.  Neurological: She is oriented to person, place, and time.  Skin: Skin  is warm and dry. No rash noted. She is not diaphoretic. No erythema. No pallor.  Psychiatric: She has a normal mood and affect. Her behavior is normal. Judgment and thought content normal.  Nursing note and vitals reviewed.   No visits with results within 3 Day(s) from this visit.  Latest known visit with results is:  Admission on 01/14/2019, Discharged on 01/14/2019  Component Date Value Ref Range Status  . Glucose-Capillary 01/14/2019 185* 70 - 99 mg/dL Final  . SURGICAL PATHOLOGY 01/14/2019    Final                   Value:Surgical Pathology CASE: [id number] PATIENT: Caitlin Ford Surgical Pathology Report  SPECIMEN SUBMITTED: A. Breast mass, left B. Breast, left, posterior margin C. Sentinel node 1, from left breast D. Sentinel node 2, from left breast  CLINICAL HISTORY: None provided  PRE-OPERATIVE DIAGNOSIS: Malignant neoplasm of upper, inner quadrant of left breast in female, estrogen receptor positive  POST-OPERATIVE DIAGNOSIS: Malignant neoplasm of upper, inner quadrant of left breast  DIAGNOSIS: A. LEFT BREAST MASS, NEEDLE-LOCALIZED PARTIAL MASTECTOMY: - INVASIVE MAMMARY CARCINOMA. 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 - SEE CANCER SUMMARY BELOW.  B. LEFT BREAST, POSTERIOR MARGIN, EXCISION: - BENIGN BREAST TISSUE WITH PAPILLARY APOCRINE METAPLASIA. - NEGATIVE FOR MALIGNANCY.  C. SENTINEL LYMPH NODE #1, LEFT BREAST, EXCISIONAL BIOPSY: - ONE LYMPH NODE, NEGATIVE FOR METASTATIC CARCINOMA (0/1).  D. SENTINEL LYMPH NODE #2, LEFT BREAST, EXCIS                         IONAL BIOPSY: - ONE LYMPH NODE, NEGATIVE FOR METASTATIC CARCINOMA (0/1).  CANCER CASE SUMMARY: INVASIVE CARCINOMA OF THE BREAST Procedure: Needle localized partial mastectomy Specimen Laterality: Left Tumor Size: 6 mm Histologic Type: Invasive ductal carcinoma, NOS Histologic Grade (Nottingham Histologic Score)                      Glandular (Acinar)/Tubular Differentiation: 1                       Nuclear Pleomorphism: 2                      Mitotic Rate: 1                      Overall Grade: 1 Ductal Carcinoma In Situ (DCIS): Present, low-grade  Margins:                      Invasive Carcinoma Margins: Negative, see comment.                      DCIS Margins: 1 mm from medial Regional Lymph Nodes: Uninvolved (0/2) Treatment Effect in the Breast: Not applicable Lymphovascular Invasion: Not identified Pathologic Stage Classification (pTNM, AJCC 8th Edition): pT1b pN0 (sn) Breast Biomarker Testing Performed on Previous Biopsy: ARS 20-3297  BREAST BIOMARKER TESTS (fr                         om previous biopsy case [id number]): Estrogen Receptor (ER) Status: POSITIVE Progesterone Receptor (PgR) Status: POSITIVE HER2 (by immunohistochemistry): NEGATIVE (Score 1+)  Comment:  In specimen A, invasive carcinoma is <59m from posterior margin.  This margin was re-excised and is therefore widely negative.  GROSS DESCRIPTION: A.  Intraoperative Consultation:     Labeled: Left breast mass     Received: Fresh     Specimen: Oriented partial mastectomy with needle localization     Pathologic evaluation performed: Gross margin evaluation     Diagnosis: IOC: Lesion abuts posterior margin.     Communicated to: Dr. SLysle Pearlat 11:40 AM on 01/14/2019 by ABetsy Pries M.D.     Tissue submitted: Not applicable  A. Labeled: Left breast mass Received: Fresh for intraoperative consultation Accompanying specimen radiograph: Yes Radiographic findings: Needle localization wire and biopsy clip are present. Time in fixative: Collected at 11:11 AM and placed into formalin at 11                         :40 AM on 01/14/2019.  Cold ischemic time: Less than 1 hour Total fixation time: 11 hours Type of procedure: Oriented partial mastectomy with needle localization Location / laterality of specimen: Left Orientation of specimen: The specimen is oriented with a short suture designating  superior, a long suture designating lateral, and a double suture designating deep. Inking: Superior = blue Inferior = green Medial = yellow Lateral = orange Posterior = black Anterior/Superficial = red Size of specimen: 6.5 (superior-inferior) x 3.7 (medial-lateral) x 1.8 cm (anterior-posterior) Skin: Absent Biopsy site: Present Number of discrete masses: 1 Size of mass(es): 1.3 x 1.0 x 0.6 cm Description of mass(es): Sectioning displays an ill-defined, spiculated mass with a pale-tan cut surface.  Embedded within the mass is a ribbon-shaped biopsy clip.  Adjacent and extending superiorly from the mass are multifocal areas of yellow-orange discoloration admixed with hemorrhage (susp                         icious for biopsy site changes) with an overall measurement of 1.5 x 1.3 x 0.5 cm.  The biopsy site changes come within 0.4 cm of the closest surgical resection margin (posterior). Distance between masses/clips: Not applicable Margins in relation to mass: Superior - 1.5 cm, inferior - 2.2 cm, anterior - 0.5 cm, posterior - less than 0.1 cm, medial - 0.4 cm, and lateral - 1.1 cm. Description of remainder of tissue: Sectioning the remainder of the specimen displays tan-yellow, lobulated, otherwise grossly unremarkable fibroadipose tissue with a fibrous to adipose ratio of 5:95.  No additional abnormalities or mass lesions are grossly identified.  Block summary: 1-8 - full-thickness sections displaying entire mass in relation to anterior, posterior, medial, and lateral resection  margins (submitted from inferior to superior; biopsy clip site in cassette 3; two, bisected full-thickness sections submitted in cassettes 5-6 and 7-8) 9-14 - full-thickness, bisected sections dis                         playing entire biopsy site changes immediately superior to mass (in relation to anterior, posterior, medial, and lateral resection margins; submitted from inferior to superior;  bisected pairs submitted as follows: 9-10, 11-12, 13-14) 15 - superior resection margin closest to mass/biopsy site changes 16 - inferior resection margin closest to mass  B.  Intraoperative Consultation:     Labeled: Posterior margin of left breast     Received: Fresh     Specimen: Oriented excisional breast biopsy     Pathologic evaluation performed: Gross margin evaluation     Diagnosis: IOC: New margin grossly negative.     Communicated to: Dr. Lysle Pearl at 11:53 AM on 01/14/2019 by Betsy Pries, M.D.     Tissue submitted: Not applicable  B. Labeled: Posterior margin of left breast Received: Fresh for intraoperative consultation Accompanying specimen radiograph: No Radiographic findings: Not applicable Time in fixative: Collected at 11:41 AM and placed into formalin at 11:53 AM on 01/14/19                         20.  Cold ischemic time: Less than 1 hour Total fixation time: 10.5 hours Type of procedure: Oriented excisional breast biopsy Location / laterality of specimen: Left breast; new, posterior surgical resection margin Orientation of specimen: The specimen is oriented with a short suture designating superior, a long suture designating lateral, and a double suture designating deep. Inking: Superior = blue Inferior = green Medial = yellow Lateral = orange Posterior = black (new, posterior resection margin) Anterior/Superficial = red (old, posterior resection margin) Size of specimen: 3.3 (superior-inferior) x 2.3 (medial-lateral) x 1.2 (anterior-posterior) Skin: Absent Description of tissue: Sectioning displays a 0.9 x 0.6 x 0.3 cm focal area of slightly irregular, pale tan fibrous tissue extending along the anterior surgical resection margin (old, posterior resection margin); suspicious for possible residual lesional tissue.  The slightly irregular fibrous tissue co                         mes within 0.5 cm of the new, posterior surgical resection margin.   Sectioning the remainder of the specimen displays tan-yellow, lobulated, otherwise grossly unremarkable fibroadipose tissue with a fibrous to adipose ratio of 5:95.  No additional abnormalities or distinct mass lesions are grossly identified.  The specimen is submitted entirely from superior to inferior as follows: 1 - entire, perpendicularly sectioned superior resection margin 2-3 - full-thickness, central sections displaying slightly irregular fibrous tissue in relation to anterior, posterior, medial, and lateral resection margins (submitted from superior to inferior) 4 - entire, perpendicularly sectioned inferior resection margin  C. Labeled: Sentinel node #1 from left breast Received: Formalin Tissue fragment(s): 1 Size: 2.1 x 1.5 x 0.9 cm Description: Received is an irregular fragment of tan-yellow adipose tissue.  Palpation reveals 1 lymph node candidate measuring 1.7 x 1.3 x 0.7. The lymph n                         ode is trisected and entirely submitted in cassette 1.  D. Labeled: Sentinel node #2 from left breast Received: Formalin Tissue fragment(s): 1 Size: 2.2  x 2.0 x 1.0 cm Description: Received is an irregular fragment of tan-yellow adipose tissue.  Palpation reveals 1 lymph node candidate measuring 0.9 x 0.5 x 0.5 cm. The lymph node is bisected and entirely submitted in cassette 1.   Final Diagnosis performed by Betsy Pries, MD.   Electronically signed 01/17/2019 4:03:41PM The electronic signature indicates that the named Attending Pathologist has evaluated the specimen  Technical component performed at [facility], [address], [place], [place] [address] Lab: [phone] Dir: Rush Farmer, MD, MMM  Professional component performed at [facility], [facility], [place], [place], [place] [address] Lab: [phone] Dir: Dellia Nims. Rubinas, MD   . Glucose-Capillary 01/14/2019 179* 70 - 99 mg/dL Final    Assessment:  Caitlin Ford is a  68 y.o. female with stage IA left breast cancer s/p lumpectomy and axillary lymph node biopsy on 01/14/2019.  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  Two sentinel lymph nodes negative for malignancy.  Tumor was ER + >90%, PR + >90%, and HER-2/neu negative (1+).  Pathologic stage was pT1b pN0.   Bilateral screening mammogram on 11/25/2018 revealed a possible distortion in the left breast. Bilateral diagnostic mammogram on 12/02/2018 revealed a persistent left breast distortion in the upper inner quadrant, middle depth.   Bone density on 10/21/2018 was normal with a T-score of +0.3 in the left femoral neck and + 1.3 in AP spine L1-L4.    Symptomatically, she is doing well.  Exam reveals mild post surgical changes.  Plan: 1.   Clinical stage I left breast cancer             Discuss interval surgery.  Review pathology in detail.  Discuss close DCIS margin.  Review plan for radiation and boost to the scar.  Discuss good features of tumor (small, low grade, hormone receptor +, lymph node -).  As tumor > 5 mm, discuss consideration of Oncotype DX testing.   Patient notes not interested in chemotherapy.   Patient agreeable to no testing. Discuss plan for 5 years of adjuvant endocrine therapy.   Review side effects of tamoxifen vs aromatase inhibitors.   Review bone density- normal.   Confirm plan for initiation of Femara after radiation. 2.   Patient to call after radiation for MD assessment, labs (CBC with diff, CMP, CA27.29) and initiation of Femara.  I discussed the assessment and treatment plan with the patient.  The patient was provided an opportunity to ask questions and all were answered.  The patient agreed with the plan and demonstrated an understanding of the instructions.  The patient was advised to call back if the symptoms worsen or if the condition fails to improve as anticipated.   Lequita Asal, MD, PhD     02/04/2019, 3:35 PM

## 2019-02-04 ENCOUNTER — Encounter: Payer: Self-pay | Admitting: Hematology and Oncology

## 2019-02-04 ENCOUNTER — Other Ambulatory Visit: Payer: Self-pay

## 2019-02-04 ENCOUNTER — Inpatient Hospital Stay: Payer: Medicare HMO | Attending: Hematology and Oncology | Admitting: Hematology and Oncology

## 2019-02-04 VITALS — BP 130/79 | HR 79 | Temp 97.4°F | Resp 18 | Ht 67.0 in | Wt 193.1 lb

## 2019-02-04 DIAGNOSIS — I10 Essential (primary) hypertension: Secondary | ICD-10-CM | POA: Diagnosis not present

## 2019-02-04 DIAGNOSIS — C50212 Malignant neoplasm of upper-inner quadrant of left female breast: Secondary | ICD-10-CM

## 2019-02-04 DIAGNOSIS — E7800 Pure hypercholesterolemia, unspecified: Secondary | ICD-10-CM | POA: Diagnosis not present

## 2019-02-04 DIAGNOSIS — M1990 Unspecified osteoarthritis, unspecified site: Secondary | ICD-10-CM | POA: Diagnosis not present

## 2019-02-04 DIAGNOSIS — Z7982 Long term (current) use of aspirin: Secondary | ICD-10-CM | POA: Insufficient documentation

## 2019-02-04 DIAGNOSIS — Z791 Long term (current) use of non-steroidal anti-inflammatories (NSAID): Secondary | ICD-10-CM | POA: Insufficient documentation

## 2019-02-04 DIAGNOSIS — C50912 Malignant neoplasm of unspecified site of left female breast: Secondary | ICD-10-CM | POA: Insufficient documentation

## 2019-02-04 DIAGNOSIS — K219 Gastro-esophageal reflux disease without esophagitis: Secondary | ICD-10-CM | POA: Insufficient documentation

## 2019-02-04 DIAGNOSIS — Z170 Estrogen receptor positive status [ER+]: Secondary | ICD-10-CM | POA: Diagnosis not present

## 2019-02-04 DIAGNOSIS — Z79899 Other long term (current) drug therapy: Secondary | ICD-10-CM | POA: Diagnosis not present

## 2019-02-04 DIAGNOSIS — E039 Hypothyroidism, unspecified: Secondary | ICD-10-CM | POA: Insufficient documentation

## 2019-02-04 DIAGNOSIS — Z7984 Long term (current) use of oral hypoglycemic drugs: Secondary | ICD-10-CM | POA: Diagnosis not present

## 2019-02-04 DIAGNOSIS — E119 Type 2 diabetes mellitus without complications: Secondary | ICD-10-CM | POA: Diagnosis not present

## 2019-02-04 HISTORY — DX: Malignant neoplasm of upper-inner quadrant of left female breast: C50.212

## 2019-02-04 HISTORY — DX: Malignant neoplasm of upper-inner quadrant of left female breast: Z17.0

## 2019-02-04 NOTE — Patient Instructions (Signed)
Letrozole tablets What is this medicine? LETROZOLE (LET roe zole) blocks the production of estrogen. It is used to treat breast cancer. This medicine may be used for other purposes; ask your health care provider or pharmacist if you have questions. COMMON BRAND NAME(S): Femara What should I tell my health care provider before I take this medicine? They need to know if you have any of these conditions:  high cholesterol  liver disease  osteoporosis (weak bones)  an unusual or allergic reaction to letrozole, other medicines, foods, dyes, or preservatives  pregnant or trying to get pregnant  breast-feeding How should I use this medicine? Take this medicine by mouth with a glass of water. You may take it with or without food. Follow the directions on the prescription label. Take your medicine at regular intervals. Do not take your medicine more often than directed. Do not stop taking except on your doctor's advice. Talk to your pediatrician regarding the use of this medicine in children. Special care may be needed. Overdosage: If you think you have taken too much of this medicine contact a poison control center or emergency room at once. NOTE: This medicine is only for you. Do not share this medicine with others. What if I miss a dose? If you miss a dose, take it as soon as you can. If it is almost time for your next dose, take only that dose. Do not take double or extra doses. What may interact with this medicine? Do not take this medicine with any of the following medications:  estrogens, like hormone replacement therapy or birth control pills This medicine may also interact with the following medications:  dietary supplements such as androstenedione or DHEA  prasterone  tamoxifen This list may not describe all possible interactions. Give your health care provider a list of all the medicines, herbs, non-prescription drugs, or dietary supplements you use. Also tell them if you  smoke, drink alcohol, or use illegal drugs. Some items may interact with your medicine. What should I watch for while using this medicine? Tell your doctor or healthcare professional if your symptoms do not start to get better or if they get worse. Do not become pregnant while taking this medicine or for 3 weeks after stopping it. Women should inform their doctor if they wish to become pregnant or think they might be pregnant. There is a potential for serious side effects to an unborn child. Talk to your health care professional or pharmacist for more information. Do not breast-feed while taking this medicine or for 3 weeks after stopping it. This medicine may interfere with the ability to have a child. Talk with your doctor or health care professional if you are concerned about your fertility. Using this medicine for a long time may increase your risk of low bone mass. Talk to your doctor about bone health. You may get drowsy or dizzy. Do not drive, use machinery, or do anything that needs mental alertness until you know how this medicine affects you. Do not stand or sit up quickly, especially if you are an older patient. This reduces the risk of dizzy or fainting spells. You may need blood work done while you are taking this medicine. What side effects may I notice from receiving this medicine? Side effects that you should report to your doctor or health care professional as soon as possible:  allergic reactions like skin rash, itching, or hives  bone fracture  chest pain  signs and symptoms of a blood clot  such as breathing problems; changes in vision; chest pain; severe, sudden headache; pain, swelling, warmth in the leg; trouble speaking; sudden numbness or weakness of the face, arm or leg  vaginal bleeding Side effects that usually do not require medical attention (report to your doctor or health care professional if they continue or are bothersome):  bone, back, joint, or muscle  pain  dizziness  fatigue  fluid retention  headache  hot flashes, night sweats  nausea  weight gain This list may not describe all possible side effects. Call your doctor for medical advice about side effects. You may report side effects to FDA at 1-800-FDA-1088. Where should I keep my medicine? Keep out of the reach of children. Store between 15 and 30 degrees C (59 and 86 degrees F). Throw away any unused medicine after the expiration date. NOTE: This sheet is a summary. It may not cover all possible information. If you have questions about this medicine, talk to your doctor, pharmacist, or health care provider.  2020 Elsevier/Gold Standard (2015-12-17 11:10:41)  

## 2019-02-04 NOTE — Progress Notes (Signed)
No new changes noted today 

## 2019-02-07 ENCOUNTER — Other Ambulatory Visit: Payer: Self-pay

## 2019-02-07 NOTE — Progress Notes (Incomplete)
[facility]  [address] [place], [place] [address] Phone: [phone]  Fax: [phone]   Clinic Day:  02/07/2019  Referring physician: Benjamine Sprague, DO  Chief Complaint: Caitlin Ford is a 68 y.o. female with invasive mammary carcinoma who is seen for assessment  HPI: The patient was last seen in the medical oncology clinic on 12/24/2018. At that time, she denied any fevers, fatigue, sweats, or weight loss. She denied any headaches or visual changes. She denied any runny nose, cough, shortness of breath, or chest pain. She denied any nausea, vomiting, or diarrhea. She denied any blood in her stools, black stools, or blood in her urine. She noted bilateral knee pain that both "pop" when she stands after sitting for a while. She denied any pain or discomfort at the biopsy site.   Left lumpectomy on 01/14/2019 [phone]) showed Malignant neoplasm of upper, inner quadrant of left breast. Two lymph nodes negative for malignancy.   During the interim, ***  Past Medical History:  Diagnosis Date   Arthritis    Breast cancer ([place])    Diabetes mellitus without complication ([place])    GERD (gastroesophageal reflux disease)    occ no meds   Goiter    Headache    simus   Hypercholesteremia    Hypertension    Hypothyroidism    Malignant neoplasm of upper-inner quadrant of left breast in female, estrogen receptor positive ([place]) 02/04/2019   Proteinuria    Wears dentures    partial upper, full lower    Past Surgical History:  Procedure Laterality Date   ABDOMINAL HYSTERECTOMY     BREAST BIOPSY Left 12/14/2018   [facility]   BREAST LUMPECTOMY Left 01/14/2019   COLONOSCOPY WITH PROPOFOL N/A 02/09/2015   Procedure: COLONOSCOPY WITH PROPOFOL;  Surgeon: Hulen Luster, MD;  Location: [facility] END[facility];  Service: Gastroenterology;  Laterality: N/A;   PARTIAL MASTECTOMY WITH NEEDLE LOCALIZATION AND AXILLARY SENTINEL LYMPH NODE BX Left 01/14/2019   Procedure: PARTIAL MASTECTOMY WITH NEEDLE LOCALIZATION AND AXILLARY SENTINEL LYMPH NODE BX;  Surgeon: Benjamine Sprague, DO;  Location: [facility] ORS;  Service: General;  Laterality: Left;   THYROIDECTOMY      Family History  Problem Relation Age of Onset   Diabetes Mother    Diabetes Maternal Aunt    Diabetes Maternal Uncle    Breast cancer Neg Hx     Social History:  reports that she has never smoked. She has never used smokeless tobacco. She reports that she does not drink alcohol or use drugs.The patient is ***alone/accompanied by *** today.  Allergies:  Allergies  Allergen Reactions   Altace [Ramipril] Other (See Comments)    Angioedema   Avandia [Rosiglitazone] Other (See Comments)     Muscle Aches   Crestor [Rosuvastatin] Other (See Comments)    Muscle ache   Doxycycline Other (See Comments)    Muscle Aches   Lipitor [Atorvastatin] Other (See Comments)    Muscle ache    Current Medications: Current Outpatient Medications  Medication Sig Dispense Refill   acetaminophen (TYLENOL) 325 MG tablet Take 2 tablets (650 mg total) by mouth every 8 (eight) hours as needed for mild pain. (Patient not taking: Reported on 02/04/2019) 40 tablet 0   aspirin EC 81 MG tablet Take 81 mg by mouth daily.      cetirizine (ZYRTEC) 10 MG tablet Take 10 mg by mouth as needed.      fluticasone (FLONASE) 50 MCG/ACT nasal spray Place 1 spray into  both nostrils as needed.      glimepiride (AMARYL) 2 MG tablet Take 2 mg by mouth daily with breakfast. Takes with the 4 mg glimepiride     glimepiride (AMARYL) 4 MG tablet Take 4 mg by mouth daily with breakfast. Combines this with the 2 mg glimepiride     ibuprofen (ADVIL) 800 MG tablet Take 1 tablet (800 mg total) by mouth every 8 (eight) hours as needed for mild pain or moderate pain. (Patient not taking: Reported on 02/04/2019) 30 tablet 0   levothyroxine (SYNTHROID) 125 MCG tablet Take 125 mcg by mouth daily before breakfast.      losartan  (COZAAR) 100 MG tablet Take 100 mg by mouth every morning.      metFORMIN (GLUCOPHAGE-XR) 500 MG 24 hr tablet Take 1,000 mg by mouth 2 (two) times daily.      metoprolol succinate (TOPROL-XL) 25 MG 24 hr tablet Take 25 mg by mouth daily. Unsure of dose/ just started 8/20     Multiple Vitamin (MULTI-VITAMIN) tablet Take 1 tablet by mouth daily.      simvastatin (ZOCOR) 40 MG tablet Take 40 mg by mouth daily at 6 PM.      No current facility-administered medications for this visit.     ROS  Performance status (ECOG): ***  Vitals There were no vitals taken for this visit.   Physical Exam   No visits with results within 3 Day(s) from this visit.  Latest known visit with results is:  Admission on 01/14/2019, Discharged on 01/14/2019  Component Date Value Ref Range Status   Glucose-Capillary 01/14/2019 185* 70 - 99 mg/dL Final   SURGICAL PATHOLOGY 01/14/2019    Final                   Value:Surgical Pathology CASE: [id number] PATIENT: Caitlin Ford Surgical Pathology Report     SPECIMEN SUBMITTED: A. Breast mass, left B. Breast, left, posterior margin C. Sentinel node 1, from left breast D. Sentinel node 2, from left breast  CLINICAL HISTORY: None provided  PRE-OPERATIVE DIAGNOSIS: Malignant neoplasm of upper, inner quadrant of left breast in female, estrogen receptor positive  POST-OPERATIVE DIAGNOSIS: Malignant neoplasm of upper, inner quadrant of left breast     DIAGNOSIS: A. LEFT BREAST MASS, NEEDLE-LOCALIZED PARTIAL MASTECTOMY: - INVASIVE MAMMARY CARCINOMA. 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 - SEE CANCER SUMMARY BELOW.  B. LEFT BREAST, POSTERIOR MARGIN, EXCISION: - BENIGN BREAST TISSUE WITH PAPILLARY APOCRINE METAPLASIA. - NEGATIVE FOR MALIGNANCY.  C. SENTINEL LYMPH NODE #1, LEFT BREAST, EXCISIONAL BIOPSY: - ONE LYMPH NODE, NEGATIVE FOR METASTATIC CARCINOMA (0/1).  D. SENTINEL LYMPH NODE #2, LEFT BREAST, EXCIS                          IONAL BIOPSY: - ONE LYMPH NODE, NEGATIVE FOR METASTATIC CARCINOMA (0/1).  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                      DCIS Margins: 1 mm from medial Regional Lymph Nodes: Uninvolved (0/2) Treatment Effect in the Breast: Not applicable Lymphovascular Invasion: Not identified Pathologic Stage Classification (pTNM, AJCC 8th Edition): pT1b pN0 (sn) Breast Biomarker Testing Performed on Previous Biopsy: [id number]  BREAST BIOMARKER TESTS (fr                         om previous biopsy case [id number]): Estrogen Receptor (ER) Status: POSITIVE Progesterone Receptor (PgR) Status: POSITIVE HER2 (by immunohistochemistry): NEGATIVE (Score 1+)  Comment:  In specimen A, invasive carcinoma is <28m from posterior margin.  This margin was re-excised and is therefore widely negative.  GROSS DESCRIPTION: A.  Intraoperative Consultation:     Labeled: Left breast mass     Received: Fresh     Specimen: Oriented partial mastectomy with needle localization     Pathologic evaluation performed: Gross margin evaluation     Diagnosis: IOC: Lesion abuts posterior margin.     Communicated to: Dr. SLysle Pearlat 11:40 AM on 01/14/2019 by ABetsy Pries M.D.     Tissue submitted: Not applicable  A. Labeled: Left breast mass Received: Fresh for intraoperative consultation Accompanying specimen radiograph: Yes Radiographic findings: Needle localization wire and biopsy clip  are present. Time in fixative: Collected at 11:11 AM and placed into formalin at 11                         :40 AM on 01/14/2019.  Cold ischemic time: Less than 1 hour Total fixation time: 11 hours Type of procedure: Oriented partial mastectomy with needle localization Location / laterality of specimen: Left Orientation of specimen: The specimen is oriented with a short suture designating superior, a long suture designating lateral, and a double suture designating deep. Inking: Superior = blue Inferior = green Medial = yellow Lateral = orange Posterior = black Anterior/Superficial = red Size of specimen: 6.5 (superior-inferior) x 3.7 (medial-lateral) x 1.8 cm (anterior-posterior) Skin: Absent Biopsy site: Present Number of discrete masses: 1 Size of mass(es): 1.3 x 1.0 x 0.6 cm Description of mass(es): Sectioning displays an ill-defined, spiculated mass with a pale-tan cut surface.  Embedded within the mass is a ribbon-shaped biopsy clip.  Adjacent and extending superiorly from the mass are multifocal areas of yellow-orange discoloration admixed with hemorrhage (susp                         icious for biopsy site changes) with an overall measurement of 1.5 x 1.3 x 0.5 cm.  The biopsy site changes come within 0.4 cm of the closest surgical resection margin (posterior). Distance between masses/clips: Not applicable Margins in relation to mass: Superior - 1.5 cm, inferior - 2.2 cm, anterior - 0.5 cm, posterior - less than 0.1 cm, medial - 0.4 cm, and lateral - 1.1 cm. Description of remainder of tissue: Sectioning the remainder of the specimen displays tan-yellow, lobulated, otherwise grossly unremarkable fibroadipose tissue with a fibrous to adipose ratio of 5:95.  No additional abnormalities or mass lesions are grossly identified.  Block summary: 1-8 - full-thickness sections displaying entire  mass in relation to anterior, posterior, medial, and lateral resection margins  (submitted from inferior to superior; biopsy clip site in cassette 3; two, bisected full-thickness sections submitted in cassettes 5-6 and 7-8) 9-14 - full-thickness, bisected sections dis                         playing entire biopsy site changes immediately superior to mass (in relation to anterior, posterior, medial, and lateral resection margins; submitted from inferior to superior; bisected pairs submitted as follows: 9-10, 11-12, 13-14) 15 - superior resection margin closest to mass/biopsy site changes 16 - inferior resection margin closest to mass  B.  Intraoperative Consultation:     Labeled: Posterior margin of left breast     Received: Fresh     Specimen: Oriented excisional breast biopsy     Pathologic evaluation performed: Gross margin evaluation     Diagnosis: IOC: New margin grossly negative.     Communicated to: Dr. Lysle Pearl at 11:53 AM on 01/14/2019 by Betsy Pries, M.D.     Tissue submitted: Not applicable  B. Labeled: Posterior margin of left breast Received: Fresh for intraoperative consultation Accompanying specimen radiograph: No Radiographic findings: Not applicable Time in fixative: Collected at 11:41 AM and placed into formalin at 11:53 AM on 01/14/19                         20.  Cold ischemic time: Less than 1 hour Total fixation time: 10.5 hours Type of procedure: Oriented excisional breast biopsy Location / laterality of specimen: Left breast; new, posterior surgical resection margin Orientation of specimen: The specimen is oriented with a short suture designating superior, a long suture designating lateral, and a double suture designating deep. Inking: Superior = blue Inferior = green Medial = yellow Lateral = orange Posterior = black (new, posterior resection margin) Anterior/Superficial = red (old, posterior resection margin) Size of specimen: 3.3 (superior-inferior) x 2.3 (medial-lateral) x 1.2 (anterior-posterior) Skin: Absent Description  of tissue: Sectioning displays a 0.9 x 0.6 x 0.3 cm focal area of slightly irregular, pale tan fibrous tissue extending along the anterior surgical resection margin (old, posterior resection margin); suspicious for possible residual lesional tissue.  The slightly irregular fibrous tissue co                         mes within 0.5 cm of the new, posterior surgical resection margin.  Sectioning the remainder of the specimen displays tan-yellow, lobulated, otherwise grossly unremarkable fibroadipose tissue with a fibrous to adipose ratio of 5:95.  No additional abnormalities or distinct mass lesions are grossly identified.  The specimen is submitted entirely from superior to inferior as follows: 1 - entire, perpendicularly sectioned superior resection margin 2-3 - full-thickness, central sections displaying slightly irregular fibrous tissue in relation to anterior, posterior, medial, and lateral resection margins (submitted from superior to inferior) 4 - entire, perpendicularly sectioned inferior resection margin  C. Labeled: Sentinel node #1 from left breast Received: Formalin Tissue fragment(s): 1 Size: 2.1 x 1.5 x 0.9 cm Description: Received is an irregular fragment of tan-yellow adipose tissue.  Palpation reveals 1 lymph node candidate measuring 1.7 x 1.3 x 0.7. The lymph n                         ode is trisected and entirely submitted in cassette 1.  D. Labeled: Sentinel node #2  from left breast Received: Formalin Tissue fragment(s): 1 Size: 2.2 x 2.0 x 1.0 cm Description: Received is an irregular fragment of tan-yellow adipose tissue.  Palpation reveals 1 lymph node candidate measuring 0.9 x 0.5 x 0.5 cm. The lymph node is bisected and entirely submitted in cassette 1.   Final Diagnosis performed by Betsy Pries, MD.   Electronically signed 01/17/2019 4:03:41PM The electronic signature indicates that the named Attending Pathologist has evaluated the specimen  Technical  component performed at [facility], [address], [place], [place] [address] Lab: [phone] Dir: Rush Farmer, MD, MMM  Professional component performed at [facility], [facility], [place], [place], [place] [address] Lab: [phone] Dir: Dellia Nims. Rubinas, MD    Glucose-Capillary 01/14/2019 179* 70 - 99 mg/dL Final    Assessment:  Caitlin Ford is a 68 y.o. female with clinical stage I left breast cancer s/p biopsy on 12/14/2018.  Pathology revealed a 7 mm grade I invasive mammary carcinoma. ER + >90%, PR + >90%, and HER-2/neu negative (1+).   Bilateral screening mammogram on 11/25/2018 revealed a possible distortion in the left breast. Bilateral diagnostic mammogram on 12/02/2018 revealed a persistent left breast distortion in the upper inner quadrant, middle depth.   Bone density on 10/21/2018 was normal with a T-score of +0.3 in the left femoral neck and + 1.3 in AP spine L1-L4.   Symptomatically, ***    Plan: 1.   Labs today: liver function tests, CA27.29. 2.   Clinical stage I left breast cancer             Discuss surgical resection (lumpectomy and SLN biopsy with Dr. Lysle Pearl) Discuss radiation treatment. Plan consultation with Dr. Baruch Gouty.              Discuss OncotypeDx testing to determine chemotherapy benefit.                          Given small grade I tumor, suspect low risk Oncotype DX and treatment with endocrine therapy alone. Discuss plan for adjuvant endocrine therapy.                         Discuss tamoxifen versus aromatase inhibitors.                         Information provided on letrozole and tamoxifen. 3.   Consult radiation oncology. 4.   RTC 2 weeks following surgery for MD assessment, review of pathology, and finalization of treatment plan.   I discussed the assessment and treatment plan with the patient.  The patient was provided an opportunity to ask questions and all were answered.  The patient agreed  with the plan and demonstrated an understanding of the instructions.  The patient was advised to call back if the symptoms worsen or if the condition fails to improve as anticipated.  I provided *** minutes of face-to-face time during this this encounter and > 50% was spent counseling as documented under my assessment and plan.    Lequita Asal, MD, PhD    02/07/2019, 3:40 AM  I, Jacqualyn Posey, am acting as Education administrator for [employer]. Mike Gip, MD, PhD.  {Add scribe attestation statement}

## 2019-02-08 ENCOUNTER — Other Ambulatory Visit: Payer: Self-pay

## 2019-02-08 ENCOUNTER — Ambulatory Visit
Admission: RE | Admit: 2019-02-08 | Discharge: 2019-02-08 | Disposition: A | Discharge status: Home / Self Care | Payer: Medicare HMO | Source: Ambulatory Visit | Attending: Radiation Oncology | Admitting: Radiation Oncology

## 2019-02-08 DIAGNOSIS — Z170 Estrogen receptor positive status [ER+]: Secondary | ICD-10-CM | POA: Diagnosis not present

## 2019-02-08 DIAGNOSIS — Z510 Encounter for antineoplastic radiation therapy: Secondary | ICD-10-CM | POA: Diagnosis present

## 2019-02-08 DIAGNOSIS — C50212 Malignant neoplasm of upper-inner quadrant of left female breast: Secondary | ICD-10-CM | POA: Diagnosis not present

## 2019-02-09 DIAGNOSIS — Z510 Encounter for antineoplastic radiation therapy: Secondary | ICD-10-CM | POA: Diagnosis not present

## 2019-02-10 ENCOUNTER — Other Ambulatory Visit: Payer: Self-pay | Admitting: Gerontology

## 2019-02-10 ENCOUNTER — Other Ambulatory Visit (HOSPITAL_COMMUNITY): Payer: Self-pay | Admitting: Gerontology

## 2019-02-10 DIAGNOSIS — M542 Cervicalgia: Secondary | ICD-10-CM

## 2019-02-11 ENCOUNTER — Other Ambulatory Visit: Payer: Self-pay | Admitting: *Deleted

## 2019-02-11 DIAGNOSIS — C50212 Malignant neoplasm of upper-inner quadrant of left female breast: Secondary | ICD-10-CM

## 2019-02-11 DIAGNOSIS — Z170 Estrogen receptor positive status [ER+]: Secondary | ICD-10-CM

## 2019-02-15 ENCOUNTER — Other Ambulatory Visit: Payer: Self-pay

## 2019-02-15 ENCOUNTER — Ambulatory Visit
Admission: RE | Admit: 2019-02-15 | Discharge: 2019-02-15 | Disposition: A | Discharge status: Home / Self Care | Payer: Medicare HMO | Source: Ambulatory Visit | Attending: Radiation Oncology | Admitting: Radiation Oncology

## 2019-02-15 DIAGNOSIS — Z510 Encounter for antineoplastic radiation therapy: Secondary | ICD-10-CM | POA: Diagnosis not present

## 2019-02-16 ENCOUNTER — Other Ambulatory Visit: Payer: Self-pay

## 2019-02-16 ENCOUNTER — Ambulatory Visit
Admission: RE | Admit: 2019-02-16 | Discharge: 2019-02-16 | Disposition: A | Discharge status: Home / Self Care | Payer: Medicare HMO | Source: Ambulatory Visit | Attending: Radiation Oncology | Admitting: Radiation Oncology

## 2019-02-16 DIAGNOSIS — Z510 Encounter for antineoplastic radiation therapy: Secondary | ICD-10-CM | POA: Diagnosis not present

## 2019-02-17 ENCOUNTER — Other Ambulatory Visit: Payer: Self-pay

## 2019-02-17 ENCOUNTER — Ambulatory Visit
Admission: RE | Admit: 2019-02-17 | Discharge: 2019-02-17 | Disposition: A | Discharge status: Home / Self Care | Payer: Medicare HMO | Source: Ambulatory Visit | Attending: Radiation Oncology | Admitting: Radiation Oncology

## 2019-02-17 DIAGNOSIS — Z510 Encounter for antineoplastic radiation therapy: Secondary | ICD-10-CM | POA: Diagnosis not present

## 2019-02-18 ENCOUNTER — Other Ambulatory Visit: Payer: Self-pay

## 2019-02-18 ENCOUNTER — Ambulatory Visit
Admission: RE | Admit: 2019-02-18 | Discharge: 2019-02-18 | Disposition: A | Discharge status: Home / Self Care | Payer: Medicare HMO | Source: Ambulatory Visit | Attending: Radiation Oncology | Admitting: Radiation Oncology

## 2019-02-18 DIAGNOSIS — Z510 Encounter for antineoplastic radiation therapy: Secondary | ICD-10-CM | POA: Diagnosis not present

## 2019-02-21 ENCOUNTER — Ambulatory Visit: Payer: Medicare HMO

## 2019-02-21 ENCOUNTER — Other Ambulatory Visit: Payer: Self-pay

## 2019-02-21 ENCOUNTER — Ambulatory Visit
Admission: RE | Admit: 2019-02-21 | Discharge: 2019-02-21 | Disposition: A | Discharge status: Home / Self Care | Payer: Medicare HMO | Source: Ambulatory Visit | Attending: Radiation Oncology | Admitting: Radiation Oncology

## 2019-02-21 DIAGNOSIS — Z510 Encounter for antineoplastic radiation therapy: Secondary | ICD-10-CM | POA: Diagnosis not present

## 2019-02-22 ENCOUNTER — Other Ambulatory Visit: Payer: Self-pay

## 2019-02-22 ENCOUNTER — Ambulatory Visit
Admission: RE | Admit: 2019-02-22 | Discharge: 2019-02-22 | Disposition: A | Discharge status: Home / Self Care | Payer: Medicare HMO | Source: Ambulatory Visit | Attending: Radiation Oncology | Admitting: Radiation Oncology

## 2019-02-22 ENCOUNTER — Other Ambulatory Visit: Payer: Self-pay | Admitting: Gerontology

## 2019-02-22 DIAGNOSIS — R221 Localized swelling, mass and lump, neck: Secondary | ICD-10-CM

## 2019-02-22 DIAGNOSIS — Z510 Encounter for antineoplastic radiation therapy: Secondary | ICD-10-CM | POA: Diagnosis not present

## 2019-02-23 ENCOUNTER — Other Ambulatory Visit: Payer: Self-pay | Admitting: Gerontology

## 2019-02-23 ENCOUNTER — Ambulatory Visit
Admission: RE | Admit: 2019-02-23 | Discharge: 2019-02-23 | Disposition: A | Discharge status: Home / Self Care | Payer: Medicare HMO | Source: Ambulatory Visit | Attending: Radiation Oncology | Admitting: Radiation Oncology

## 2019-02-23 ENCOUNTER — Other Ambulatory Visit: Payer: Self-pay

## 2019-02-23 DIAGNOSIS — R221 Localized swelling, mass and lump, neck: Secondary | ICD-10-CM

## 2019-02-23 DIAGNOSIS — I779 Disorder of arteries and arterioles, unspecified: Secondary | ICD-10-CM

## 2019-02-23 DIAGNOSIS — Z510 Encounter for antineoplastic radiation therapy: Secondary | ICD-10-CM | POA: Diagnosis not present

## 2019-02-24 ENCOUNTER — Ambulatory Visit
Admission: RE | Admit: 2019-02-24 | Discharge: 2019-02-24 | Disposition: A | Discharge status: Home / Self Care | Payer: Medicare HMO | Source: Ambulatory Visit | Attending: Radiation Oncology | Admitting: Radiation Oncology

## 2019-02-24 ENCOUNTER — Other Ambulatory Visit: Payer: Self-pay

## 2019-02-24 DIAGNOSIS — C50212 Malignant neoplasm of upper-inner quadrant of left female breast: Secondary | ICD-10-CM | POA: Diagnosis not present

## 2019-02-24 DIAGNOSIS — Z510 Encounter for antineoplastic radiation therapy: Secondary | ICD-10-CM | POA: Diagnosis not present

## 2019-02-24 DIAGNOSIS — Z170 Estrogen receptor positive status [ER+]: Secondary | ICD-10-CM | POA: Diagnosis not present

## 2019-02-25 ENCOUNTER — Ambulatory Visit
Admission: RE | Admit: 2019-02-25 | Discharge: 2019-02-25 | Disposition: A | Discharge status: Home / Self Care | Payer: Medicare HMO | Source: Ambulatory Visit | Attending: Gerontology | Admitting: Gerontology

## 2019-02-25 ENCOUNTER — Ambulatory Visit: Payer: Medicare HMO

## 2019-02-25 ENCOUNTER — Other Ambulatory Visit: Payer: Self-pay

## 2019-02-25 ENCOUNTER — Ambulatory Visit
Admission: RE | Admit: 2019-02-25 | Discharge: 2019-02-25 | Disposition: A | Discharge status: Home / Self Care | Payer: Medicare HMO | Source: Ambulatory Visit | Attending: Radiation Oncology | Admitting: Radiation Oncology

## 2019-02-25 DIAGNOSIS — R221 Localized swelling, mass and lump, neck: Secondary | ICD-10-CM | POA: Insufficient documentation

## 2019-02-25 DIAGNOSIS — I779 Disorder of arteries and arterioles, unspecified: Secondary | ICD-10-CM | POA: Diagnosis present

## 2019-02-25 DIAGNOSIS — Z510 Encounter for antineoplastic radiation therapy: Secondary | ICD-10-CM | POA: Diagnosis not present

## 2019-02-28 ENCOUNTER — Inpatient Hospital Stay: Payer: Medicare HMO | Attending: Radiation Oncology

## 2019-02-28 ENCOUNTER — Other Ambulatory Visit: Payer: Self-pay

## 2019-02-28 ENCOUNTER — Ambulatory Visit
Admission: RE | Admit: 2019-02-28 | Discharge: 2019-02-28 | Disposition: A | Discharge status: Home / Self Care | Payer: Medicare HMO | Source: Ambulatory Visit | Attending: Radiation Oncology | Admitting: Radiation Oncology

## 2019-02-28 DIAGNOSIS — Z170 Estrogen receptor positive status [ER+]: Secondary | ICD-10-CM | POA: Insufficient documentation

## 2019-02-28 DIAGNOSIS — Z510 Encounter for antineoplastic radiation therapy: Secondary | ICD-10-CM | POA: Diagnosis not present

## 2019-02-28 DIAGNOSIS — C50212 Malignant neoplasm of upper-inner quadrant of left female breast: Secondary | ICD-10-CM | POA: Diagnosis not present

## 2019-02-28 DIAGNOSIS — Z79811 Long term (current) use of aromatase inhibitors: Secondary | ICD-10-CM | POA: Insufficient documentation

## 2019-02-28 LAB — CBC
HCT: 35.8 % — ABNORMAL LOW (ref 36.0–46.0)
Hemoglobin: 11.6 g/dL — ABNORMAL LOW (ref 12.0–15.0)
MCH: 27.6 pg (ref 26.0–34.0)
MCHC: 32.4 g/dL (ref 30.0–36.0)
MCV: 85.2 fL (ref 80.0–100.0)
Platelets: 193 10*3/uL (ref 150–400)
RBC: 4.2 MIL/uL (ref 3.87–5.11)
RDW: 15 % (ref 11.5–15.5)
WBC: 4.7 10*3/uL (ref 4.0–10.5)
nRBC: 0 % (ref 0.0–0.2)

## 2019-03-01 ENCOUNTER — Other Ambulatory Visit: Payer: Self-pay

## 2019-03-01 ENCOUNTER — Ambulatory Visit
Admission: RE | Admit: 2019-03-01 | Discharge: 2019-03-01 | Disposition: A | Discharge status: Home / Self Care | Payer: Medicare HMO | Source: Ambulatory Visit | Attending: Radiation Oncology | Admitting: Radiation Oncology

## 2019-03-01 DIAGNOSIS — Z510 Encounter for antineoplastic radiation therapy: Secondary | ICD-10-CM | POA: Diagnosis not present

## 2019-03-02 ENCOUNTER — Ambulatory Visit
Admission: RE | Admit: 2019-03-02 | Discharge: 2019-03-02 | Disposition: A | Discharge status: Home / Self Care | Payer: Medicare HMO | Source: Ambulatory Visit | Attending: Radiation Oncology | Admitting: Radiation Oncology

## 2019-03-02 ENCOUNTER — Other Ambulatory Visit: Payer: Self-pay

## 2019-03-02 DIAGNOSIS — Z510 Encounter for antineoplastic radiation therapy: Secondary | ICD-10-CM | POA: Diagnosis not present

## 2019-03-03 ENCOUNTER — Ambulatory Visit
Admission: RE | Admit: 2019-03-03 | Discharge: 2019-03-03 | Disposition: A | Discharge status: Home / Self Care | Payer: Medicare HMO | Source: Ambulatory Visit | Attending: Radiation Oncology | Admitting: Radiation Oncology

## 2019-03-03 ENCOUNTER — Other Ambulatory Visit: Payer: Self-pay

## 2019-03-03 DIAGNOSIS — Z510 Encounter for antineoplastic radiation therapy: Secondary | ICD-10-CM | POA: Diagnosis not present

## 2019-03-04 ENCOUNTER — Ambulatory Visit
Admission: RE | Admit: 2019-03-04 | Discharge: 2019-03-04 | Disposition: A | Discharge status: Home / Self Care | Payer: Medicare HMO | Source: Ambulatory Visit | Attending: Radiation Oncology | Admitting: Radiation Oncology

## 2019-03-04 ENCOUNTER — Other Ambulatory Visit: Payer: Self-pay

## 2019-03-04 DIAGNOSIS — Z510 Encounter for antineoplastic radiation therapy: Secondary | ICD-10-CM | POA: Diagnosis not present

## 2019-03-07 ENCOUNTER — Other Ambulatory Visit: Payer: Self-pay

## 2019-03-07 ENCOUNTER — Ambulatory Visit
Admission: RE | Admit: 2019-03-07 | Discharge: 2019-03-07 | Disposition: A | Discharge status: Home / Self Care | Payer: Medicare HMO | Source: Ambulatory Visit | Attending: Radiation Oncology | Admitting: Radiation Oncology

## 2019-03-07 DIAGNOSIS — Z510 Encounter for antineoplastic radiation therapy: Secondary | ICD-10-CM | POA: Diagnosis not present

## 2019-03-08 ENCOUNTER — Other Ambulatory Visit: Payer: Self-pay

## 2019-03-08 ENCOUNTER — Ambulatory Visit
Admission: RE | Admit: 2019-03-08 | Discharge: 2019-03-08 | Disposition: A | Discharge status: Home / Self Care | Payer: Medicare HMO | Source: Ambulatory Visit | Attending: Radiation Oncology | Admitting: Radiation Oncology

## 2019-03-08 DIAGNOSIS — Z510 Encounter for antineoplastic radiation therapy: Secondary | ICD-10-CM | POA: Diagnosis not present

## 2019-03-09 ENCOUNTER — Ambulatory Visit
Admission: RE | Admit: 2019-03-09 | Discharge: 2019-03-09 | Disposition: A | Discharge status: Home / Self Care | Payer: Medicare HMO | Source: Ambulatory Visit | Attending: Radiation Oncology | Admitting: Radiation Oncology

## 2019-03-09 ENCOUNTER — Other Ambulatory Visit: Payer: Self-pay

## 2019-03-09 DIAGNOSIS — Z510 Encounter for antineoplastic radiation therapy: Secondary | ICD-10-CM | POA: Diagnosis not present

## 2019-03-10 ENCOUNTER — Ambulatory Visit
Admission: RE | Admit: 2019-03-10 | Discharge: 2019-03-10 | Disposition: A | Discharge status: Home / Self Care | Payer: Medicare HMO | Source: Ambulatory Visit | Attending: Radiation Oncology | Admitting: Radiation Oncology

## 2019-03-10 ENCOUNTER — Other Ambulatory Visit: Payer: Self-pay

## 2019-03-10 DIAGNOSIS — Z510 Encounter for antineoplastic radiation therapy: Secondary | ICD-10-CM | POA: Diagnosis not present

## 2019-03-11 ENCOUNTER — Other Ambulatory Visit: Payer: Self-pay

## 2019-03-11 ENCOUNTER — Ambulatory Visit
Admission: RE | Admit: 2019-03-11 | Discharge: 2019-03-11 | Disposition: A | Discharge status: Home / Self Care | Payer: Medicare HMO | Source: Ambulatory Visit | Attending: Radiation Oncology | Admitting: Radiation Oncology

## 2019-03-11 DIAGNOSIS — Z510 Encounter for antineoplastic radiation therapy: Secondary | ICD-10-CM | POA: Diagnosis not present

## 2019-03-14 ENCOUNTER — Ambulatory Visit
Admission: RE | Admit: 2019-03-14 | Discharge: 2019-03-14 | Disposition: A | Discharge status: Home / Self Care | Payer: Medicare HMO | Source: Ambulatory Visit | Attending: Radiation Oncology | Admitting: Radiation Oncology

## 2019-03-14 ENCOUNTER — Other Ambulatory Visit: Payer: Self-pay

## 2019-03-14 ENCOUNTER — Inpatient Hospital Stay: Payer: Medicare HMO

## 2019-03-14 DIAGNOSIS — C50212 Malignant neoplasm of upper-inner quadrant of left female breast: Secondary | ICD-10-CM

## 2019-03-14 DIAGNOSIS — Z170 Estrogen receptor positive status [ER+]: Secondary | ICD-10-CM

## 2019-03-14 DIAGNOSIS — Z510 Encounter for antineoplastic radiation therapy: Secondary | ICD-10-CM | POA: Diagnosis not present

## 2019-03-14 LAB — COMPREHENSIVE METABOLIC PANEL
ALT: 25 U/L (ref 0–44)
AST: 30 U/L (ref 15–41)
Albumin: 3.6 g/dL (ref 3.5–5.0)
Alkaline Phosphatase: 78 U/L (ref 38–126)
Anion gap: 8 (ref 5–15)
BUN: 18 mg/dL (ref 8–23)
CO2: 27 mmol/L (ref 22–32)
Calcium: 9.3 mg/dL (ref 8.9–10.3)
Chloride: 105 mmol/L (ref 98–111)
Creatinine, Ser: 0.92 mg/dL (ref 0.44–1.00)
GFR calc Af Amer: >60 mL/min (ref >60)
GFR calc non Af Amer: >60 mL/min (ref >60)
Glucose, Bld: 182 mg/dL — ABNORMAL HIGH (ref 70–99)
Potassium: 4.1 mmol/L (ref 3.5–5.1)
Sodium: 140 mmol/L (ref 135–145)
Total Bilirubin: 0.4 mg/dL (ref 0.3–1.2)
Total Protein: 7.6 g/dL (ref 6.5–8.1)

## 2019-03-14 LAB — CBC WITH DIFFERENTIAL/PLATELET
Abs Immature Granulocytes: 0.01 10*3/uL (ref 0.00–0.07)
Basophils Absolute: 0 10*3/uL (ref 0.0–0.1)
Basophils Relative: 1 %
Eosinophils Absolute: 0.1 10*3/uL (ref 0.0–0.5)
Eosinophils Relative: 2 %
HCT: 35.6 % — ABNORMAL LOW (ref 36.0–46.0)
Hemoglobin: 11.4 g/dL — ABNORMAL LOW (ref 12.0–15.0)
Immature Granulocytes: 0 %
Lymphocytes Relative: 24 %
Lymphs Abs: 1 10*3/uL (ref 0.7–4.0)
MCH: 27.2 pg (ref 26.0–34.0)
MCHC: 32 g/dL (ref 30.0–36.0)
MCV: 85 fL (ref 80.0–100.0)
Monocytes Absolute: 0.4 10*3/uL (ref 0.1–1.0)
Monocytes Relative: 10 %
Neutro Abs: 2.7 10*3/uL (ref 1.7–7.7)
Neutrophils Relative %: 63 %
Platelets: 179 10*3/uL (ref 150–400)
RBC: 4.19 MIL/uL (ref 3.87–5.11)
RDW: 15 % (ref 11.5–15.5)
WBC: 4.3 10*3/uL (ref 4.0–10.5)
nRBC: 0 % (ref 0.0–0.2)

## 2019-03-15 ENCOUNTER — Ambulatory Visit
Admission: RE | Admit: 2019-03-15 | Discharge: 2019-03-15 | Disposition: A | Discharge status: Home / Self Care | Payer: Medicare HMO | Source: Ambulatory Visit | Attending: Radiation Oncology | Admitting: Radiation Oncology

## 2019-03-15 ENCOUNTER — Other Ambulatory Visit: Payer: Self-pay | Admitting: *Deleted

## 2019-03-15 ENCOUNTER — Other Ambulatory Visit: Payer: Self-pay

## 2019-03-15 DIAGNOSIS — Z510 Encounter for antineoplastic radiation therapy: Secondary | ICD-10-CM | POA: Diagnosis not present

## 2019-03-15 LAB — CANCER ANTIGEN 27.29: CA 27.29: 15.2 U/mL (ref 0.0–38.6)

## 2019-03-15 MED ORDER — SILVER SULFADIAZINE 1 % EX CREA: 1.0000 "application " | TOPICAL_CREAM | Freq: Two times a day (BID) | CUTANEOUS | 2 refills | Status: DC

## 2019-03-16 ENCOUNTER — Other Ambulatory Visit: Payer: Self-pay

## 2019-03-16 ENCOUNTER — Ambulatory Visit
Admission: RE | Admit: 2019-03-16 | Discharge: 2019-03-16 | Disposition: A | Discharge status: Home / Self Care | Payer: Medicare HMO | Source: Ambulatory Visit | Attending: Radiation Oncology | Admitting: Radiation Oncology

## 2019-03-16 DIAGNOSIS — Z510 Encounter for antineoplastic radiation therapy: Secondary | ICD-10-CM | POA: Diagnosis not present

## 2019-03-17 ENCOUNTER — Ambulatory Visit
Admission: RE | Admit: 2019-03-17 | Discharge: 2019-03-17 | Disposition: A | Discharge status: Home / Self Care | Payer: Medicare HMO | Source: Ambulatory Visit | Attending: Radiation Oncology | Admitting: Radiation Oncology

## 2019-03-17 ENCOUNTER — Other Ambulatory Visit: Payer: Self-pay

## 2019-03-17 DIAGNOSIS — Z510 Encounter for antineoplastic radiation therapy: Secondary | ICD-10-CM | POA: Diagnosis not present

## 2019-03-18 ENCOUNTER — Other Ambulatory Visit: Payer: Self-pay

## 2019-03-18 ENCOUNTER — Ambulatory Visit
Admission: RE | Admit: 2019-03-18 | Discharge: 2019-03-18 | Disposition: A | Discharge status: Home / Self Care | Payer: Medicare HMO | Source: Ambulatory Visit | Attending: Radiation Oncology | Admitting: Radiation Oncology

## 2019-03-18 DIAGNOSIS — Z510 Encounter for antineoplastic radiation therapy: Secondary | ICD-10-CM | POA: Diagnosis not present

## 2019-03-21 ENCOUNTER — Ambulatory Visit
Admission: RE | Admit: 2019-03-21 | Discharge: 2019-03-21 | Disposition: A | Discharge status: Home / Self Care | Payer: Medicare HMO | Source: Ambulatory Visit | Attending: Radiation Oncology | Admitting: Radiation Oncology

## 2019-03-21 ENCOUNTER — Other Ambulatory Visit: Payer: Self-pay

## 2019-03-21 DIAGNOSIS — Z510 Encounter for antineoplastic radiation therapy: Secondary | ICD-10-CM | POA: Diagnosis not present

## 2019-03-30 ENCOUNTER — Other Ambulatory Visit: Payer: Self-pay

## 2019-03-30 DIAGNOSIS — C50212 Malignant neoplasm of upper-inner quadrant of left female breast: Secondary | ICD-10-CM

## 2019-03-30 DIAGNOSIS — Z170 Estrogen receptor positive status [ER+]: Secondary | ICD-10-CM

## 2019-03-30 NOTE — Progress Notes (Signed)
[facility]  [address] [place], [place] [address] Phone: [phone]  Fax: [phone]   Clinic Day:  03/31/2019  Referring physician: Ezequiel Kayser, MD  Chief Complaint: Caitlin Ford is a 68 y.o. female with stage I left breast cancer who is seen for assessment after interval radiation and initiation of Femara.   HPI: The patient was last seen in the medical oncology clinic on 02/04/2019. At that time, she was doing well s/p lumpectomy.  Exam revealed mild post surgical changes. As her tumor >5 mm, we discussed consideration of Oncotype DX testing. Patient was not interested in testing or chemotherapy.   She underwent breast radiation from 02/16/2019 - 03/21/2019.   Bilateral carotid duplex ultrasound on 02/25/2019 revealed a minor carotid atherosclerosis. There was no hemodynamically significant ICA stenosis. The degree of narrowing less than 50% bilaterally by ultrasound criteria. There was patent antegrade vertebral flow bilaterally.   Thyroid ultrasound on 02/25/2019 revealed a remote total thyroidectomy. No significant finding by ultrasound.   Labs on 03/14/2019: Hematocrit 35.6, hemoglobin 11.4, platelets 179,000, WBC 4,300.   LFTs were normal.  CA 27.29 was 15.2.   During the interim, she has felt good. The patient states that radiation was "ok".  She notes skin irritation and peeling. She is not examining her breast monthly. Patient was concerned about her right carotid artery that was pulsating. She denies any joint or bone pain.    Past Medical History:  Diagnosis Date  . Arthritis   . Breast cancer ([place])   . Diabetes mellitus without complication ([place])   . GERD (gastroesophageal reflux disease)    occ no meds  . Goiter   . Headache    simus  . Hypercholesteremia   . Hypertension   . Hypothyroidism   . Malignant neoplasm of upper-inner quadrant of left breast in female, estrogen receptor positive ([place]) 02/04/2019  .  Proteinuria   . Wears dentures    partial upper, full lower    Past Surgical History:  Procedure Laterality Date  . ABDOMINAL HYSTERECTOMY    . BREAST BIOPSY Left 12/14/2018   [facility]  . BREAST LUMPECTOMY Left 01/14/2019  . COLONOSCOPY WITH PROPOFOL N/A 02/09/2015   Procedure: COLONOSCOPY WITH PROPOFOL;  Surgeon: Hulen Luster, MD;  Location: [facility] ENDOSCOPY;  Service: Gastroenterology;  Laterality: N/A;  . PARTIAL MASTECTOMY WITH NEEDLE LOCALIZATION AND AXILLARY SENTINEL LYMPH NODE BX Left 01/14/2019   Procedure: PARTIAL MASTECTOMY WITH NEEDLE LOCALIZATION AND AXILLARY SENTINEL LYMPH NODE BX;  Surgeon: Benjamine Sprague, DO;  Location: [facility] ORS;  Service: General;  Laterality: Left;  . THYROIDECTOMY      Family History  Problem Relation Age of Onset  . Diabetes Mother   . Diabetes Maternal Aunt   . Diabetes Maternal Uncle   . Breast cancer Neg Hx     Social History:  reports that she has never smoked. She has never used smokeless tobacco. She reports that she does not drink alcohol or use drugs. She does not smoke. She drinks wine occasionally. She denies any known exposure to radiation or toxins. 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 She has two children. She lives in [place]. The patient is alone today.  Allergies:  Allergies  Allergen Reactions  . Altace [Ramipril] Other (See Comments)    Angioedema  . Avandia [Rosiglitazone] Other (See Comments)     Muscle Aches  . Crestor [Rosuvastatin] Other (See Comments)    Muscle  ache  . Doxycycline Other (See Comments)    Muscle Aches  . Lipitor [Atorvastatin] Other (See Comments)    Muscle ache    Current Medications: Current Outpatient Medications  Medication Sig Dispense Refill  . aspirin EC 81 MG tablet Take 81 mg by mouth daily.     [name] glimepiride (AMARYL) 2 MG tablet Take 2 mg by mouth daily with breakfast. Takes with the 4 mg glimepiride    . glimepiride (AMARYL) 4 MG tablet Take 4 mg by  mouth daily with breakfast. Combines this with the 2 mg glimepiride    . levothyroxine (SYNTHROID) 125 MCG tablet Take 125 mcg by mouth daily before breakfast.     . losartan (COZAAR) 100 MG tablet Take 100 mg by mouth every morning.     . metFORMIN (GLUCOPHAGE-XR) 500 MG 24 hr tablet Take 1,000 mg by mouth 2 (two) times daily.     . metoprolol succinate (TOPROL-XL) 25 MG 24 hr tablet Take 25 mg by mouth daily. Unsure of dose/ just started 8/20    . Multiple Vitamin (MULTI-VITAMIN) tablet Take 1 tablet by mouth daily.     . simvastatin (ZOCOR) 40 MG tablet Take 40 mg by mouth daily at 6 PM.     . cetirizine (ZYRTEC) 10 MG tablet Take 10 mg by mouth as needed.     . fluticasone (FLONASE) 50 MCG/ACT nasal spray Place 1 spray into both nostrils as needed.     [name] ibuprofen (ADVIL) 800 MG tablet Take 1 tablet (800 mg total) by mouth every 8 (eight) hours as needed for mild pain or moderate pain. (Patient not taking: Reported on 02/04/2019) 30 tablet 0  . silver sulfADIAZINE (SILVADENE) 1 % cream Apply 1 application topically 2 (two) times daily. (Patient not taking: Reported on 03/31/2019) 50 g 2   No current facility-administered medications for this visit.     Review of Systems  Constitutional: Positive for weight loss (1 pound). Negative for chills, diaphoresis, fever and malaise/fatigue.       Feels "good".  HENT: Negative.  Negative for congestion, ear pain, hearing loss, nosebleeds, sinus pain and sore throat.   Eyes: Negative.  Negative for blurred vision, double vision and photophobia.  Respiratory: Negative.  Negative for cough, sputum production, shortness of breath and wheezing.   Cardiovascular: Negative.  Negative for chest pain, palpitations, orthopnea, leg swelling and PND.  Gastrointestinal: Negative.  Negative for abdominal pain, blood in stool, constipation, diarrhea, melena, nausea and vomiting.  Genitourinary: Negative.  Negative for dysuria, frequency, hematuria and urgency.   Musculoskeletal: Negative.  Negative for back pain, joint pain and myalgias.  Skin: Negative.  Negative for rash.       Peeling and irritation after radiation.  Neurological: Negative.  Negative for dizziness, tingling, sensory change, speech change, focal weakness, weakness and headaches.  Endo/Heme/Allergies: Negative.  Does not bruise/bleed easily.  Psychiatric/Behavioral: Negative.  Negative for depression and memory loss. The patient is not nervous/anxious and does not have insomnia.   All other systems reviewed and are negative.  Performance status (ECOG): 0  Vitals Blood pressure (!) 143/66, pulse 71, temperature 97.8 F (36.6 C), temperature source Tympanic, resp. rate 18, height 5' 7" (1.702 m), weight 192 lb 12.7 oz (87.5 kg), SpO2 100 %.  Physical Exam  Constitutional: She is oriented to person, place, and time. She appears well-developed and well-nourished. No distress.  HENT:  Head: Normocephalic and atraumatic.  Mouth/Throat: Oropharynx is clear and moist. No oropharyngeal exudate.  Short dark hair. Mask.   Eyes: Pupils are equal, round, and reactive to light. Conjunctivae and EOM are normal. No scleral icterus.  Brown eyes.  Neck: Normal range of motion. Neck supple. No JVD present.  Cardiovascular: Normal rate, regular rhythm and normal heart sounds.  No murmur heard. Pulmonary/Chest: Effort normal and breath sounds normal. No respiratory distress. She has no wheezes. She has no rales. Right breast exhibits no inverted nipple, no mass, no nipple discharge, no skin change and no tenderness. Left breast exhibits skin change (mild moist desquamation in rectangular shape s/p XRT in upper quadrant; dry axillary desquamation) and tenderness (mild). Left breast exhibits no inverted nipple, no mass and no nipple discharge. Breasts are symmetrical (right lateral post-opeative changes).  Abdominal: Soft. Bowel sounds are normal. She exhibits no distension and no mass. There is no  abdominal tenderness. There is no rebound and no guarding.  Musculoskeletal: Normal range of motion.        General: No edema.  Lymphadenopathy:       Head (right side): No preauricular, no posterior auricular and no occipital adenopathy present.       Head (left side): No preauricular, no posterior auricular and no occipital adenopathy present.    She has no cervical adenopathy.    She has no axillary adenopathy.       Right: No inguinal and no supraclavicular adenopathy present.       Left: No inguinal and no supraclavicular adenopathy present.  Neurological: She is alert and oriented to person, place, and time.  Skin: Skin is warm and dry. No rash noted. She is not diaphoretic. No erythema. No pallor.  Psychiatric: She has a normal mood and affect. Her behavior is normal. Judgment and thought content normal.  Nursing note and vitals reviewed.   Appointment on 03/31/2019  Component Date Value Ref Range Status  . WBC 03/31/2019 4.6  4.0 - 10.5 K/uL Final  . RBC 03/31/2019 4.11  3.87 - 5.11 MIL/uL Final  . Hemoglobin 03/31/2019 11.3* 12.0 - 15.0 g/dL Final  . HCT 03/31/2019 35.3* 36.0 - 46.0 % Final  . MCV 03/31/2019 85.9  80.0 - 100.0 fL Final  . MCH 03/31/2019 27.5  26.0 - 34.0 pg Final  . MCHC 03/31/2019 32.0  30.0 - 36.0 g/dL Final  . RDW 03/31/2019 15.2  11.5 - 15.5 % Final  . Platelets 03/31/2019 191  150 - 400 K/uL Final  . nRBC 03/31/2019 0.0  0.0 - 0.2 % Final  . Neutrophils Relative % 03/31/2019 66  % Final  . Neutro Abs 03/31/2019 3.0  1.7 - 7.7 K/uL Final  . Lymphocytes Relative 03/31/2019 23  % Final  . Lymphs Abs 03/31/2019 1.1  0.7 - 4.0 K/uL Final  . Monocytes Relative 03/31/2019 9  % Final  . Monocytes Absolute 03/31/2019 0.4  0.1 - 1.0 K/uL Final  . Eosinophils Relative 03/31/2019 2  % Final  . Eosinophils Absolute 03/31/2019 0.1  0.0 - 0.5 K/uL Final  . Basophils Relative 03/31/2019 0  % Final  . Basophils Absolute 03/31/2019 0.0  0.0 - 0.1 K/uL Final  .  Immature Granulocytes 03/31/2019 0  % Final  . Abs Immature Granulocytes 03/31/2019 0.01  0.00 - 0.07 K/uL Final   Performed at [facility], [address], [place], [place] [address]    Assessment:  Dawnn Nam is a 68 y.o. female withstage IAleft breast cancers/p lumpectomy and axillary lymph node biopsy on 01/14/2019.  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  Two sentinel lymph nodes negative for malignancy.  Tumor was ER+>90%, PR +>90%, andHER-2/neu negative (1+).  Pathologic stage was pT1b pN0.   Bilateral screening mammogramon 11/25/2018 revealed a possible distortion in the left breast. Bilateral diagnostic mammogramon 12/02/2018 revealed a persistent left breast distortion in the upper inner quadrant, middle depth.   She underwent breast radiation from 02/16/2019 - 03/21/2019.   She experienced mild desquamation.  CA27.29 has been followed: 15 on 12/24/2018 and 15.2 on 03/14/2019.  Bone densityon 10/21/2018 was normal with a T-score of +0.3 in the left femoral neck and + 1.3 in AP spine L1-L4.  Symptomatically, she is doing well.  Exam reveals mild desquamation s/p radiation.  Plan: 1.   Labs today: CBC with diff, CMP, CA 27.29. 2.   Clinical stage I left breast cancer She is s/p lumpectomy and SLN biopsy (01/14/2019).  She is s/p breast radiation (completed 03/21/2019).             Review plan for 5 years of adjuvant endocrine therapy.  Re-review potential side effects associated with tamoxifen versus aromatase inhibitors.  Discuss plan for initiation of Femara.   Information provided.  Patient consented.   Rx:  Femara 2.5 mg a day (dis: #30 with 2 refills). 3.   RTC in 1 month for MD assessment and labs (LFTs).  I discussed the assessment and treatment plan with the patient.  The patient was provided an opportunity to ask questions and all were answered.  The patient agreed  with the plan and demonstrated an understanding of the instructions.  The patient was advised to call back if the symptoms worsen or if the condition fails to improve as anticipated.   Lequita Asal, MD, PhD    03/31/2019, 10:13 AM  I, Selena Batten, am acting as scribe for [employer]. Mike Gip, MD, PhD.  I, Melissa C. Mike Gip, MD, have reviewed the above documentation for accuracy and completeness, and I agree with the above.

## 2019-03-31 ENCOUNTER — Encounter: Payer: Self-pay | Admitting: Hematology and Oncology

## 2019-03-31 ENCOUNTER — Other Ambulatory Visit: Payer: Self-pay

## 2019-03-31 ENCOUNTER — Inpatient Hospital Stay: Payer: Medicare HMO

## 2019-03-31 ENCOUNTER — Inpatient Hospital Stay: Payer: Medicare HMO | Attending: Hematology and Oncology | Admitting: Hematology and Oncology

## 2019-03-31 VITALS — BP 143/66 | HR 71 | Temp 97.8°F | Resp 18 | Ht 67.0 in | Wt 192.8 lb

## 2019-03-31 DIAGNOSIS — C50912 Malignant neoplasm of unspecified site of left female breast: Secondary | ICD-10-CM | POA: Insufficient documentation

## 2019-03-31 DIAGNOSIS — M1990 Unspecified osteoarthritis, unspecified site: Secondary | ICD-10-CM | POA: Diagnosis not present

## 2019-03-31 DIAGNOSIS — Z791 Long term (current) use of non-steroidal anti-inflammatories (NSAID): Secondary | ICD-10-CM | POA: Insufficient documentation

## 2019-03-31 DIAGNOSIS — E119 Type 2 diabetes mellitus without complications: Secondary | ICD-10-CM | POA: Insufficient documentation

## 2019-03-31 DIAGNOSIS — Z79811 Long term (current) use of aromatase inhibitors: Secondary | ICD-10-CM | POA: Insufficient documentation

## 2019-03-31 DIAGNOSIS — R634 Abnormal weight loss: Secondary | ICD-10-CM | POA: Diagnosis not present

## 2019-03-31 DIAGNOSIS — Z7984 Long term (current) use of oral hypoglycemic drugs: Secondary | ICD-10-CM | POA: Insufficient documentation

## 2019-03-31 DIAGNOSIS — C50212 Malignant neoplasm of upper-inner quadrant of left female breast: Secondary | ICD-10-CM | POA: Diagnosis not present

## 2019-03-31 DIAGNOSIS — I10 Essential (primary) hypertension: Secondary | ICD-10-CM | POA: Insufficient documentation

## 2019-03-31 DIAGNOSIS — K219 Gastro-esophageal reflux disease without esophagitis: Secondary | ICD-10-CM | POA: Diagnosis not present

## 2019-03-31 DIAGNOSIS — Z923 Personal history of irradiation: Secondary | ICD-10-CM | POA: Insufficient documentation

## 2019-03-31 DIAGNOSIS — E039 Hypothyroidism, unspecified: Secondary | ICD-10-CM | POA: Diagnosis not present

## 2019-03-31 DIAGNOSIS — Z79899 Other long term (current) drug therapy: Secondary | ICD-10-CM | POA: Diagnosis not present

## 2019-03-31 DIAGNOSIS — Z170 Estrogen receptor positive status [ER+]: Secondary | ICD-10-CM

## 2019-03-31 DIAGNOSIS — E7800 Pure hypercholesterolemia, unspecified: Secondary | ICD-10-CM | POA: Diagnosis not present

## 2019-03-31 LAB — COMPREHENSIVE METABOLIC PANEL
ALT: 27 U/L (ref 0–44)
AST: 31 U/L (ref 15–41)
Albumin: 3.5 g/dL (ref 3.5–5.0)
Alkaline Phosphatase: 86 U/L (ref 38–126)
Anion gap: 10 (ref 5–15)
BUN: 17 mg/dL (ref 8–23)
CO2: 23 mmol/L (ref 22–32)
Calcium: 9.1 mg/dL (ref 8.9–10.3)
Chloride: 103 mmol/L (ref 98–111)
Creatinine, Ser: 0.86 mg/dL (ref 0.44–1.00)
GFR calc Af Amer: >60 mL/min (ref >60)
GFR calc non Af Amer: >60 mL/min (ref >60)
Glucose, Bld: 266 mg/dL — ABNORMAL HIGH (ref 70–99)
Potassium: 4.3 mmol/L (ref 3.5–5.1)
Sodium: 136 mmol/L (ref 135–145)
Total Bilirubin: 0.5 mg/dL (ref 0.3–1.2)
Total Protein: 7.8 g/dL (ref 6.5–8.1)

## 2019-03-31 LAB — CBC WITH DIFFERENTIAL/PLATELET
Abs Immature Granulocytes: 0.01 10*3/uL (ref 0.00–0.07)
Basophils Absolute: 0 10*3/uL (ref 0.0–0.1)
Basophils Relative: 0 %
Eosinophils Absolute: 0.1 10*3/uL (ref 0.0–0.5)
Eosinophils Relative: 2 %
HCT: 35.3 % — ABNORMAL LOW (ref 36.0–46.0)
Hemoglobin: 11.3 g/dL — ABNORMAL LOW (ref 12.0–15.0)
Immature Granulocytes: 0 %
Lymphocytes Relative: 23 %
Lymphs Abs: 1.1 10*3/uL (ref 0.7–4.0)
MCH: 27.5 pg (ref 26.0–34.0)
MCHC: 32 g/dL (ref 30.0–36.0)
MCV: 85.9 fL (ref 80.0–100.0)
Monocytes Absolute: 0.4 10*3/uL (ref 0.1–1.0)
Monocytes Relative: 9 %
Neutro Abs: 3 10*3/uL (ref 1.7–7.7)
Neutrophils Relative %: 66 %
Platelets: 191 10*3/uL (ref 150–400)
RBC: 4.11 MIL/uL (ref 3.87–5.11)
RDW: 15.2 % (ref 11.5–15.5)
WBC: 4.6 10*3/uL (ref 4.0–10.5)
nRBC: 0 % (ref 0.0–0.2)

## 2019-03-31 MED ORDER — LETROZOLE 2.5 MG PO TABS: 2.5000 mg | ORAL_TABLET | Freq: Every day | ORAL | 2 refills | Status: DC

## 2019-03-31 NOTE — Progress Notes (Signed)
New changes noted today 

## 2019-03-31 NOTE — Patient Instructions (Signed)
Letrozole tablets What is this medicine? LETROZOLE (LET roe zole) blocks the production of estrogen. It is used to treat breast cancer. This medicine may be used for other purposes; ask your health care provider or pharmacist if you have questions. COMMON BRAND NAME(S): Femara What should I tell my health care provider before I take this medicine? They need to know if you have any of these conditions:  high cholesterol  liver disease  osteoporosis (weak bones)  an unusual or allergic reaction to letrozole, other medicines, foods, dyes, or preservatives  pregnant or trying to get pregnant  breast-feeding How should I use this medicine? Take this medicine by mouth with a glass of water. You may take it with or without food. Follow the directions on the prescription label. Take your medicine at regular intervals. Do not take your medicine more often than directed. Do not stop taking except on your doctor's advice. Talk to your pediatrician regarding the use of this medicine in children. Special care may be needed. Overdosage: If you think you have taken too much of this medicine contact a poison control center or emergency room at once. NOTE: This medicine is only for you. Do not share this medicine with others. What if I miss a dose? If you miss a dose, take it as soon as you can. If it is almost time for your next dose, take only that dose. Do not take double or extra doses. What may interact with this medicine? Do not take this medicine with any of the following medications:  estrogens, like hormone replacement therapy or birth control pills This medicine may also interact with the following medications:  dietary supplements such as androstenedione or DHEA  prasterone  tamoxifen This list may not describe all possible interactions. Give your health care provider a list of all the medicines, herbs, non-prescription drugs, or dietary supplements you use. Also tell them if you  smoke, drink alcohol, or use illegal drugs. Some items may interact with your medicine. What should I watch for while using this medicine? Tell your doctor or healthcare professional if your symptoms do not start to get better or if they get worse. Do not become pregnant while taking this medicine or for 3 weeks after stopping it. Women should inform their doctor if they wish to become pregnant or think they might be pregnant. There is a potential for serious side effects to an unborn child. Talk to your health care professional or pharmacist for more information. Do not breast-feed while taking this medicine or for 3 weeks after stopping it. This medicine may interfere with the ability to have a child. Talk with your doctor or health care professional if you are concerned about your fertility. Using this medicine for a long time may increase your risk of low bone mass. Talk to your doctor about bone health. You may get drowsy or dizzy. Do not drive, use machinery, or do anything that needs mental alertness until you know how this medicine affects you. Do not stand or sit up quickly, especially if you are an older patient. This reduces the risk of dizzy or fainting spells. You may need blood work done while you are taking this medicine. What side effects may I notice from receiving this medicine? Side effects that you should report to your doctor or health care professional as soon as possible:  allergic reactions like skin rash, itching, or hives  bone fracture  chest pain  signs and symptoms of a blood clot  such as breathing problems; changes in vision; chest pain; severe, sudden headache; pain, swelling, warmth in the leg; trouble speaking; sudden numbness or weakness of the face, arm or leg  vaginal bleeding Side effects that usually do not require medical attention (report to your doctor or health care professional if they continue or are bothersome):  bone, back, joint, or muscle  pain  dizziness  fatigue  fluid retention  headache  hot flashes, night sweats  nausea  weight gain This list may not describe all possible side effects. Call your doctor for medical advice about side effects. You may report side effects to FDA at 1-800-FDA-1088. Where should I keep my medicine? Keep out of the reach of children. Store between 15 and 30 degrees C (59 and 86 degrees F). Throw away any unused medicine after the expiration date. NOTE: This sheet is a summary. It may not cover all possible information. If you have questions about this medicine, talk to your doctor, pharmacist, or health care provider.  2020 Elsevier/Gold Standard (2015-12-17 11:10:41)  

## 2019-04-01 LAB — CANCER ANTIGEN 27.29: CA 27.29: 16.9 U/mL (ref 0.0–38.6)

## 2019-04-25 ENCOUNTER — Ambulatory Visit: Payer: Medicare HMO | Admitting: Radiation Oncology

## 2019-04-28 ENCOUNTER — Other Ambulatory Visit: Payer: Medicare HMO

## 2019-04-28 ENCOUNTER — Ambulatory Visit: Payer: Medicare HMO | Admitting: Hematology and Oncology

## 2019-05-02 ENCOUNTER — Ambulatory Visit: Payer: Medicare HMO | Admitting: Hematology and Oncology

## 2019-05-02 NOTE — Progress Notes (Signed)
[facility]  [address], [address] [place], [place] [address] Phone: [phone]  Fax: [phone]   Telemedicine Office Visit:  05/04/2019  Referring physician: Ezequiel Kayser, MD  I connected with Caitlin Ford on 05/04/19 at 9:30 AM by videoconferencing and verified that I was speaking with the correct person using 2 identifiers.  The patient was at home.  I discussed the limitations, risk, security and privacy concerns of performing an evaluation and management service by videoconferencing and the availability of in person appointments.  I also discussed with the patient that there may be a patient responsible charge related to this service.  The patient expressed understanding and agreed to proceed.   Chief Complaint: Caitlin Ford is a 68 y.o. female with stage I left breast cancer on Femara who is seen for 1 month assessment    HPI: The patient was last seen in the medical oncology clinic on 03/31/2019. At that time, she was doing well.  Exam revealed mild desquamation s/p radiation. Hematocrit 35.3, hemoglobin 11.3 CA27.29 was 16.9.  She began Femara on 03/31/2019.  Liver function tests on 05/03/2019 revealed: an AST 35, ALT 33, alkaline phosphatase 80, and bilirubin 0.5   During the interim, she has been doing well on Femara.  She notes more hot flashes at night.   Symptomatically, she is doing well. She denies any joint aches. Weight is stable. Skin irritation from radiation has resolved.    Past Medical History:  Diagnosis Date   Arthritis    Breast cancer ([place])    Diabetes mellitus without complication ([place])    GERD (gastroesophageal reflux disease)    occ no meds   Goiter    Headache    simus   Hypercholesteremia    Hypertension    Hypothyroidism    Malignant neoplasm of upper-inner quadrant of left breast in female, estrogen receptor positive ([place]) 02/04/2019   Proteinuria    Wears dentures    partial upper, full  lower    Past Surgical History:  Procedure Laterality Date   ABDOMINAL HYSTERECTOMY     BREAST BIOPSY Left 12/14/2018   [facility]   BREAST LUMPECTOMY Left 01/14/2019   COLONOSCOPY WITH PROPOFOL N/A 02/09/2015   Procedure: COLONOSCOPY WITH PROPOFOL;  Surgeon: Hulen Luster, MD;  Location: [facility] ENDOSCOPY;  Service: Gastroenterology;  Laterality: N/A;   PARTIAL MASTECTOMY WITH NEEDLE LOCALIZATION AND AXILLARY SENTINEL LYMPH NODE BX Left 01/14/2019   Procedure: PARTIAL MASTECTOMY WITH NEEDLE LOCALIZATION AND AXILLARY SENTINEL LYMPH NODE BX;  Surgeon: Benjamine Sprague, DO;  Location: [facility] ORS;  Service: General;  Laterality: Left;   THYROIDECTOMY      Family History  Problem Relation Age of Onset   Diabetes Mother    Diabetes Maternal Aunt    Diabetes Maternal Uncle    Breast cancer Neg Hx     Social History:  reports that she has never smoked. She has never used smokeless tobacco. She reports that she does not drink alcohol or use drugs. 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 She has two children. She lives in [place]. The patient is alone  today.  Participants in the patient's visit and their role in the encounter included the patient, Caitlin Ford, and [employer], CMA, today.  The intake visit was provided by Samul Dada and Vito Berger, CMA.    Allergies:  Allergies  Allergen Reactions   Altace [Ramipril] Other (See Comments)    Angioedema  Avandia [Rosiglitazone] Other (See Comments)     Muscle Aches   Crestor [Rosuvastatin] Other (See Comments)    Muscle ache   Doxycycline Other (See Comments)    Muscle Aches   Lipitor [Atorvastatin] Other (See Comments)    Muscle ache    Current Medications: Current Outpatient Medications  Medication Sig Dispense Refill   aspirin EC 81 MG tablet Take 81 mg by mouth daily.      cetirizine (ZYRTEC) 10 MG tablet Take 10 mg by mouth as needed.      fluticasone (FLONASE) 50  MCG/ACT nasal spray Place 1 spray into both nostrils as needed.      glimepiride (AMARYL) 4 MG tablet Take 4 mg by mouth daily with breakfast. Combines this with the 2 mg glimepiride     ibuprofen (ADVIL) 800 MG tablet Take 1 tablet (800 mg total) by mouth every 8 (eight) hours as needed for mild pain or moderate pain. 30 tablet 0   letrozole (FEMARA) 2.5 MG tablet Take 1 tablet (2.5 mg total) by mouth daily. 30 tablet 2   levothyroxine (SYNTHROID) 125 MCG tablet Take 125 mcg by mouth daily before breakfast.      losartan (COZAAR) 100 MG tablet Take 100 mg by mouth every morning.      metFORMIN (GLUCOPHAGE-XR) 500 MG 24 hr tablet Take 1,000 mg by mouth 2 (two) times daily.      metoprolol succinate (TOPROL-XL) 25 MG 24 hr tablet Take 25 mg by mouth daily. Unsure of dose/ just started 8/20     Multiple Vitamin (MULTI-VITAMIN) tablet Take 1 tablet by mouth daily.      simvastatin (ZOCOR) 40 MG tablet Take 40 mg by mouth daily at 6 PM.      No current facility-administered medications for this visit.     Review of Systems  Constitutional: Negative for chills, diaphoresis, fever, malaise/fatigue and weight loss (stable).       Feels "good".  HENT: Negative.  Negative for congestion, ear pain, hearing loss, nosebleeds, sinus pain and sore throat.   Eyes: Negative.  Negative for blurred vision, double vision and photophobia.  Respiratory: Negative.  Negative for cough, sputum production, shortness of breath and wheezing.   Cardiovascular: Negative.  Negative for chest pain, palpitations, orthopnea, leg swelling and PND.  Gastrointestinal: Negative.  Negative for abdominal pain, blood in stool, constipation, diarrhea, melena, nausea and vomiting.  Genitourinary: Negative.  Negative for dysuria, frequency, hematuria and urgency.  Musculoskeletal: Negative.  Negative for back pain, joint pain and myalgias.  Skin: Negative.  Negative for rash.       Irritation s/p radiation, resolved.  Little  hyperpigmentation.  Neurological: Negative.  Negative for dizziness, tingling, sensory change, speech change, focal weakness, weakness and headaches.  Endo/Heme/Allergies: Negative.  Does not bruise/bleed easily.       Mild hot flashes at night.  Psychiatric/Behavioral: Negative.  Negative for depression and memory loss. The patient is not nervous/anxious and does not have insomnia.   All other systems reviewed and are negative.  Performance status (ECOG): 0  Vitals There were no vitals taken for this visit.   Physical Exam  Constitutional: She is oriented to person, place, and time. She appears well-developed and well-nourished. No distress.  HENT:  Head: Normocephalic and atraumatic.  Short dark hair.  Eyes: Conjunctivae and EOM are normal. No scleral icterus.  Glasses.  Brown eyes.  Neurological: She is alert and oriented to person, place, and time.  Skin: She is not diaphoretic.  No pallor.  Psychiatric: She has a normal mood and affect. Her behavior is normal. Judgment and thought content normal.  Nursing note reviewed.   Appointment on 05/03/2019  Component Date Value Ref Range Status   Total Protein 05/03/2019 7.7  6.5 - 8.1 g/dL Final   Albumin 05/03/2019 3.8  3.5 - 5.0 g/dL Final   AST 05/03/2019 35  15 - 41 U/L Final   ALT 05/03/2019 33  0 - 44 U/L Final   Alkaline Phosphatase 05/03/2019 80  38 - 126 U/L Final   Total Bilirubin 05/03/2019 0.5  0.3 - 1.2 mg/dL Final   Bilirubin, Direct 05/03/2019 <0.1  0.0 - 0.2 mg/dL Final   Indirect Bilirubin 05/03/2019 NOT CALCULATED  0.3 - 0.9 mg/dL Final   Performed at [facility], [address] [place],  [address]    Assessment:  Caitlin Ford is a 68 y.o. female withstage IAleft breast cancers/plumpectomy and axillary lymph node biopsy on 01/14/2019. 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 Two sentinel lymph nodes negative for  malignancy.Tumor wasER+>90%, PR +>90%, andHER-2/neu negative (1+).Pathologicstagewas pT1b pN0.  Bilateral screening mammogramon 11/25/2018 revealed a possible distortion in the left breast. Bilateral diagnostic mammogramon 12/02/2018 revealed a persistent left breast distortion in the upper inner quadrant, middle depth.   She underwent breast radiation from 02/16/2019 - 03/21/2019.   She began Femara on 03/31/2019.  CA27.29 has been followed: 15 on 12/24/2018, 15.2 on 03/14/2019, and 16.9 on 03/31/2019.  Bone densityon 10/21/2018 was normal with a T-score of +0.3 in the left femoral neck and + 1.3 in AP spine L1-L4.  Symptomatically, she is doing well on Femara.  She notes mild hot flashes at night.  She describes mild hyperpigmentation s/p radiation.  Plan: 1.   Review labs from 05/03/2019. 2.   Clinical stage I left breast cancer She is s/p lumpectomy and SLN biopsy (01/14/2019).             She is s/p breast radiation (completed 03/21/2019). She began Femara on 03/31/2019.              She is tolerating well. 3.   RTC in 3 months for MD assessment and labs (CBC with diff, CMP, CA27.29).  I discussed the assessment and treatment plan with the patient.  The patient was provided an opportunity to ask questions and all were answered.  The patient agreed with the plan and demonstrated an understanding of the instructions.  The patient was advised to call back if the symptoms worsen or if the condition fails to improve as anticipated.   Lequita Asal, MD, PhD    05/04/2019, 9:36 AM  I, Samul Dada, am acting as a scribe for Lequita Asal, MD.  I, [place] Mike Gip, MD, have reviewed the above documentation for accuracy and completeness, and I agree with the above.

## 2019-05-03 ENCOUNTER — Other Ambulatory Visit: Payer: Self-pay

## 2019-05-03 ENCOUNTER — Encounter: Payer: Self-pay | Admitting: Hematology and Oncology

## 2019-05-03 ENCOUNTER — Inpatient Hospital Stay: Payer: Medicare HMO | Attending: Hematology and Oncology

## 2019-05-03 DIAGNOSIS — M1990 Unspecified osteoarthritis, unspecified site: Secondary | ICD-10-CM | POA: Diagnosis not present

## 2019-05-03 DIAGNOSIS — Z923 Personal history of irradiation: Secondary | ICD-10-CM | POA: Insufficient documentation

## 2019-05-03 DIAGNOSIS — Z791 Long term (current) use of non-steroidal anti-inflammatories (NSAID): Secondary | ICD-10-CM | POA: Diagnosis not present

## 2019-05-03 DIAGNOSIS — Z170 Estrogen receptor positive status [ER+]: Secondary | ICD-10-CM | POA: Diagnosis not present

## 2019-05-03 DIAGNOSIS — C50212 Malignant neoplasm of upper-inner quadrant of left female breast: Secondary | ICD-10-CM | POA: Diagnosis present

## 2019-05-03 DIAGNOSIS — Z79899 Other long term (current) drug therapy: Secondary | ICD-10-CM | POA: Insufficient documentation

## 2019-05-03 DIAGNOSIS — E119 Type 2 diabetes mellitus without complications: Secondary | ICD-10-CM | POA: Insufficient documentation

## 2019-05-03 DIAGNOSIS — E7800 Pure hypercholesterolemia, unspecified: Secondary | ICD-10-CM | POA: Insufficient documentation

## 2019-05-03 DIAGNOSIS — Z79811 Long term (current) use of aromatase inhibitors: Secondary | ICD-10-CM | POA: Diagnosis not present

## 2019-05-03 DIAGNOSIS — K219 Gastro-esophageal reflux disease without esophagitis: Secondary | ICD-10-CM | POA: Insufficient documentation

## 2019-05-03 DIAGNOSIS — I10 Essential (primary) hypertension: Secondary | ICD-10-CM | POA: Insufficient documentation

## 2019-05-03 DIAGNOSIS — L818 Other specified disorders of pigmentation: Secondary | ICD-10-CM | POA: Insufficient documentation

## 2019-05-03 DIAGNOSIS — E039 Hypothyroidism, unspecified: Secondary | ICD-10-CM | POA: Diagnosis not present

## 2019-05-03 DIAGNOSIS — Z7982 Long term (current) use of aspirin: Secondary | ICD-10-CM | POA: Insufficient documentation

## 2019-05-03 DIAGNOSIS — Z7984 Long term (current) use of oral hypoglycemic drugs: Secondary | ICD-10-CM | POA: Diagnosis not present

## 2019-05-03 DIAGNOSIS — R232 Flushing: Secondary | ICD-10-CM | POA: Diagnosis not present

## 2019-05-03 LAB — HEPATIC FUNCTION PANEL
ALT: 33 U/L (ref 0–44)
AST: 35 U/L (ref 15–41)
Albumin: 3.8 g/dL (ref 3.5–5.0)
Alkaline Phosphatase: 80 U/L (ref 38–126)
Bilirubin, Direct: <0.1 mg/dL (ref 0.0–0.2)
Total Bilirubin: 0.5 mg/dL (ref 0.3–1.2)
Total Protein: 7.7 g/dL (ref 6.5–8.1)

## 2019-05-03 NOTE — Progress Notes (Signed)
Pt phoned for pre assessment for doximity visit tomorrow. Pt denies any concerns today.

## 2019-05-04 ENCOUNTER — Other Ambulatory Visit: Payer: Medicare HMO

## 2019-05-04 ENCOUNTER — Inpatient Hospital Stay (HOSPITAL_BASED_OUTPATIENT_CLINIC_OR_DEPARTMENT_OTHER): Payer: Medicare HMO | Admitting: Hematology and Oncology

## 2019-05-04 DIAGNOSIS — Z170 Estrogen receptor positive status [ER+]: Secondary | ICD-10-CM | POA: Diagnosis not present

## 2019-05-04 DIAGNOSIS — C50212 Malignant neoplasm of upper-inner quadrant of left female breast: Secondary | ICD-10-CM

## 2019-05-11 ENCOUNTER — Ambulatory Visit: Payer: Medicare HMO | Admitting: Radiation Oncology

## 2019-05-31 ENCOUNTER — Ambulatory Visit
Admission: RE | Admit: 2019-05-31 | Discharge: 2019-05-31 | Disposition: A | Discharge status: Home / Self Care | Payer: Medicare HMO | Source: Ambulatory Visit | Attending: Radiation Oncology | Admitting: Radiation Oncology

## 2019-05-31 ENCOUNTER — Encounter: Payer: Self-pay | Admitting: Radiation Oncology

## 2019-05-31 ENCOUNTER — Other Ambulatory Visit: Payer: Self-pay

## 2019-05-31 ENCOUNTER — Telehealth: Payer: Self-pay

## 2019-05-31 DIAGNOSIS — Z923 Personal history of irradiation: Secondary | ICD-10-CM | POA: Insufficient documentation

## 2019-05-31 DIAGNOSIS — Z170 Estrogen receptor positive status [ER+]: Secondary | ICD-10-CM | POA: Insufficient documentation

## 2019-05-31 DIAGNOSIS — C50212 Malignant neoplasm of upper-inner quadrant of left female breast: Secondary | ICD-10-CM

## 2019-05-31 DIAGNOSIS — Z79811 Long term (current) use of aromatase inhibitors: Secondary | ICD-10-CM | POA: Diagnosis not present

## 2019-05-31 DIAGNOSIS — C50912 Malignant neoplasm of unspecified site of left female breast: Secondary | ICD-10-CM | POA: Insufficient documentation

## 2019-05-31 NOTE — Telephone Encounter (Signed)
Left a message to inform the patient about the hair loss, no answer i have informd the patient to call back to the office.

## 2019-05-31 NOTE — Telephone Encounter (Signed)
-----   Message from Lequita Asal, MD sent at 05/31/2019  2:39 PM EST ----- Contact: [phone]  Loma Sousa,  Can you call the patient?  All of the hormone pills have a VERY low risk of hair loss: Letrozole (Femara) < 5% - she is on this Arimidex 2-5% Aromasin 15%.  She should have thyroid function tests (TSH and free T4).  M ----- Message ----- From: Secundino Ginger Sent: 05/31/2019   1:58 PM EST To: Lequita Asal, MD, Vito Berger, CMA  This patient called and said she is on hormonal therapy and her hair is falling out. She wants to know if that is a side effect of the medication and if so Can she take something else.

## 2019-05-31 NOTE — Progress Notes (Signed)
Radiation Oncology Follow up Note  Name: Caitlin Ford   Date:   05/31/2019 MRN:  [id number] DOB: Feb 06, 1951    This 69 y.o. 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  Seen today in routine follow-up she is doing well.  She specifically denies breast tenderness cough or bone pain..  She has been started on Femara tolerating it well except for hair loss.  Of asked her to contact Dr. Kem Ford office to make her aware of that.  COMPLICATIONS OF TREATMENT: none  FOLLOW UP COMPLIANCE: keeps appointments   PHYSICAL EXAM:  BP (!) (P) 157/82 (BP Location: Left Arm, Patient Position: Sitting)   Pulse (P) 88   Temp (P) 97.6 F (36.4 C) (Tympanic)   Resp (P) 16   Wt (P) 191 lb (86.6 kg)   BMI (P) 29.91 kg/m  Lungs are clear to A&P cardiac examination essentially unremarkable with regular rate and rhythm. No dominant mass or nodularity is noted in either breast in 2 positions examined. Incision is well-healed. No axillary or supraclavicular adenopathy is appreciated. Cosmetic result is excellent.  Well-developed well-nourished patient in NAD. HEENT reveals PERLA, EOMI, discs not visualized.  Oral cavity is clear. No oral mucosal lesions are identified. Neck is clear without evidence of cervical or supraclavicular adenopathy. Lungs are clear to A&P. Cardiac examination is essentially unremarkable with regular rate and rhythm without murmur rub or thrill. Abdomen is benign with no organomegaly or masses noted. Motor sensory and DTR levels are equal and symmetric in the upper and lower extremities. Cranial nerves II through XII are grossly intact. Proprioception is intact. No  peripheral adenopathy or edema is identified. No motor or sensory levels are noted. Crude visual fields are within normal range.  RADIOLOGY RESULTS: No current films to review  PLAN: Present time patient is doing well 1 month out from whole breast radiation.  I am pleased with her overall progress.  She will contact Dr. Kem Ford office about potential hair loss from letrozole.  Otherwise have asked to see her back in 4 to 5 months for follow-up.  Patient knows to call with any concerns.  I would like to take this opportunity to thank you for allowing me to participate in the care of your patient.Caitlin Filbert, MD

## 2019-06-02 ENCOUNTER — Telehealth: Payer: Self-pay

## 2019-06-02 ENCOUNTER — Other Ambulatory Visit: Payer: Self-pay | Admitting: Hematology and Oncology

## 2019-06-02 MED ORDER — ANASTROZOLE 1 MG PO TABS: 1.0000 mg | ORAL_TABLET | Freq: Every day | ORAL | 1 refills | Status: DC

## 2019-06-02 NOTE — Telephone Encounter (Signed)
-----   Message from Lequita Asal, MD sent at 06/02/2019  1:43 PM EST ----- Regarding: RE: Medication Contact: [phone]  Please call patient.  Arimidex 1 mg a day sent to [facility].  Stop Femara.  Please schedule follow-up in clinic in 1 month.  Thanks,  M ----- Message ----- From: Arlan Organ, RN Sent: 06/02/2019   1:29 PM EST To: Lequita Asal, MD Subject: RE: Medication                                 I contacted patient and explained to her everything you have mentioned. She states she no longer wants to take this medication and would like for you to call in something else for her.  ----- Message ----- From: Lequita Asal, MD Sent: 06/02/2019  11:31 AM EST To: Arlan Organ, RN Subject: RE: Medication                                  Please see my recent note.  Loma Sousa was to contact her.  The risk of hair loss with endocrine therapy is extremely low.  I am not sure that is the problem.  Dermatology may be helpful.  I am more than happy to write for a different medication.   M ----- Message ----- From: Secundino Ginger Sent: 06/02/2019   9:28 AM EST To: Arlan Organ, RN, # Subject: Medication                                     Charmine called and said she has already had Thyroid test and believes the chemo pills are making her hair fall out. She said she was not going to take them and wants to know if you can give her something else.

## 2019-06-02 NOTE — Telephone Encounter (Signed)
Contacted patient and informed her Dr. Mike Gip switched her to Arimidex 1 MG Daily and to stop taking the Femara. Advised it was sent to [facility]. Patient reports she prefers [facility] and states she will contact them to have it transferred over. Informed her Dr. Mike Gip would like to see her back in one month to evaluate her. Patient states she will call back to schedule appt. Denies any further questions.

## 2019-06-15 ENCOUNTER — Encounter: Payer: Self-pay | Admitting: *Deleted

## 2019-08-05 ENCOUNTER — Encounter: Payer: Self-pay | Admitting: Hematology and Oncology

## 2019-08-05 ENCOUNTER — Inpatient Hospital Stay: Payer: Medicare HMO | Attending: Oncology

## 2019-08-05 ENCOUNTER — Other Ambulatory Visit: Payer: Self-pay

## 2019-08-05 DIAGNOSIS — Z170 Estrogen receptor positive status [ER+]: Secondary | ICD-10-CM | POA: Insufficient documentation

## 2019-08-05 DIAGNOSIS — C50212 Malignant neoplasm of upper-inner quadrant of left female breast: Secondary | ICD-10-CM | POA: Insufficient documentation

## 2019-08-05 DIAGNOSIS — Z79811 Long term (current) use of aromatase inhibitors: Secondary | ICD-10-CM | POA: Diagnosis not present

## 2019-08-05 LAB — COMPREHENSIVE METABOLIC PANEL
ALT: 22 U/L (ref 0–44)
AST: 24 U/L (ref 15–41)
Albumin: 3.5 g/dL (ref 3.5–5.0)
Alkaline Phosphatase: 75 U/L (ref 38–126)
Anion gap: 8 (ref 5–15)
BUN: 20 mg/dL (ref 8–23)
CO2: 27 mmol/L (ref 22–32)
Calcium: 9.4 mg/dL (ref 8.9–10.3)
Chloride: 102 mmol/L (ref 98–111)
Creatinine, Ser: 1 mg/dL (ref 0.44–1.00)
GFR calc Af Amer: >60 mL/min (ref >60)
GFR calc non Af Amer: 57 mL/min — ABNORMAL LOW (ref >60)
Glucose, Bld: 247 mg/dL — ABNORMAL HIGH (ref 70–99)
Potassium: 4.7 mmol/L (ref 3.5–5.1)
Sodium: 137 mmol/L (ref 135–145)
Total Bilirubin: 0.4 mg/dL (ref 0.3–1.2)
Total Protein: 7.3 g/dL (ref 6.5–8.1)

## 2019-08-05 LAB — CBC WITH DIFFERENTIAL/PLATELET
Abs Immature Granulocytes: 0.01 10*3/uL (ref 0.00–0.07)
Basophils Absolute: 0 10*3/uL (ref 0.0–0.1)
Basophils Relative: 1 %
Eosinophils Absolute: 0.1 10*3/uL (ref 0.0–0.5)
Eosinophils Relative: 3 %
HCT: 35.4 % — ABNORMAL LOW (ref 36.0–46.0)
Hemoglobin: 11.4 g/dL — ABNORMAL LOW (ref 12.0–15.0)
Immature Granulocytes: 0 %
Lymphocytes Relative: 28 %
Lymphs Abs: 1.3 10*3/uL (ref 0.7–4.0)
MCH: 27.7 pg (ref 26.0–34.0)
MCHC: 32.2 g/dL (ref 30.0–36.0)
MCV: 86.1 fL (ref 80.0–100.0)
Monocytes Absolute: 0.4 10*3/uL (ref 0.1–1.0)
Monocytes Relative: 9 %
Neutro Abs: 2.9 10*3/uL (ref 1.7–7.7)
Neutrophils Relative %: 59 %
Platelets: 180 10*3/uL (ref 150–400)
RBC: 4.11 MIL/uL (ref 3.87–5.11)
RDW: 15.2 % (ref 11.5–15.5)
WBC: 4.8 10*3/uL (ref 4.0–10.5)
nRBC: 0 % (ref 0.0–0.2)

## 2019-08-05 NOTE — Progress Notes (Signed)
No new changes noted today. The patient Name and DOB has been verified by phone today. 

## 2019-08-06 LAB — CANCER ANTIGEN 27.29: CA 27.29: 22.6 U/mL (ref 0.0–38.6)

## 2019-08-06 NOTE — Progress Notes (Signed)
[facility]  [address], [address] [place], [place] [address] Phone: [phone]  Fax: [phone]   Telemedicine Office Visit:  08/08/2019  Referring physician: Ezequiel Kayser, MD  I connected with [name] on 08/08/2019 at 12:07 PM by videoconferencing and verified that I was speaking with the correct person using 2 identifiers.  The patient was at work.  I discussed the limitations, risk, security and privacy concerns of performing an evaluation and management service by videoconferencing and the availability of in person appointments.  I also discussed with the patient that there may be a patient responsible charge related to this service.  The patient expressed understanding and agreed to proceed.   Chief Complaint: Caitlin Ford is a 69 y.o. female  with stage I left breast cancer on Femara who is seen for a 3 month assessment.    HPI: The patient was last seen in the medical oncology clinic on 05/04/2019 via telemedicine. At that time, she was doing well on Femara. She noted mild hot flashes at night. She described mild hyperpigmentation s/p radiation. Hepatic function panel was normal.   Patient was seen by Dr. Baruch Gouty on 05/31/2019. She was doing well. She was tolerating Femara. She noted some hair loss. She had no breast concerns. Patient will follow up in 4 to 5 months.   She was contacted on 05/31/2015 regarding the risk of hair loss with aromatase inhibitors (<5% with Femara, 2-5% with Arinidex, and 15% with Aromasin).  Femara was stopped on 06/02/2019 and Arimidex started.   Labs on 08/05/2019 included hematocrit 35.4, hemoglobin 11.4, platelets 180,000, WBC 4,800. CMP was normal. CA 27.29 was 22.6.   During the interim, the patient felt "good". She continues to take letrozole. She reports minimal hair loss. She took Arimidex for a few weeks and stopped because she felt bloated (resolved). Her hot flashes are mild. She had no abdominal  symptoms. Her weight is stable. Patient performs regular breast exams. She notes some discomfort near the incision site.  Patient requested to be seen in clinic for a breast exam.    Past Medical History:  Diagnosis Date  . Arthritis   . Breast cancer ([place])   . Diabetes mellitus without complication ([place])   . GERD (gastroesophageal reflux disease)    occ no meds  . Goiter   . Headache    simus  . Hypercholesteremia   . Hypertension   . Hypothyroidism   . Malignant neoplasm of upper-inner quadrant of left breast in female, estrogen receptor positive ([place]) 02/04/2019  . Proteinuria   . Wears dentures    partial upper, full lower    Past Surgical History:  Procedure Laterality Date  . ABDOMINAL HYSTERECTOMY    . BREAST BIOPSY Left 12/14/2018   [facility]  . BREAST LUMPECTOMY Left 01/14/2019  . COLONOSCOPY WITH PROPOFOL N/A 02/09/2015   Procedure: COLONOSCOPY WITH PROPOFOL;  Surgeon: Hulen Luster, MD;  Location: [facility] ENDOSCOPY;  Service: Gastroenterology;  Laterality: N/A;  . PARTIAL MASTECTOMY WITH NEEDLE LOCALIZATION AND AXILLARY SENTINEL LYMPH NODE BX Left 01/14/2019   Procedure: PARTIAL MASTECTOMY WITH NEEDLE LOCALIZATION AND AXILLARY SENTINEL LYMPH NODE BX;  Surgeon: Benjamine Sprague, DO;  Location: [facility] ORS;  Service: General;  Laterality: Left;  . THYROIDECTOMY      Family History  Problem Relation Age of Onset  . Diabetes Mother   . Diabetes Maternal Aunt   . Diabetes Maternal Uncle   . Breast cancer Neg Hx  Social History:  reports that she has never smoked. She has never used smokeless tobacco. She reports that she does not drink alcohol or use drugs. 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 She has two children. She lives in [place]. The patient is alone today.  Participants in the patient's visit and their role in the encounter included the patient and Vito Berger, CMA, today. The intake visit was provided by Vito Berger,  CMA.  Allergies:  Allergies  Allergen Reactions  . Altace [Ramipril] Other (See Comments)    Angioedema  . Avandia [Rosiglitazone] Other (See Comments)     Muscle Aches  . Crestor [Rosuvastatin] Other (See Comments)    Muscle ache  . Doxycycline Other (See Comments)    Muscle Aches  . Lipitor [Atorvastatin] Other (See Comments)    Muscle ache    Current Medications: Current Outpatient Medications  Medication Sig Dispense Refill  . aspirin EC 81 MG tablet Take 81 mg by mouth daily.     . cetirizine (ZYRTEC) 10 MG tablet Take 10 mg by mouth as needed.     . fluticasone (FLONASE) 50 MCG/ACT nasal spray Place 1 spray into both nostrils as needed.     [name] glimepiride (AMARYL) 4 MG tablet Take 4 mg by mouth in the morning and at bedtime. Combines this with the 2 mg glimepiride    . letrozole (FEMARA) 2.5 MG tablet Take 1 tablet (2.5 mg total) by mouth daily. 30 tablet 2  . levothyroxine (SYNTHROID) 125 MCG tablet Take 125 mcg by mouth daily before breakfast.     . losartan (COZAAR) 100 MG tablet Take 100 mg by mouth every morning.     . metFORMIN (GLUCOPHAGE-XR) 500 MG 24 hr tablet Take 1,000 mg by mouth 2 (two) times daily.     . metoprolol succinate (TOPROL-XL) 25 MG 24 hr tablet Take 25 mg by mouth daily. Unsure of dose/ just started 8/20    . Multiple Vitamin (MULTI-VITAMIN) tablet Take 1 tablet by mouth daily.     . simvastatin (ZOCOR) 40 MG tablet Take 40 mg by mouth daily at 6 PM.     . anastrozole (ARIMIDEX) 1 MG tablet Take 1 tablet (1 mg total) by mouth daily. (Patient not taking: Reported on 08/05/2019) 30 tablet 1  . ibuprofen (ADVIL) 800 MG tablet Take 1 tablet (800 mg total) by mouth every 8 (eight) hours as needed for mild pain or moderate pain. (Patient not taking: Reported on 08/05/2019) 30 tablet 0   No current facility-administered medications for this visit.     Review of Systems  Constitutional: Negative for chills, diaphoresis, fever, malaise/fatigue and weight loss  (stable).       Feels "good".  HENT: Negative.  Negative for congestion, ear pain, hearing loss, nosebleeds, sinus pain and sore throat.        Minimal hair loss.  Eyes: Negative.  Negative for blurred vision, double vision and photophobia.  Respiratory: Negative.  Negative for cough, sputum production, shortness of breath and wheezing.   Cardiovascular: Positive for chest pain (discomfort near the incision site). Negative for palpitations, orthopnea, leg swelling and PND.  Gastrointestinal: Negative.  Negative for abdominal pain, blood in stool, constipation, diarrhea, melena, nausea and vomiting.  Genitourinary: Negative.  Negative for dysuria, frequency, hematuria and urgency.  Musculoskeletal: Negative.  Negative for back pain, joint pain and myalgias.  Skin: Negative.  Negative for rash.       Little hyperpigmentation.  Neurological: Negative.  Negative for dizziness, tingling, sensory change, speech change, focal weakness, weakness and headaches.  Endo/Heme/Allergies: Negative.  Does not bruise/bleed easily.       Mild hot flashes at night.  Psychiatric/Behavioral: Negative.  Negative for depression and memory loss. The patient is not nervous/anxious and does not have insomnia.   All other systems reviewed and are negative.   Performance status (ECOG): 0-1  Physical Exam  Constitutional: She is oriented to person, place, and time. She appears well-developed and well-nourished. No distress.  HENT:  Head: Normocephalic and atraumatic.  Short dark hair. Mask.  Eyes: Conjunctivae and EOM are normal. No scleral icterus.  Glasses.  Brown eyes.  Neurological: She is alert and oriented to person, place, and time.  Skin: She is not diaphoretic. No pallor.  Psychiatric: She has a normal mood and affect. Her behavior is normal. Judgment and thought content normal.  Nursing note reviewed.   No visits with results within 3 Day(s) from this visit.  Latest known visit with results is:    Appointment on 08/05/2019  Component Date Value Ref Range Status  . CA 27.29 08/05/2019 22.6  0.0 - 38.6 U/mL Final   Comment: (NOTE) Siemens Centaur Immunochemiluminometric Methodology [facility]) Values obtained with different assay methods or kits cannot be used interchangeably. Results cannot be interpreted as absolute evidence of the presence or absence of malignant disease. Performed At: [facility] [place], [place] [id number] Rush Farmer MD [id number]   . Sodium 08/05/2019 137  135 - 145 mmol/L Final  . Potassium 08/05/2019 4.7  3.5 - 5.1 mmol/L Final  . Chloride 08/05/2019 102  98 - 111 mmol/L Final  . CO2 08/05/2019 27  22 - 32 mmol/L Final  . Glucose, Bld 08/05/2019 247* 70 - 99 mg/dL Final   Glucose reference range applies only to samples taken after fasting for at least 8 hours.  . BUN 08/05/2019 20  8 - 23 mg/dL Final  . Creatinine, Ser 08/05/2019 1.00  0.44 - 1.00 mg/dL Final  . Calcium 08/05/2019 9.4  8.9 - 10.3 mg/dL Final  . Total Protein 08/05/2019 7.3  6.5 - 8.1 g/dL Final  . Albumin 08/05/2019 3.5  3.5 - 5.0 g/dL Final  . AST 08/05/2019 24  15 - 41 U/L Final  . ALT 08/05/2019 22  0 - 44 U/L Final  . Alkaline Phosphatase 08/05/2019 75  38 - 126 U/L Final  . Total Bilirubin 08/05/2019 0.4  0.3 - 1.2 mg/dL Final  . GFR calc non Af Amer 08/05/2019 57* >60 mL/min Final  . GFR calc Af Amer 08/05/2019 >60  >60 mL/min Final  . Anion gap 08/05/2019 8  5 - 15 Final   Performed at [facility], [address], [place], [place] [address]  . WBC 08/05/2019 4.8  4.0 - 10.5 K/uL Final  . RBC 08/05/2019 4.11  3.87 - 5.11 MIL/uL Final  . Hemoglobin 08/05/2019 11.4* 12.0 - 15.0 g/dL Final  . HCT 08/05/2019 35.4* 36.0 - 46.0 % Final  . MCV 08/05/2019 86.1  80.0 - 100.0 fL Final  . MCH 08/05/2019 27.7  26.0 - 34.0 pg Final  . MCHC 08/05/2019 32.2  30.0 - 36.0 g/dL Final  . RDW 08/05/2019 15.2  11.5 - 15.5 % Final  . Platelets 08/05/2019  180  150 - 400 K/uL Final  . nRBC 08/05/2019 0.0  0.0 - 0.2 % Final  . Neutrophils Relative % 08/05/2019 59  % Final  .  Neutro Abs 08/05/2019 2.9  1.7 - 7.7 K/uL Final  . Lymphocytes Relative 08/05/2019 28  % Final  . Lymphs Abs 08/05/2019 1.3  0.7 - 4.0 K/uL Final  . Monocytes Relative 08/05/2019 9  % Final  . Monocytes Absolute 08/05/2019 0.4  0.1 - 1.0 K/uL Final  . Eosinophils Relative 08/05/2019 3  % Final  . Eosinophils Absolute 08/05/2019 0.1  0.0 - 0.5 K/uL Final  . Basophils Relative 08/05/2019 1  % Final  . Basophils Absolute 08/05/2019 0.0  0.0 - 0.1 K/uL Final  . Immature Granulocytes 08/05/2019 0  % Final  . Abs Immature Granulocytes 08/05/2019 0.01  0.00 - 0.07 K/uL Final   Performed at [facility], [address], [place], [place] [address]    Assessment:  Shawntell Dixson is a 69 y.o. female withstage IAleft breast cancers/plumpectomy and axillary lymph node biopsy on 01/14/2019. 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 Two sentinel lymph nodes negative for malignancy.Tumor wasER+>90%, PR +>90%, andHER-2/neu negative (1+).Pathologicstagewas pT1b pN0.  Bilateral screening mammogramon 11/25/2018 revealed a possible distortion in the left breast. Bilateral diagnostic mammogramon 12/02/2018 revealed a persistent left breast distortion in the upper inner quadrant, middle depth.   Sheunderwentbreastradiationfrom09/23/2020 - 03/21/2019.She began Femara on 03/31/2019.  CA27.29 has been followed:15 on07/31/2020, 15.2 on 03/14/2019, 16.9 on 03/31/2019, and 22.6 on 08/05/2019.  Bone densityon 10/21/2018 was normalwith a T-score of +0.3 in the left femoral neck and + 1.3 in AP spine L1-L4.  Symptomatically, she notes animal hair loss on Femara.  She notes some discomfort at her incision site.  She denies any breast masses or changes.  Plan: 1.   Review labs from  08/05/2019. 2.Clinical stage I left breast cancer She is s/p lumpectomy and SLN biopsy (01/14/2019). She is s/p breast radiation (completed 03/21/2019). She began Femara on 03/31/2019.   She noted hair loss associated with Femara (incidence < 5%).  Arimidex caused bloating and thus she restarted Femara.  She is tolerating Femara again with minimal hair loss. 3. Bilateral mammogram 12/02/2019. 4.   RTC per patient's schedule for breast exam. 5.   RTC in 3 months for MD assessment, labs (CBC with diff, CMP, CA27.29).   I discussed the assessment and treatment plan with the patient.  The patient was provided an opportunity to ask questions and all were answered.  The patient agreed with the plan and demonstrated an understanding of the instructions.  The patient was advised to call back or seek an in person evaluation if the symptoms worsen or if the condition fails to improve as anticipated.  I provided 10 minutes (12:07 PM - 12:16 AM) of face-to-face video visit time during this this encounter and > 50% was spent counseling as documented under my assessment and plan.  I provided these services from the [facility] office.   Nolon Stalls, MD, PhD  08/08/2019, 12:07 PM  I, Selena Batten, am acting as scribe for [employer]. Mike Gip, MD, PhD.  I, Charon Smedberg C. Mike Gip, MD, have reviewed the above documentation for accuracy and completeness, and I agree with the above.

## 2019-08-08 ENCOUNTER — Encounter: Payer: Self-pay | Admitting: Hematology and Oncology

## 2019-08-08 ENCOUNTER — Inpatient Hospital Stay: Payer: Medicare HMO

## 2019-08-08 ENCOUNTER — Inpatient Hospital Stay (HOSPITAL_BASED_OUTPATIENT_CLINIC_OR_DEPARTMENT_OTHER): Payer: Medicare HMO | Admitting: Hematology and Oncology

## 2019-08-08 DIAGNOSIS — C50212 Malignant neoplasm of upper-inner quadrant of left female breast: Secondary | ICD-10-CM | POA: Diagnosis not present

## 2019-08-08 DIAGNOSIS — L659 Nonscarring hair loss, unspecified: Secondary | ICD-10-CM | POA: Diagnosis not present

## 2019-08-08 DIAGNOSIS — Z170 Estrogen receptor positive status [ER+]: Secondary | ICD-10-CM

## 2019-08-08 MED ORDER — LETROZOLE 2.5 MG PO TABS: 2.5000 mg | ORAL_TABLET | Freq: Every day | ORAL | 3 refills | Status: DC

## 2019-09-10 NOTE — Progress Notes (Signed)
[facility]  [address], [address] [place], [place] [address] Phone: [phone]  Fax: [phone]   Clinic Day:  09/12/2019  Referring physician: Ezequiel Kayser, MD  Chief Complaint: Caitlin Ford is a 69 y.o. female with stage I left breast canceron Femara who is seen for breast exam.  HPI: The patient was last seen in the medical oncology clinic on 08/08/2019 via telemedicine. At that time, she felt "good".  She continued Femara and noted minimal hair loss.  She had stopped Arimidex as it made her feel bloated.  She described some discomfort at her incision site.  She requested to be seen in follow-up for a breast exam.  Labs on 08/05/2019 revealed hematocrit 35.4, hemoglobin 11.4, platelets 180,000, WBC 4,800. CMP was normal. CA 27.29 was 22.6.   During the interim, she has been doing well. She notes leg and ankles edema during the day. She has less frequent hot flashes. Patient performs monthly breast exams. She had her second COVID-19 vaccine in 06/2019.    Past Medical History:  Diagnosis Date  . Arthritis   . Breast cancer ([place])   . Diabetes mellitus without complication ([place])   . GERD (gastroesophageal reflux disease)    occ no meds  . Goiter   . Headache    simus  . Hypercholesteremia   . Hypertension   . Hypothyroidism   . Malignant neoplasm of upper-inner quadrant of left breast in female, estrogen receptor positive ([place]) 02/04/2019  . Proteinuria   . Wears dentures    partial upper, full lower    Past Surgical History:  Procedure Laterality Date  . ABDOMINAL HYSTERECTOMY    . BREAST BIOPSY Left 12/14/2018   [facility]  . BREAST LUMPECTOMY Left 01/14/2019  . COLONOSCOPY WITH PROPOFOL N/A 02/09/2015   Procedure: COLONOSCOPY WITH PROPOFOL;  Surgeon: Hulen Luster, MD;  Location: [facility] ENDOSCOPY;  Service: Gastroenterology;  Laterality: N/A;  . PARTIAL MASTECTOMY WITH NEEDLE LOCALIZATION AND AXILLARY SENTINEL LYMPH NODE BX Left 01/14/2019    Procedure: PARTIAL MASTECTOMY WITH NEEDLE LOCALIZATION AND AXILLARY SENTINEL LYMPH NODE BX;  Surgeon: Benjamine Sprague, DO;  Location: [facility] ORS;  Service: General;  Laterality: Left;  . THYROIDECTOMY      Family History  Problem Relation Age of Onset  . Diabetes Mother   . Diabetes Maternal Aunt   . Diabetes Maternal Uncle   . Breast cancer Neg Hx     Social History:  reports that she has never smoked. She has never used smokeless tobacco. She reports that she does not drink alcohol or use drugs. 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 She has two children. She lives in [place]. The patient is alone today.  Allergies:  Allergies  Allergen Reactions  . Altace [Ramipril] Other (See Comments)    Angioedema  . Avandia [Rosiglitazone] Other (See Comments)     Muscle Aches  . Crestor [Rosuvastatin] Other (See Comments)    Muscle ache  . Doxycycline Other (See Comments)    Muscle Aches  . Lipitor [Atorvastatin] Other (See Comments)    Muscle ache    Current Medications: Current Outpatient Medications  Medication Sig Dispense Refill  . aspirin EC 81 MG tablet Take 81 mg by mouth daily.     . cetirizine (ZYRTEC) 10 MG tablet Take 10 mg by mouth as needed.     . fluticasone (FLONASE) 50 MCG/ACT nasal spray Place 1 spray into both nostrils as needed.     [name]  glimepiride (AMARYL) 4 MG tablet Take 4 mg by mouth in the morning and at bedtime. Combines this with the 2 mg glimepiride    . letrozole (FEMARA) 2.5 MG tablet Take 1 tablet (2.5 mg total) by mouth daily. 90 tablet 3  . levothyroxine (SYNTHROID) 125 MCG tablet Take 125 mcg by mouth daily before breakfast.     . losartan (COZAAR) 100 MG tablet Take 100 mg by mouth every morning.     . metFORMIN (GLUCOPHAGE-XR) 500 MG 24 hr tablet Take 1,000 mg by mouth 2 (two) times daily.     . metoprolol succinate (TOPROL-XL) 25 MG 24 hr tablet Take 25 mg by mouth daily. Unsure of dose/ just started 8/20    .  Multiple Vitamin (MULTI-VITAMIN) tablet Take 1 tablet by mouth daily.     . simvastatin (ZOCOR) 40 MG tablet Take 40 mg by mouth daily at 6 PM.     . anastrozole (ARIMIDEX) 1 MG tablet Take 1 tablet (1 mg total) by mouth daily. (Patient not taking: Reported on 08/05/2019) 30 tablet 1  . ibuprofen (ADVIL) 800 MG tablet Take 1 tablet (800 mg total) by mouth every 8 (eight) hours as needed for mild pain or moderate pain. (Patient not taking: Reported on 08/05/2019) 30 tablet 0   No current facility-administered medications for this visit.    Review of Systems  Constitutional: Negative for chills, diaphoresis, fever, malaise/fatigue and weight loss (up 3 lbs).       Doing well.  HENT: Negative for congestion, ear discharge, ear pain, hearing loss, nosebleeds, sinus pain, sore throat and tinnitus.        Minimal hair loss.  Eyes: Negative for blurred vision.  Respiratory: Negative for cough, hemoptysis, sputum production and shortness of breath.   Cardiovascular: Positive for leg swelling (feet and ankles). Negative for chest pain, palpitations and PND.  Gastrointestinal: Negative for abdominal pain, blood in stool, constipation, diarrhea, heartburn, melena, nausea and vomiting.  Genitourinary: Negative for dysuria, frequency, hematuria and urgency.  Musculoskeletal: Negative for back pain, joint pain, myalgias and neck pain.  Skin: Negative for itching and rash.       Little hyperpigmentation.  Neurological: Negative for dizziness, tingling, sensory change and headaches.  Endo/Heme/Allergies: Does not bruise/bleed easily.       Mild hot flashes at night.  Psychiatric/Behavioral: Negative for depression and memory loss. The patient is not nervous/anxious and does not have insomnia.   All other systems reviewed and are negative.  Performance status (ECOG): 0  Vitals Blood pressure (!) 144/80, pulse 81, temperature (!) 97 F (36.1 C), temperature source Tympanic, resp. rate 18, height '5\' 7"'   (1.702 m), weight 195 lb 3.5 oz (88.6 kg), SpO2 100 %.   Physical Exam Vitals and nursing note reviewed.  Constitutional:      General: She is not in acute distress.    Appearance: She is well-developed and well-nourished. She is not diaphoretic.  HENT:     Head: Normocephalic and atraumatic.     Mouth/Throat:     Mouth: Oropharynx is clear and moist.     Pharynx: No oropharyngeal exudate.      Comments: Short dark hair.Eyes:     General: No scleral icterus.    Extraocular Movements: EOM normal.     Conjunctiva/sclera: Conjunctivae normal.     Pupils: Pupils are equal, round, and reactive to light.     Comments: Glasses. Brown eyes.  Cardiovascular:     Rate and Rhythm: Normal rate and  regular rhythm.     Heart sounds: Normal heart sounds. No murmur heard.   Pulmonary:     Effort: Pulmonary effort is normal. No respiratory distress.     Breath sounds: Normal breath sounds. No wheezing or rales.  Chest:     Chest wall: No tenderness.     Breasts:        Right: No inverted nipple, mass, nipple discharge, skin change or tenderness.        Left: Skin change (hyperpigmentation s/p radiation; tiny scarring under arm near incision) present. No inverted nipple, mass, nipple discharge or tenderness.  Abdominal:     General: Bowel sounds are normal. There is no distension.     Palpations: Abdomen is soft. There is no mass.     Tenderness: There is no abdominal tenderness. There is no guarding or rebound.  Musculoskeletal:        General: No tenderness or edema. Normal range of motion.     Cervical back: Normal range of motion and neck supple.  Lymphadenopathy:     Head:     Right side of head: No preauricular, posterior auricular or occipital adenopathy.     Left side of head: No preauricular, posterior auricular or occipital adenopathy.     Cervical: No cervical adenopathy.     Upper Body:  No axillary adenopathy present.    Right upper body: No supraclavicular adenopathy.      Left upper body: No supraclavicular adenopathy.     Lower Body: No right inguinal adenopathy. No left inguinal adenopathy.  Skin:    General: Skin is warm and dry.  Neurological:     Mental Status: She is alert and oriented to person, place, and time.  Psychiatric:        Mood and Affect: Mood and affect normal.        Behavior: Behavior normal.        Thought Content: Thought content normal.        Judgment: Judgment normal.    No visits with results within 3 Day(s) from this visit.  Latest known visit with results is:  Appointment on 08/05/2019  Component Date Value Ref Range Status  . CA 27.29 08/05/2019 22.6  0.0 - 38.6 U/mL Final   Comment: (NOTE) Siemens Centaur Immunochemiluminometric Methodology [facility]) Values obtained with different assay methods or kits cannot be used interchangeably. Results cannot be interpreted as absolute evidence of the presence or absence of malignant disease. Performed At: [facility] [place], [place] [id number] Rush Farmer MD DX:[id number]   . Sodium 08/05/2019 137  135 - 145 mmol/L Final  . Potassium 08/05/2019 4.7  3.5 - 5.1 mmol/L Final  . Chloride 08/05/2019 102  98 - 111 mmol/L Final  . CO2 08/05/2019 27  22 - 32 mmol/L Final  . Glucose, Bld 08/05/2019 247* 70 - 99 mg/dL Final   Glucose reference range applies only to samples taken after fasting for at least 8 hours.  . BUN 08/05/2019 20  8 - 23 mg/dL Final  . Creatinine, Ser 08/05/2019 1.00  0.44 - 1.00 mg/dL Final  . Calcium 08/05/2019 9.4  8.9 - 10.3 mg/dL Final  . Total Protein 08/05/2019 7.3  6.5 - 8.1 g/dL Final  . Albumin 08/05/2019 3.5  3.5 - 5.0 g/dL Final  . AST 08/05/2019 24  15 - 41 U/L Final  . ALT 08/05/2019 22  0 - 44 U/L Final  . Alkaline Phosphatase 08/05/2019 75  38 - 126  U/L Final  . Total Bilirubin 08/05/2019 0.4  0.3 - 1.2 mg/dL Final  . GFR calc non Af Amer 08/05/2019 57* >60 mL/min Final  . GFR calc Af Amer 08/05/2019 >60  >60 mL/min  Final  . Anion gap 08/05/2019 8  5 - 15 Final   Performed at [facility], [address], [place], [place] [address]  . WBC 08/05/2019 4.8  4.0 - 10.5 K/uL Final  . RBC 08/05/2019 4.11  3.87 - 5.11 MIL/uL Final  . Hemoglobin 08/05/2019 11.4* 12.0 - 15.0 g/dL Final  . HCT 08/05/2019 35.4* 36.0 - 46.0 % Final  . MCV 08/05/2019 86.1  80.0 - 100.0 fL Final  . MCH 08/05/2019 27.7  26.0 - 34.0 pg Final  . MCHC 08/05/2019 32.2  30.0 - 36.0 g/dL Final  . RDW 08/05/2019 15.2  11.5 - 15.5 % Final  . Platelets 08/05/2019 180  150 - 400 K/uL Final  . nRBC 08/05/2019 0.0  0.0 - 0.2 % Final  . Neutrophils Relative % 08/05/2019 59  % Final  . Neutro Abs 08/05/2019 2.9  1.7 - 7.7 K/uL Final  . Lymphocytes Relative 08/05/2019 28  % Final  . Lymphs Abs 08/05/2019 1.3  0.7 - 4.0 K/uL Final  . Monocytes Relative 08/05/2019 9  % Final  . Monocytes Absolute 08/05/2019 0.4  0.1 - 1.0 K/uL Final  . Eosinophils Relative 08/05/2019 3  % Final  . Eosinophils Absolute 08/05/2019 0.1  0.0 - 0.5 K/uL Final  . Basophils Relative 08/05/2019 1  % Final  . Basophils Absolute 08/05/2019 0.0  0.0 - 0.1 K/uL Final  . Immature Granulocytes 08/05/2019 0  % Final  . Abs Immature Granulocytes 08/05/2019 0.01  0.00 - 0.07 K/uL Final   Performed at [facility], [address], [place], [place] [address]    Assessment:  Zamorah Ailes is a 69 y.o. female withstage IAleft breast cancers/plumpectomy and axillary lymph node biopsy on 01/14/2019. 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 Two sentinel lymph nodes negative for malignancy.Tumor wasER+>90%, PR +>90%, andHER-2/neu negative (1+).Pathologicstagewas pT1b pN0.  Bilateral screening mammogramon 11/25/2018 revealed a possible distortion in the left breast. Bilateral diagnostic mammogramon 12/02/2018 revealed a persistent left breast distortion in the upper inner quadrant,  middle depth.   Sheunderwentbreastradiationfrom09/23/2020 - 03/21/2019.She began [facility] 03/31/2019.  CA27.29 has been followed:15 on07/31/2020,15.2 on 03/14/2019, 16.9 on 03/31/2019, and 22.6 on 08/05/2019.  Bone densityon 10/21/2018 was normalwith a T-score of +0.3 in the left femoral neck and + 1.3 in AP spine L1-L4.  She received her second COVID-19 vaccine in 06/2019.   Symptomatically, she is doing well.  She denies any breast concerns.  She has less frequent hot flashes.  Exam is stable.  Plan: 1.   Review labs from 08/05/2019. 2.Clinical stage I left breast cancer She is s/p lumpectomy and SLN biopsy (01/14/2019). She is s/p breast radiation (completed 03/21/2019). She began Femara on 03/31/2019.  Clinically she is doing well.  Breast exam reveals no evidence of recurrent disease.  Continue Femara. 3.Bilateral mammogram on 11/29/2019. 4.   RTC in 3 months for MD assessment, labs (CBC with differential, CMP, CA 27.29) and review of mammogram.  I discussed the assessment and treatment plan with the patient.  The patient was provided an opportunity to ask questions and all were answered.  The patient agreed with the plan and demonstrated an understanding of the instructions.  The patient was advised to  call back if the symptoms worsen or if the condition fails to improve as anticipated.  I provided 10 minutes of face-to-face time during this this encounter and > 50% was spent counseling as documented under my assessment and plan.    Lequita Asal, MD, PhD    09/12/2019, 8:53 AM  I, Selena Batten, am acting as scribe for [employer]. Mike Gip, MD, PhD.  I, Duglas Heier C. Mike Gip, MD, have reviewed the above documentation for accuracy and completeness, and I agree with the above.

## 2019-09-12 ENCOUNTER — Other Ambulatory Visit: Payer: Self-pay

## 2019-09-12 ENCOUNTER — Inpatient Hospital Stay: Payer: Medicare HMO | Attending: Hematology and Oncology | Admitting: Hematology and Oncology

## 2019-09-12 ENCOUNTER — Encounter: Payer: Self-pay | Admitting: Hematology and Oncology

## 2019-09-12 VITALS — BP 144/80 | HR 81 | Temp 97.0°F | Resp 18 | Ht 67.0 in | Wt 195.2 lb

## 2019-09-12 DIAGNOSIS — R232 Flushing: Secondary | ICD-10-CM | POA: Diagnosis not present

## 2019-09-12 DIAGNOSIS — C50212 Malignant neoplasm of upper-inner quadrant of left female breast: Secondary | ICD-10-CM | POA: Diagnosis not present

## 2019-09-12 DIAGNOSIS — E039 Hypothyroidism, unspecified: Secondary | ICD-10-CM | POA: Diagnosis not present

## 2019-09-12 DIAGNOSIS — I10 Essential (primary) hypertension: Secondary | ICD-10-CM | POA: Insufficient documentation

## 2019-09-12 DIAGNOSIS — Z79811 Long term (current) use of aromatase inhibitors: Secondary | ICD-10-CM | POA: Diagnosis not present

## 2019-09-12 DIAGNOSIS — M1990 Unspecified osteoarthritis, unspecified site: Secondary | ICD-10-CM | POA: Insufficient documentation

## 2019-09-12 DIAGNOSIS — E119 Type 2 diabetes mellitus without complications: Secondary | ICD-10-CM | POA: Insufficient documentation

## 2019-09-12 DIAGNOSIS — Z170 Estrogen receptor positive status [ER+]: Secondary | ICD-10-CM | POA: Diagnosis not present

## 2019-09-12 DIAGNOSIS — Z7984 Long term (current) use of oral hypoglycemic drugs: Secondary | ICD-10-CM | POA: Diagnosis not present

## 2019-09-12 DIAGNOSIS — Z791 Long term (current) use of non-steroidal anti-inflammatories (NSAID): Secondary | ICD-10-CM | POA: Diagnosis not present

## 2019-09-12 DIAGNOSIS — Z7982 Long term (current) use of aspirin: Secondary | ICD-10-CM | POA: Diagnosis not present

## 2019-09-12 DIAGNOSIS — Z79899 Other long term (current) drug therapy: Secondary | ICD-10-CM | POA: Insufficient documentation

## 2019-09-12 DIAGNOSIS — K219 Gastro-esophageal reflux disease without esophagitis: Secondary | ICD-10-CM | POA: Diagnosis not present

## 2019-09-12 DIAGNOSIS — E7800 Pure hypercholesterolemia, unspecified: Secondary | ICD-10-CM | POA: Diagnosis not present

## 2019-09-12 NOTE — Progress Notes (Signed)
The patient reports having constipation at times.

## 2019-10-13 ENCOUNTER — Ambulatory Visit: Payer: Medicare HMO | Admitting: Radiation Oncology

## 2019-10-20 ENCOUNTER — Ambulatory Visit: Payer: Medicare HMO | Admitting: Radiation Oncology

## 2019-11-07 ENCOUNTER — Other Ambulatory Visit: Payer: Medicare HMO

## 2019-11-07 ENCOUNTER — Ambulatory Visit: Payer: Medicare HMO | Admitting: Hematology and Oncology

## 2019-11-29 ENCOUNTER — Ambulatory Visit
Admission: RE | Admit: 2019-11-29 | Discharge: 2019-11-29 | Disposition: A | Discharge status: Home / Self Care | Payer: Medicare HMO | Source: Ambulatory Visit | Attending: Hematology and Oncology | Admitting: Hematology and Oncology

## 2019-11-29 DIAGNOSIS — Z170 Estrogen receptor positive status [ER+]: Secondary | ICD-10-CM | POA: Diagnosis present

## 2019-11-29 DIAGNOSIS — C50212 Malignant neoplasm of upper-inner quadrant of left female breast: Secondary | ICD-10-CM | POA: Diagnosis not present

## 2019-12-07 ENCOUNTER — Other Ambulatory Visit: Payer: Self-pay | Admitting: Surgery

## 2019-12-07 DIAGNOSIS — Z853 Personal history of malignant neoplasm of breast: Secondary | ICD-10-CM

## 2019-12-11 ENCOUNTER — Encounter: Payer: Self-pay | Admitting: Hematology and Oncology

## 2019-12-11 NOTE — Progress Notes (Signed)
[facility]  [address], [address] [place], [place] [address] Phone: [phone]  Fax: [phone]   Clinic Day:  12/12/2019  Referring physician: Ezequiel Kayser, MD  Chief Complaint: Caitlin Ford is a 69 y.o. female with stage I left breast canceron Femara who is seen for a 3 month assessment.   HPI: The patient was last seen in the medical oncology clinic on 09/12/2019. At that time, she was doing well. She denied any breast concerns. She had less frequent hot flashes. Exam was stable. Labs from 08/05/2019 showed hematocrit 35.4, hemoglobin 11.4, platelets 180,000, WBC 4,800. CA 27.29 was 22.6. She continued Femara.   She saw Dr. Saralyn Pilar on 10/10/2019. She was doing well. Her dyspnea resolved after stopping Actos. Patient was encouraged to continue her current regimen and low sodium diet. Follow up planned for 02/10/2020.  Bilateral diagnostic mammogram on 11/29/2019 revealed expected postsurgical changes in the left breast. There were no mammographic evidence of malignancy bilaterally.  During the interim, she has continued to do well. She has mild hot flashes. She remains on Femara. Her joints are "ok". Her feet and ankles swell when she works.  Her minimal hair loss has resolved.  She examines her breast monthly.  She denies any concerns.  She is on a low sodium diet.    Past Medical History:  Diagnosis Date  . Arthritis   . Breast cancer ([place])   . Diabetes mellitus without complication ([place])   . GERD (gastroesophageal reflux disease)    occ no meds  . Goiter   . Headache    simus  . Hypercholesteremia   . Hypertension   . Hypothyroidism   . Malignant neoplasm of upper-inner quadrant of left breast in female, estrogen receptor positive ([place]) 02/04/2019  . Proteinuria   . Wears dentures    partial upper, full lower    Past Surgical History:  Procedure Laterality Date  . ABDOMINAL HYSTERECTOMY    . BREAST BIOPSY Left 12/14/2018   [facility]    . BREAST LUMPECTOMY Left 01/14/2019   [facility]  . COLONOSCOPY WITH PROPOFOL N/A 02/09/2015   Procedure: COLONOSCOPY WITH PROPOFOL;  Surgeon: Hulen Luster, MD;  Location: [facility] ENDOSCOPY;  Service: Gastroenterology;  Laterality: N/A;  . PARTIAL MASTECTOMY WITH NEEDLE LOCALIZATION AND AXILLARY SENTINEL LYMPH NODE BX Left 01/14/2019   Procedure: PARTIAL MASTECTOMY WITH NEEDLE LOCALIZATION AND AXILLARY SENTINEL LYMPH NODE BX;  Surgeon: Benjamine Sprague, DO;  Location: [facility] ORS;  Service: General;  Laterality: Left;  . THYROIDECTOMY      Family History  Problem Relation Age of Onset  . Diabetes Mother   . Diabetes Maternal Aunt   . Diabetes Maternal Uncle   . Breast cancer Neg Hx     Social History:  reports that she has never smoked. She has never used smokeless tobacco. She reports that she does not drink alcohol and does not use drugs. 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 She has two children. She lives in [place]. The patient is alone today.  Allergies:  Allergies  Allergen Reactions  . Altace [Ramipril] Other (See Comments)    Angioedema  . Avandia [Rosiglitazone] Other (See Comments)     Muscle Aches  . Crestor [Rosuvastatin] Other (See Comments)    Muscle ache  . Doxycycline Other (See Comments)    Muscle Aches  . Lipitor [Atorvastatin] Other (See Comments)    Muscle ache    Current Medications: Current Outpatient  Medications  Medication Sig Dispense Refill  . aspirin EC 81 MG tablet Take 81 mg by mouth daily.     . cetirizine (ZYRTEC) 10 MG tablet Take 10 mg by mouth as needed.     . fluticasone (FLONASE) 50 MCG/ACT nasal spray Place 1 spray into both nostrils as needed.     [name] glimepiride (AMARYL) 4 MG tablet Take 4 mg by mouth in the morning and at bedtime. Combines this with the 2 mg glimepiride    . letrozole (FEMARA) 2.5 MG tablet Take 1 tablet (2.5 mg total) by mouth daily. 90 tablet 3  . levothyroxine (SYNTHROID) 125 MCG tablet Take  125 mcg by mouth daily before breakfast.     . losartan (COZAAR) 100 MG tablet Take 100 mg by mouth every morning.     . metFORMIN (GLUCOPHAGE-XR) 500 MG 24 hr tablet Take 1,000 mg by mouth 2 (two) times daily.     . metoprolol succinate (TOPROL-XL) 25 MG 24 hr tablet Take 25 mg by mouth daily. Unsure of dose/ just started 8/20    . Multiple Vitamin (MULTI-VITAMIN) tablet Take 1 tablet by mouth daily.     . pioglitazone (ACTOS) 15 MG tablet Take by mouth.    . simvastatin (ZOCOR) 40 MG tablet Take 40 mg by mouth daily at 6 PM.     . anastrozole (ARIMIDEX) 1 MG tablet Take 1 tablet (1 mg total) by mouth daily. (Patient not taking: Reported on 08/05/2019) 30 tablet 1  . ibuprofen (ADVIL) 800 MG tablet Take 1 tablet (800 mg total) by mouth every 8 (eight) hours as needed for mild pain or moderate pain. (Patient not taking: Reported on 08/05/2019) 30 tablet 0   No current facility-administered medications for this visit.    Review of Systems  Constitutional: Negative for chills, diaphoresis, fever, malaise/fatigue and weight loss (up 2 lbs).       Doing well.  HENT: Negative for congestion, ear discharge, ear pain, hearing loss, nosebleeds, sinus pain, sore throat and tinnitus.        Minimal hair loss, resolved.  Eyes: Negative for blurred vision.  Respiratory: Negative for cough, hemoptysis, sputum production and shortness of breath.   Cardiovascular: Positive for leg swelling (feet and ankles). Negative for chest pain, palpitations and PND.  Gastrointestinal: Negative for abdominal pain, blood in stool, constipation, diarrhea, heartburn, melena, nausea and vomiting.       Low sodium diet.  Genitourinary: Negative for dysuria, frequency, hematuria and urgency.  Musculoskeletal: Negative for back pain, joint pain, myalgias and neck pain.  Skin: Negative for itching and rash.       Little hyperpigmentation.  Neurological: Negative for dizziness, tingling, sensory change and headaches.   Endo/Heme/Allergies: Does not bruise/bleed easily.       Mild hot flashes.  Psychiatric/Behavioral: Negative for depression and memory loss. The patient is not nervous/anxious and does not have insomnia.   All other systems reviewed and are negative.  Performance status (ECOG): 0  Vitals Blood pressure (!) 157/81, pulse 68, temperature 97.9 F (36.6 C), temperature source Tympanic, weight 197 lb 8.5 oz (89.6 kg), SpO2 100 %.   Physical Exam Vitals and nursing note reviewed.  Constitutional:      General: She is not in acute distress.    Appearance: She is well-developed. She is not diaphoretic.     Interventions: Face mask in place.  HENT:     Head: Normocephalic and atraumatic.     Comments: Short black hair.  Mouth/Throat:     Mouth: Mucous membranes are moist.     Pharynx: Oropharynx is clear. No oropharyngeal exudate.  Eyes:     General: No scleral icterus.    Conjunctiva/sclera: Conjunctivae normal.     Pupils: Pupils are equal, round, and reactive to light.     Comments: Glasses. Brown eyes.  Cardiovascular:     Rate and Rhythm: Normal rate and regular rhythm.     Heart sounds: Normal heart sounds. No murmur heard.   Pulmonary:     Effort: Pulmonary effort is normal. No respiratory distress.     Breath sounds: Normal breath sounds. No wheezing or rales.  Chest:     Chest wall: No tenderness.     Breasts:        Right: No inverted nipple, mass, nipple discharge, skin change or tenderness.        Left: Swelling (mild, inferiorly), skin change (hyperpigmentation s/p radiation) and tenderness (mild) present. No inverted nipple, mass or nipple discharge.  Abdominal:     General: Bowel sounds are normal. There is no distension.     Palpations: Abdomen is soft. There is no mass.     Tenderness: There is no abdominal tenderness. There is no guarding or rebound.  Musculoskeletal:        General: No tenderness. Normal range of motion.     Cervical back: Normal range of  motion and neck supple.  Lymphadenopathy:     Head:     Right side of head: No preauricular, posterior auricular or occipital adenopathy.     Left side of head: No preauricular, posterior auricular or occipital adenopathy.     Cervical: No cervical adenopathy.     Upper Body:     Right upper body: No supraclavicular or axillary adenopathy.     Left upper body: No supraclavicular or axillary adenopathy.     Lower Body: No right inguinal adenopathy. No left inguinal adenopathy.  Skin:    General: Skin is warm and dry.  Neurological:     Mental Status: She is alert and oriented to person, place, and time.  Psychiatric:        Behavior: Behavior normal.        Thought Content: Thought content normal.        Judgment: Judgment normal.    Appointment on 12/12/2019  Component Date Value Ref Range Status  . WBC 12/12/2019 4.2  4.0 - 10.5 K/uL Final  . RBC 12/12/2019 3.86* 3.87 - 5.11 MIL/uL Final  . Hemoglobin 12/12/2019 10.8* 12.0 - 15.0 g/dL Final  . HCT 12/12/2019 34.2* 36 - 46 % Final  . MCV 12/12/2019 88.6  80.0 - 100.0 fL Final  . MCH 12/12/2019 28.0  26.0 - 34.0 pg Final  . MCHC 12/12/2019 31.6  30.0 - 36.0 g/dL Final  . RDW 12/12/2019 15.7* 11.5 - 15.5 % Final  . Platelets 12/12/2019 187  150 - 400 K/uL Final  . nRBC 12/12/2019 0.0  0.0 - 0.2 % Final  . Neutrophils Relative % 12/12/2019 53  % Final  . Neutro Abs 12/12/2019 2.2  1.7 - 7.7 K/uL Final  . Lymphocytes Relative 12/12/2019 34  % Final  . Lymphs Abs 12/12/2019 1.4  0.7 - 4.0 K/uL Final  . Monocytes Relative 12/12/2019 10  % Final  . Monocytes Absolute 12/12/2019 0.4  0 - 1 K/uL Final  . Eosinophils Relative 12/12/2019 2  % Final  . Eosinophils Absolute 12/12/2019 0.1  0 - 0 K/uL  Final  . Basophils Relative 12/12/2019 1  % Final  . Basophils Absolute 12/12/2019 0.0  0 - 0 K/uL Final  . Immature Granulocytes 12/12/2019 0  % Final  . Abs Immature Granulocytes 12/12/2019 0.01  0.00 - 0.07 K/uL Final   Performed at  [facility], [address] [place], [place] [address]    Assessment:  Caitlin Ford is a 69 y.o. female withstage IAleft breast cancers/plumpectomy and axillary lymph node biopsy on 01/14/2019. 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 Two sentinel lymph nodes negative for malignancy.Tumor wasER+>90%, PR +>90%, andHER-2/neu negative (1+).Pathologicstagewas pT1b pN0.  Bilateral screening mammogramon 11/25/2018 revealed a possible distortion in the left breast. Bilateral diagnostic mammogramon 12/02/2018 revealed a persistent left breast distortion in the upper inner quadrant, middle depth.  Bilateral diagnostic mammogram on 11/29/2019 revealed expected postsurgical changes in the left breast. There were no mammographic evidence of malignancy bilaterally.  Sheunderwentbreastradiationfrom09/23/2020 - 03/21/2019.She began [facility] 03/31/2019.  CA27.29 has been followed:15 on07/31/2020,15.2 on 03/14/2019, 16.9 on 03/31/2019, and 22.6 on 08/05/2019.  Bone densityon 10/21/2018 was normalwith a T-score of +0.3 in the left femoral neck and + 1.3 in AP spine L1-L4.  She received her second COVID-19 vaccine in 06/2019.   Symptomatically, she is doing well.  Exam is stable.  Plan: 1.   Labs today: CBC with diff, CMP, CA 27.29. 2.Clinical stage I left breast cancer She is s/p lumpectomy and SLN biopsy (01/14/2019). She is s/p breast radiation (completed 03/21/2019). She began Femara on 03/31/2019.  Clinically, she continues to do well.  Breast exam reveals no evidence of recurrent disease..  Bilateral mammogram on 11/29/2019 reveals no evidence of recurrence.   Continue Femara. 3.Mild normocytic anemia  Hematocrit 35.4.  Hemoglobin 11.4.  MCV 86.1 on 08/05/2019.  Hematocrit 34.2.  Hemoglobin 10.8.  MCV 88.6 on 12/12/2019.  Patient denies any  bleeding.  Creatinine is normal.  Diet appears good.  Check additional labs at next visit. 4.   RTC in 4 months for MD assessment and labs (CBC with diff, CMP, CA 27-29, ferritin, iron studies, B12, folate).  I discussed the assessment and treatment plan with the patient.  The patient was provided an opportunity to ask questions and all were answered.  The patient agreed with the plan and demonstrated an understanding of the instructions.  The patient was advised to call back if the symptoms worsen or if the condition fails to improve as anticipated.   Lequita Asal, MD, PhD    12/12/2019, 10:00 AM  I, Selena Batten, am acting as scribe for [employer]. Mike Gip, MD, PhD.  I, Hennessey Cantrell C. Mike Gip, MD, have reviewed the above documentation for accuracy and completeness, and I agree with the above.

## 2019-12-11 NOTE — Progress Notes (Signed)
No new changes noted today. The patient name and DOB has been verified by phone today. 

## 2019-12-12 ENCOUNTER — Encounter: Payer: Self-pay | Admitting: Hematology and Oncology

## 2019-12-12 ENCOUNTER — Inpatient Hospital Stay: Payer: Medicare HMO

## 2019-12-12 ENCOUNTER — Inpatient Hospital Stay (HOSPITAL_BASED_OUTPATIENT_CLINIC_OR_DEPARTMENT_OTHER): Payer: Medicare HMO | Admitting: Hematology and Oncology

## 2019-12-12 ENCOUNTER — Other Ambulatory Visit: Payer: Self-pay

## 2019-12-12 ENCOUNTER — Inpatient Hospital Stay: Payer: Medicare HMO | Admitting: Hematology and Oncology

## 2019-12-12 ENCOUNTER — Inpatient Hospital Stay: Payer: Medicare HMO | Attending: Hematology and Oncology

## 2019-12-12 VITALS — BP 157/81 | HR 68 | Temp 97.9°F | Wt 197.5 lb

## 2019-12-12 DIAGNOSIS — E119 Type 2 diabetes mellitus without complications: Secondary | ICD-10-CM | POA: Insufficient documentation

## 2019-12-12 DIAGNOSIS — R232 Flushing: Secondary | ICD-10-CM | POA: Diagnosis not present

## 2019-12-12 DIAGNOSIS — K219 Gastro-esophageal reflux disease without esophagitis: Secondary | ICD-10-CM | POA: Insufficient documentation

## 2019-12-12 DIAGNOSIS — M1990 Unspecified osteoarthritis, unspecified site: Secondary | ICD-10-CM | POA: Diagnosis not present

## 2019-12-12 DIAGNOSIS — Z79811 Long term (current) use of aromatase inhibitors: Secondary | ICD-10-CM | POA: Diagnosis not present

## 2019-12-12 DIAGNOSIS — D649 Anemia, unspecified: Secondary | ICD-10-CM

## 2019-12-12 DIAGNOSIS — Z79899 Other long term (current) drug therapy: Secondary | ICD-10-CM | POA: Insufficient documentation

## 2019-12-12 DIAGNOSIS — I10 Essential (primary) hypertension: Secondary | ICD-10-CM | POA: Insufficient documentation

## 2019-12-12 DIAGNOSIS — Z7984 Long term (current) use of oral hypoglycemic drugs: Secondary | ICD-10-CM | POA: Diagnosis not present

## 2019-12-12 DIAGNOSIS — E7800 Pure hypercholesterolemia, unspecified: Secondary | ICD-10-CM | POA: Insufficient documentation

## 2019-12-12 DIAGNOSIS — Z170 Estrogen receptor positive status [ER+]: Secondary | ICD-10-CM | POA: Insufficient documentation

## 2019-12-12 DIAGNOSIS — C50212 Malignant neoplasm of upper-inner quadrant of left female breast: Secondary | ICD-10-CM

## 2019-12-12 DIAGNOSIS — E039 Hypothyroidism, unspecified: Secondary | ICD-10-CM | POA: Insufficient documentation

## 2019-12-12 DIAGNOSIS — Z7982 Long term (current) use of aspirin: Secondary | ICD-10-CM | POA: Insufficient documentation

## 2019-12-12 LAB — COMPREHENSIVE METABOLIC PANEL
ALT: 16 U/L (ref 0–44)
AST: 18 U/L (ref 15–41)
Albumin: 3.6 g/dL (ref 3.5–5.0)
Alkaline Phosphatase: 68 U/L (ref 38–126)
Anion gap: 8 (ref 5–15)
BUN: 20 mg/dL (ref 8–23)
CO2: 27 mmol/L (ref 22–32)
Calcium: 9.1 mg/dL (ref 8.9–10.3)
Chloride: 106 mmol/L (ref 98–111)
Creatinine, Ser: 0.99 mg/dL (ref 0.44–1.00)
GFR calc Af Amer: >60 mL/min (ref >60)
GFR calc non Af Amer: 58 mL/min — ABNORMAL LOW (ref >60)
Glucose, Bld: 78 mg/dL (ref 70–99)
Potassium: 4.5 mmol/L (ref 3.5–5.1)
Sodium: 141 mmol/L (ref 135–145)
Total Bilirubin: 0.5 mg/dL (ref 0.3–1.2)
Total Protein: 7.4 g/dL (ref 6.5–8.1)

## 2019-12-12 LAB — CBC WITH DIFFERENTIAL/PLATELET
Abs Immature Granulocytes: 0.01 10*3/uL (ref 0.00–0.07)
Basophils Absolute: 0 10*3/uL (ref 0.0–0.1)
Basophils Relative: 1 %
Eosinophils Absolute: 0.1 10*3/uL (ref 0.0–0.5)
Eosinophils Relative: 2 %
HCT: 34.2 % — ABNORMAL LOW (ref 36.0–46.0)
Hemoglobin: 10.8 g/dL — ABNORMAL LOW (ref 12.0–15.0)
Immature Granulocytes: 0 %
Lymphocytes Relative: 34 %
Lymphs Abs: 1.4 10*3/uL (ref 0.7–4.0)
MCH: 28 pg (ref 26.0–34.0)
MCHC: 31.6 g/dL (ref 30.0–36.0)
MCV: 88.6 fL (ref 80.0–100.0)
Monocytes Absolute: 0.4 10*3/uL (ref 0.1–1.0)
Monocytes Relative: 10 %
Neutro Abs: 2.2 10*3/uL (ref 1.7–7.7)
Neutrophils Relative %: 53 %
Platelets: 187 10*3/uL (ref 150–400)
RBC: 3.86 MIL/uL — ABNORMAL LOW (ref 3.87–5.11)
RDW: 15.7 % — ABNORMAL HIGH (ref 11.5–15.5)
WBC: 4.2 10*3/uL (ref 4.0–10.5)
nRBC: 0 % (ref 0.0–0.2)

## 2019-12-13 LAB — CANCER ANTIGEN 27.29: CA 27.29: 14.1 U/mL (ref 0.0–38.6)

## 2019-12-14 ENCOUNTER — Ambulatory Visit
Admission: RE | Admit: 2019-12-14 | Discharge: 2019-12-14 | Disposition: A | Discharge status: Home / Self Care | Payer: Medicare HMO | Source: Ambulatory Visit | Attending: Radiation Oncology | Admitting: Radiation Oncology

## 2019-12-21 ENCOUNTER — Other Ambulatory Visit: Payer: Self-pay

## 2019-12-21 ENCOUNTER — Encounter: Payer: Self-pay | Admitting: Radiation Oncology

## 2019-12-21 ENCOUNTER — Ambulatory Visit
Admission: RE | Admit: 2019-12-21 | Discharge: 2019-12-21 | Disposition: A | Discharge status: Home / Self Care | Payer: Medicare HMO | Source: Ambulatory Visit | Attending: Radiation Oncology | Admitting: Radiation Oncology

## 2019-12-21 VITALS — BP 146/73 | HR 77 | Temp 97.2°F | Wt 199.6 lb

## 2019-12-21 DIAGNOSIS — Z923 Personal history of irradiation: Secondary | ICD-10-CM | POA: Diagnosis not present

## 2019-12-21 DIAGNOSIS — Z79811 Long term (current) use of aromatase inhibitors: Secondary | ICD-10-CM | POA: Insufficient documentation

## 2019-12-21 DIAGNOSIS — C50212 Malignant neoplasm of upper-inner quadrant of left female breast: Secondary | ICD-10-CM | POA: Diagnosis not present

## 2019-12-21 DIAGNOSIS — Z170 Estrogen receptor positive status [ER+]: Secondary | ICD-10-CM | POA: Diagnosis not present

## 2019-12-21 NOTE — Progress Notes (Signed)
Radiation Oncology Follow up Note  Name: Caitlin Ford   Date:   12/21/2019 MRN:  [id number] DOB: 23-Feb-1951    This 69 y.o. female presents to the clinic today for 52-month follow-up status post whole breast radiation to her left breast for ER/PR positive stage I invasive mammary carcinoma.  REFERRING PROVIDER: Ezequiel Kayser, MD  HPI: Patient is a 69 year old female now out 7 months having pleated whole breast radiation to her left breast for ER/PR positive stage I invasive mammary carcinoma seen today in routine follow-up she is doing well she specifically denies breast tenderness cough or bone pain.  She is currently on.  Arimidex tolerating it well without side effect.  She has had mammograms this month which were BI-RADS 2 benign which I have reviewed.  COMPLICATIONS OF TREATMENT: none  FOLLOW UP COMPLIANCE: keeps appointments   PHYSICAL EXAM:  BP (!) 146/73    Pulse 77    Temp (!) 97.2 F (36.2 C) (Tympanic)    Wt 199 lb 9.6 oz (90.5 kg)    BMI 31.26 kg/m  Lungs are clear to A&P cardiac examination essentially unremarkable with regular rate and rhythm. No dominant mass or nodularity is noted in either breast in 2 positions examined. Incision is well-healed. No axillary or supraclavicular adenopathy is appreciated. Cosmetic result is excellent.  Well-developed well-nourished patient in NAD. HEENT reveals PERLA, EOMI, discs not visualized.  Oral cavity is clear. No oral mucosal lesions are identified. Neck is clear without evidence of cervical or supraclavicular adenopathy. Lungs are clear to A&P. Cardiac examination is essentially unremarkable with regular rate and rhythm without murmur rub or thrill. Abdomen is benign with no organomegaly or masses noted. Motor sensory and DTR levels are equal and symmetric in the upper and lower extremities. Cranial nerves II through XII are grossly intact. Proprioception is intact. No peripheral adenopathy or edema is identified. No motor or sensory  levels are noted. Crude visual fields are within normal range.  RADIOLOGY RESULTS: Mammogram reviewed compatible with above-stated findings  PLAN: Present time patient is doing well with no evidence of disease.  Of asked to see her back in 1 year for follow-up.  She continues on Arimidex without side effect.  Patient knows to call with any concerns.  I would like to take this opportunity to thank you for allowing me to participate in the care of your patient.Noreene Filbert, MD

## 2019-12-23 ENCOUNTER — Other Ambulatory Visit: Payer: Medicare HMO

## 2020-04-12 ENCOUNTER — Ambulatory Visit: Payer: Medicare HMO | Admitting: Hematology and Oncology

## 2020-04-12 ENCOUNTER — Other Ambulatory Visit: Payer: Medicare HMO

## 2020-04-23 ENCOUNTER — Other Ambulatory Visit: Payer: Self-pay

## 2020-04-23 DIAGNOSIS — C50919 Malignant neoplasm of unspecified site of unspecified female breast: Secondary | ICD-10-CM

## 2020-04-23 DIAGNOSIS — C50212 Malignant neoplasm of upper-inner quadrant of left female breast: Secondary | ICD-10-CM

## 2020-04-23 DIAGNOSIS — D649 Anemia, unspecified: Secondary | ICD-10-CM

## 2020-04-23 NOTE — Progress Notes (Signed)
[facility]  [address] [place], [place] [address] Phone: [phone]  Fax: [phone]   Clinic Day:  04/24/2020  Referring physician: Ezequiel Kayser, MD  Chief Complaint: Caitlin Ford is a 69 y.o. female with stage I left breast canceron Femara who is seen for 4 month assessment.   HPI: The patient was last seen in the medical oncology clinic on 12/12/2019. At that time, she was doing well.  Exam was stable. Hematocrit was 34.2, hemoglobin 10.8, MCV 88.6, platelets 187,000, WBC 4,200. CMP was normal. CA27.29 was 14.1. She continued Femara.  The patient followed up with Dr. Baruch Gouty on 12/21/2019. She reported no side effects from her anti-estrogen therapy. Follow up was planned for 1 year.  The patient saw Dr Lysle Pearl on 01/03/2020. He had no concerns. Follow up was planned for 1 year.  During the interim, she has been good. She still has swelling in her feet after she comes home from work. She performs monthly breast self-exams and has no concerns. Her hair thinning and hot flashes have resolved. She denies reflux.  She has new iron deficiency anemia.  She denies any bleeding.  Diet is good.  Colonoscopy on 02/09/2015 by Dr. Candace Cruise was normal. No specimens were collected.   Past Medical History:  Diagnosis Date  . Arthritis   . Breast cancer ([place])   . Diabetes mellitus without complication ([place])   . GERD (gastroesophageal reflux disease)    occ no meds  . Goiter   . Headache    simus  . Hypercholesteremia   . Hypertension   . Hypothyroidism   . Malignant neoplasm of upper-inner quadrant of left breast in female, estrogen receptor positive ([place]) 02/04/2019  . Proteinuria   . Wears dentures    partial upper, full lower    Past Surgical History:  Procedure Laterality Date  . ABDOMINAL HYSTERECTOMY    . BREAST BIOPSY Left 12/14/2018   [facility]  . BREAST LUMPECTOMY Left 01/14/2019   [place]  . COLONOSCOPY WITH PROPOFOL N/A 02/09/2015    Procedure: COLONOSCOPY WITH PROPOFOL;  Surgeon: Hulen Luster, MD;  Location: [facility] ENDOSCOPY;  Service: Gastroenterology;  Laterality: N/A;  . PARTIAL MASTECTOMY WITH NEEDLE LOCALIZATION AND AXILLARY SENTINEL LYMPH NODE BX Left 01/14/2019   Procedure: PARTIAL MASTECTOMY WITH NEEDLE LOCALIZATION AND AXILLARY SENTINEL LYMPH NODE BX;  Surgeon: Benjamine Sprague, DO;  Location: [facility] ORS;  Service: General;  Laterality: Left;  . THYROIDECTOMY      Family History  Problem Relation Age of Onset  . Diabetes Mother   . Diabetes Maternal Aunt   . Diabetes Maternal Uncle   . Breast cancer Neg Hx     Social History:  reports that she has never smoked. She has never used smokeless tobacco. She reports that she does not drink alcohol and does not use drugs. 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 She has two children. She lives in [place]. The patient is alone today.  Allergies:  Allergies  Allergen Reactions  . Altace [Ramipril] Other (See Comments)    Angioedema  . Avandia [Rosiglitazone] Other (See Comments)     Muscle Aches  . Crestor [Rosuvastatin] Other (See Comments)    Muscle ache  . Doxycycline Other (See Comments)    Muscle Aches  . Lipitor [Atorvastatin] Other (See Comments)    Muscle ache    Current Medications: Current Outpatient Medications  Medication Sig Dispense Refill  . aspirin EC 81  MG tablet Take 81 mg by mouth daily.     . cetirizine (ZYRTEC) 10 MG tablet Take 10 mg by mouth as needed.     . Cysteamine Bitartrate (PROCYSBI) 300 MG PACK See admin instructions.    . fluticasone (FLONASE) 50 MCG/ACT nasal spray Place 1 spray into both nostrils as needed.     [name] glimepiride (AMARYL) 4 MG tablet Take 4 mg by mouth in the morning and at bedtime. Combines this with the 2 mg glimepiride    . glucose blood (PRECISION QID TEST) test strip 1 each (1 strip total) 2 (two) times daily Use as instructed.    [name] letrozole (FEMARA) 2.5 MG tablet Take 1  tablet (2.5 mg total) by mouth daily. 90 tablet 3  . levothyroxine (SYNTHROID) 125 MCG tablet Take 125 mcg by mouth daily before breakfast.     . losartan (COZAAR) 100 MG tablet Take 100 mg by mouth every morning.     . metFORMIN (GLUCOPHAGE-XR) 500 MG 24 hr tablet Take 1,000 mg by mouth 2 (two) times daily.     . metoprolol succinate (TOPROL-XL) 25 MG 24 hr tablet Take 25 mg by mouth daily. Unsure of dose/ just started 8/20    . Multiple Vitamin (MULTI-VITAMIN) tablet Take 1 tablet by mouth daily.     [name] VERIO test strip 1 each 2 (two) times daily.    . pioglitazone (ACTOS) 15 MG tablet Take by mouth.    . simvastatin (ZOCOR) 40 MG tablet Take 40 mg by mouth daily at 6 PM.     . anastrozole (ARIMIDEX) 1 MG tablet Take 1 tablet (1 mg total) by mouth daily. (Patient not taking: Reported on 08/05/2019) 30 tablet 1  . ibuprofen (ADVIL) 800 MG tablet Take 1 tablet (800 mg total) by mouth every 8 (eight) hours as needed for mild pain or moderate pain. (Patient not taking: Reported on 08/05/2019) 30 tablet 0   No current facility-administered medications for this visit.    Review of Systems  Constitutional: Negative for chills, diaphoresis, fever, malaise/fatigue and weight loss (up 5 lbs).       Feels "good".  HENT: Negative for congestion, ear discharge, ear pain, hearing loss, nosebleeds, sinus pain, sore throat and tinnitus.   Eyes: Negative for blurred vision.  Respiratory: Negative for cough, hemoptysis, sputum production and shortness of breath.   Cardiovascular: Positive for leg swelling (feet and ankles). Negative for chest pain, palpitations and PND.  Gastrointestinal: Negative for abdominal pain, blood in stool, constipation, diarrhea, heartburn, melena, nausea and vomiting.  Genitourinary: Negative for dysuria, frequency, hematuria and urgency.  Musculoskeletal: Negative for back pain, joint pain, myalgias and neck pain.  Skin: Negative for itching and rash.  Neurological:  Negative for dizziness, tingling, sensory change and headaches.  Endo/Heme/Allergies: Does not bruise/bleed easily.  Psychiatric/Behavioral: Negative for depression and memory loss. The patient is not nervous/anxious and does not have insomnia.   All other systems reviewed and are negative.  Performance status (ECOG): 0  Vitals Blood pressure (!) 158/75, pulse 68, temperature (!) 97.3 F (36.3 C), resp. rate 20, weight 202 lb 14.9 oz (92 kg), SpO2 100 %.   Physical Exam Vitals and nursing note reviewed.  Constitutional:      General: She is not in acute distress.    Appearance: She is well-developed. She is not diaphoretic.     Interventions: Face mask in place.  HENT:     Head: Normocephalic and atraumatic.     Comments:  Short black hair.    Mouth/Throat:     Mouth: Mucous membranes are moist.     Pharynx: Oropharynx is clear. No oropharyngeal exudate.  Eyes:     General: No scleral icterus.    Conjunctiva/sclera: Conjunctivae normal.     Pupils: Pupils are equal, round, and reactive to light.     Comments: Glasses. Brown eyes.  Cardiovascular:     Rate and Rhythm: Normal rate and regular rhythm.     Heart sounds: Normal heart sounds. No murmur heard.   Pulmonary:     Effort: Pulmonary effort is normal. No respiratory distress.     Breath sounds: Normal breath sounds. No wheezing or rales.  Chest:     Chest wall: No tenderness.     Breasts:        Right: No inverted nipple, mass, nipple discharge, skin change or tenderness.        Left: Skin change (area of firmness at 2 o clock, 4 cm from the nipple, possible radiation scarring) present. No inverted nipple, nipple discharge or tenderness.  Abdominal:     General: Bowel sounds are normal. There is no distension.     Palpations: Abdomen is soft. There is no mass.     Tenderness: There is no abdominal tenderness. There is no guarding or rebound.  Musculoskeletal:        General: No tenderness. Normal range of motion.      Cervical back: Normal range of motion and neck supple.  Lymphadenopathy:     Head:     Right side of head: No preauricular, posterior auricular or occipital adenopathy.     Left side of head: No preauricular, posterior auricular or occipital adenopathy.     Cervical: No cervical adenopathy.     Upper Body:     Right upper body: No supraclavicular or axillary adenopathy.     Left upper body: No supraclavicular or axillary adenopathy.     Lower Body: No right inguinal adenopathy. No left inguinal adenopathy.  Skin:    General: Skin is warm and dry.  Neurological:     Mental Status: She is alert and oriented to person, place, and time.  Psychiatric:        Behavior: Behavior normal.        Thought Content: Thought content normal.        Judgment: Judgment normal.    Appointment on 04/24/2020  Component Date Value Ref Range Status  . Sodium 04/24/2020 137  135 - 145 mmol/L Final  . Potassium 04/24/2020 4.2  3.5 - 5.1 mmol/L Final  . Chloride 04/24/2020 101  98 - 111 mmol/L Final  . CO2 04/24/2020 27  22 - 32 mmol/L Final  . Glucose, Bld 04/24/2020 125* 70 - 99 mg/dL Final   Glucose reference range applies only to samples taken after fasting for at least 8 hours.  . BUN 04/24/2020 18  8 - 23 mg/dL Final  . Creatinine, Ser 04/24/2020 1.02* 0.44 - 1.00 mg/dL Final  . Calcium 04/24/2020 9.2  8.9 - 10.3 mg/dL Final  . Total Protein 04/24/2020 7.3  6.5 - 8.1 g/dL Final  . Albumin 04/24/2020 3.6  3.5 - 5.0 g/dL Final  . AST 04/24/2020 18  15 - 41 U/L Final  . ALT 04/24/2020 16  0 - 44 U/L Final  . Alkaline Phosphatase 04/24/2020 71  38 - 126 U/L Final  . Total Bilirubin 04/24/2020 0.4  0.3 - 1.2 mg/dL Final  . GFR, Estimated  04/24/2020 59* >60 mL/min Final   Comment: (NOTE) Calculated using the CKD-EPI Creatinine Equation (2021)   . Anion gap 04/24/2020 9  5 - 15 Final   Performed at [facility], [address], [place], [place] [address]  . WBC 04/24/2020 5.3  4.0 -  10.5 K/uL Final  . RBC 04/24/2020 3.93  3.87 - 5.11 MIL/uL Final  . Hemoglobin 04/24/2020 11.2* 12.0 - 15.0 g/dL Final  . HCT 04/24/2020 35.0* 36 - 46 % Final  . MCV 04/24/2020 89.1  80.0 - 100.0 fL Final  . MCH 04/24/2020 28.5  26.0 - 34.0 pg Final  . MCHC 04/24/2020 32.0  30.0 - 36.0 g/dL Final  . RDW 04/24/2020 15.0  11.5 - 15.5 % Final  . Platelets 04/24/2020 213  150 - 400 K/uL Final  . nRBC 04/24/2020 0.0  0.0 - 0.2 % Final  . Neutrophils Relative % 04/24/2020 60  % Final  . Neutro Abs 04/24/2020 3.2  1.7 - 7.7 K/uL Final  . Lymphocytes Relative 04/24/2020 27  % Final  . Lymphs Abs 04/24/2020 1.4  0.7 - 4.0 K/uL Final  . Monocytes Relative 04/24/2020 10  % Final  . Monocytes Absolute 04/24/2020 0.5  0.1 - 1.0 K/uL Final  . Eosinophils Relative 04/24/2020 2  % Final  . Eosinophils Absolute 04/24/2020 0.1  0.0 - 0.5 K/uL Final  . Basophils Relative 04/24/2020 1  % Final  . Basophils Absolute 04/24/2020 0.0  0.0 - 0.1 K/uL Final  . Immature Granulocytes 04/24/2020 0  % Final  . Abs Immature Granulocytes 04/24/2020 0.02  0.00 - 0.07 K/uL Final   Performed at [facility], [address] [place], [place] [address]    Assessment:  Caitlin Ford is a 69 y.o. female withstage IAleft breast cancers/plumpectomy and axillary lymph node biopsy on 01/14/2019. 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 Two sentinel lymph nodes negative for malignancy.Tumor wasER+>90%, PR +>90%, andHER-2/neu negative (1+).Pathologicstagewas pT1b pN0.  Bilateral screening mammogramon 11/25/2018 revealed a possible distortion in the left breast. Bilateral diagnostic mammogramon 12/02/2018 revealed a persistent left breast distortion in the upper inner quadrant, middle depth.  Bilateral diagnostic mammogram on 11/29/2019 revealed expected postsurgical changes in the left breast. There were no mammographic evidence of  malignancy bilaterally.  Sheunderwentbreastradiationfrom09/23/2020 - 03/21/2019.She began [facility] 03/31/2019.  CA27.29 has been followed:15 on07/31/2020,15.2 on 03/14/2019, 16.9 on 03/31/2019, 22.6 on 08/05/2019, 14.1 on 12/12/2019, and 12.3 on 04/24/2020.  Bone densityon 10/21/2018 was normalwith a T-score of +0.3 in the left femoral neck and + 1.3 in AP spine L1-L4.  She has iron deficiency anemia.  Ferritin was 13 on 04/24/2020.    She received her second COVID-19 vaccine in 06/2019. She received the [employer] shot on 03/15/2020.  Symptomatically, she feels good.  She denies any breast concerns.  Exam reveals apparent scarring in the left breast.  Plan: 1.   Labs today: CBC with diff, CMP, CA 27-29, ferritin, iron studies, B12, folate. 2.Clinical stage I left breast cancer She is s/p lumpectomy and SLN biopsy (01/14/2019). She is s/p breast radiation (completed 03/21/2019). She began Femara on 03/31/2019.  Clinically, she is doing well.  Exam reveals significant firmness/scarring at the 2 o'clock position in the left breast.  Bilateral mammogram on 11/29/2019 revealed no evidence of recurrence.   Discussed diagnostic left mammogram and ultrasound.  Continue. 3.Mild normocytic anemia  Hematocrit 34.2.  Hemoglobin 10.8.  MCV 88.6 on 12/12/2019.  Hematocrit 35.0.  Hemoglobin 11.2.  MCV 89.1 on 04/24/2020.  Diet is good.  She denies any bleeding.  Creatinine is 1.02.  Ferritin is 13 (low) with an iron saturation of 9%(low) and a TIBC of 350 today.  B12 is 482 and folate 18.2 today. 4.   Diagnostic left mammogram and ultrasound on 05/01/2020. 5.   RN or MD to call patient with results. 6.   RTC in 4 months for MD assessment and labs (CBC with diff, CMP, CA27.29).  Addendum:   Patient was contacted after clinic once her iron studies became available.  She is to begin ferrous sulfate 325 mg p.o. daily with orange juice or  vitamin C.  If tolerating well, she will increase her iron to twice daily.  We discussed referral to [place] [facility] for evaluation.  I discussed the assessment and treatment plan with the patient.  The patient was provided an opportunity to ask questions and all were answered.  The patient agreed with the plan and demonstrated an understanding of the instructions.  The patient was advised to call back if the symptoms worsen or if the condition fails to improve as anticipated.   Lequita Asal, MD, PhD    04/24/2020, 11:13 AM  I, Mirian Mo Tufford, am acting as Education administrator for [employer]. Mike Gip, MD, PhD.  I, Maziah Keeling C. Mike Gip, MD, have reviewed the above documentation for accuracy and completeness, and I agree with the above.

## 2020-04-24 ENCOUNTER — Other Ambulatory Visit: Payer: Self-pay

## 2020-04-24 ENCOUNTER — Encounter: Payer: Self-pay | Admitting: Hematology and Oncology

## 2020-04-24 ENCOUNTER — Inpatient Hospital Stay (HOSPITAL_BASED_OUTPATIENT_CLINIC_OR_DEPARTMENT_OTHER): Payer: Medicare HMO | Admitting: Hematology and Oncology

## 2020-04-24 ENCOUNTER — Inpatient Hospital Stay: Payer: Medicare HMO | Attending: Hematology and Oncology

## 2020-04-24 VITALS — BP 158/75 | HR 68 | Temp 97.3°F | Resp 20 | Wt 202.9 lb

## 2020-04-24 DIAGNOSIS — Z170 Estrogen receptor positive status [ER+]: Secondary | ICD-10-CM

## 2020-04-24 DIAGNOSIS — C50212 Malignant neoplasm of upper-inner quadrant of left female breast: Secondary | ICD-10-CM | POA: Diagnosis not present

## 2020-04-24 DIAGNOSIS — M1990 Unspecified osteoarthritis, unspecified site: Secondary | ICD-10-CM | POA: Insufficient documentation

## 2020-04-24 DIAGNOSIS — D509 Iron deficiency anemia, unspecified: Secondary | ICD-10-CM | POA: Diagnosis not present

## 2020-04-24 DIAGNOSIS — E7800 Pure hypercholesterolemia, unspecified: Secondary | ICD-10-CM | POA: Diagnosis not present

## 2020-04-24 DIAGNOSIS — I10 Essential (primary) hypertension: Secondary | ICD-10-CM | POA: Diagnosis not present

## 2020-04-24 DIAGNOSIS — K219 Gastro-esophageal reflux disease without esophagitis: Secondary | ICD-10-CM | POA: Insufficient documentation

## 2020-04-24 DIAGNOSIS — E119 Type 2 diabetes mellitus without complications: Secondary | ICD-10-CM | POA: Diagnosis not present

## 2020-04-24 DIAGNOSIS — E039 Hypothyroidism, unspecified: Secondary | ICD-10-CM | POA: Diagnosis not present

## 2020-04-24 DIAGNOSIS — Z79811 Long term (current) use of aromatase inhibitors: Secondary | ICD-10-CM | POA: Diagnosis not present

## 2020-04-24 DIAGNOSIS — D649 Anemia, unspecified: Secondary | ICD-10-CM

## 2020-04-24 DIAGNOSIS — Z7984 Long term (current) use of oral hypoglycemic drugs: Secondary | ICD-10-CM | POA: Diagnosis not present

## 2020-04-24 DIAGNOSIS — Z7982 Long term (current) use of aspirin: Secondary | ICD-10-CM | POA: Insufficient documentation

## 2020-04-24 DIAGNOSIS — Z923 Personal history of irradiation: Secondary | ICD-10-CM | POA: Diagnosis not present

## 2020-04-24 DIAGNOSIS — D631 Anemia in chronic kidney disease: Secondary | ICD-10-CM | POA: Insufficient documentation

## 2020-04-24 DIAGNOSIS — C50919 Malignant neoplasm of unspecified site of unspecified female breast: Secondary | ICD-10-CM

## 2020-04-24 DIAGNOSIS — Z853 Personal history of malignant neoplasm of breast: Secondary | ICD-10-CM

## 2020-04-24 DIAGNOSIS — Z79899 Other long term (current) drug therapy: Secondary | ICD-10-CM | POA: Insufficient documentation

## 2020-04-24 LAB — FERRITIN: Ferritin: 13 ng/mL (ref 11–307)

## 2020-04-24 LAB — VITAMIN B12: Vitamin B-12: 482 pg/mL (ref 180–914)

## 2020-04-24 LAB — COMPREHENSIVE METABOLIC PANEL
ALT: 16 U/L (ref 0–44)
AST: 18 U/L (ref 15–41)
Albumin: 3.6 g/dL (ref 3.5–5.0)
Alkaline Phosphatase: 71 U/L (ref 38–126)
Anion gap: 9 (ref 5–15)
BUN: 18 mg/dL (ref 8–23)
CO2: 27 mmol/L (ref 22–32)
Calcium: 9.2 mg/dL (ref 8.9–10.3)
Chloride: 101 mmol/L (ref 98–111)
Creatinine, Ser: 1.02 mg/dL — ABNORMAL HIGH (ref 0.44–1.00)
GFR, Estimated: 59 mL/min — ABNORMAL LOW (ref >60)
Glucose, Bld: 125 mg/dL — ABNORMAL HIGH (ref 70–99)
Potassium: 4.2 mmol/L (ref 3.5–5.1)
Sodium: 137 mmol/L (ref 135–145)
Total Bilirubin: 0.4 mg/dL (ref 0.3–1.2)
Total Protein: 7.3 g/dL (ref 6.5–8.1)

## 2020-04-24 LAB — CBC WITH DIFFERENTIAL/PLATELET
Abs Immature Granulocytes: 0.02 10*3/uL (ref 0.00–0.07)
Basophils Absolute: 0 10*3/uL (ref 0.0–0.1)
Basophils Relative: 1 %
Eosinophils Absolute: 0.1 10*3/uL (ref 0.0–0.5)
Eosinophils Relative: 2 %
HCT: 35 % — ABNORMAL LOW (ref 36.0–46.0)
Hemoglobin: 11.2 g/dL — ABNORMAL LOW (ref 12.0–15.0)
Immature Granulocytes: 0 %
Lymphocytes Relative: 27 %
Lymphs Abs: 1.4 10*3/uL (ref 0.7–4.0)
MCH: 28.5 pg (ref 26.0–34.0)
MCHC: 32 g/dL (ref 30.0–36.0)
MCV: 89.1 fL (ref 80.0–100.0)
Monocytes Absolute: 0.5 10*3/uL (ref 0.1–1.0)
Monocytes Relative: 10 %
Neutro Abs: 3.2 10*3/uL (ref 1.7–7.7)
Neutrophils Relative %: 60 %
Platelets: 213 10*3/uL (ref 150–400)
RBC: 3.93 MIL/uL (ref 3.87–5.11)
RDW: 15 % (ref 11.5–15.5)
WBC: 5.3 10*3/uL (ref 4.0–10.5)
nRBC: 0 % (ref 0.0–0.2)

## 2020-04-24 LAB — IRON AND TIBC
Iron: 32 ug/dL (ref 28–170)
Saturation Ratios: 9 % — ABNORMAL LOW (ref 10.4–31.8)
TIBC: 350 ug/dL (ref 250–450)
UIBC: 318 ug/dL

## 2020-04-24 LAB — FOLATE: Folate: 18.2 ng/mL (ref >5.9)

## 2020-04-25 ENCOUNTER — Telehealth: Payer: Self-pay

## 2020-04-25 ENCOUNTER — Other Ambulatory Visit: Payer: Self-pay

## 2020-04-25 DIAGNOSIS — Z170 Estrogen receptor positive status [ER+]: Secondary | ICD-10-CM

## 2020-04-25 DIAGNOSIS — D649 Anemia, unspecified: Secondary | ICD-10-CM

## 2020-04-25 DIAGNOSIS — C50212 Malignant neoplasm of upper-inner quadrant of left female breast: Secondary | ICD-10-CM

## 2020-04-25 LAB — CANCER ANTIGEN 27.29: CA 27.29: 12.3 U/mL (ref 0.0–38.6)

## 2020-04-25 NOTE — Telephone Encounter (Signed)
Patient aware. She is agreeable to GI evaluation. Shirlean Mylar do you set that up?

## 2020-04-25 NOTE — Telephone Encounter (Signed)
-----   Message from Lequita Asal, MD sent at 04/24/2020  5:08 PM EST ----- Regarding: Please call patient  Patient is iron deficient.  Begin ferous sulfate 325 mg po q day with OJ or vitamin C.  If tolerating well, increase to BID.  RTC in 1 month for CBC and ferritin.  She will need a GI evaluation.  M ----- Message ----- From: Buel Ream, [facility] [place] Sent: 04/24/2020   9:43 AM EST To: Lequita Asal, MD

## 2020-04-26 ENCOUNTER — Other Ambulatory Visit: Payer: Self-pay | Admitting: Hematology and Oncology

## 2020-04-26 DIAGNOSIS — D509 Iron deficiency anemia, unspecified: Secondary | ICD-10-CM

## 2020-04-26 NOTE — Telephone Encounter (Signed)
  GI consult to Dr Alice Reichert made.  M

## 2020-04-26 NOTE — Telephone Encounter (Signed)
Hey honey I think a referral has to be put in.

## 2020-04-30 ENCOUNTER — Telehealth: Payer: Self-pay

## 2020-05-02 ENCOUNTER — Ambulatory Visit
Admission: RE | Admit: 2020-05-02 | Discharge: 2020-05-02 | Disposition: A | Discharge status: Home / Self Care | Payer: Medicare HMO | Source: Ambulatory Visit | Attending: Hematology and Oncology | Admitting: Hematology and Oncology

## 2020-05-02 ENCOUNTER — Other Ambulatory Visit: Payer: Self-pay

## 2020-05-02 DIAGNOSIS — C50212 Malignant neoplasm of upper-inner quadrant of left female breast: Secondary | ICD-10-CM | POA: Diagnosis not present

## 2020-05-02 DIAGNOSIS — Z170 Estrogen receptor positive status [ER+]: Secondary | ICD-10-CM | POA: Diagnosis present

## 2020-05-02 HISTORY — DX: Personal history of irradiation: Z92.3

## 2020-05-23 ENCOUNTER — Inpatient Hospital Stay: Payer: Medicare HMO

## 2020-05-28 ENCOUNTER — Other Ambulatory Visit: Payer: Self-pay

## 2020-05-28 ENCOUNTER — Inpatient Hospital Stay: Payer: Medicare HMO | Attending: Hematology and Oncology

## 2020-05-28 DIAGNOSIS — D649 Anemia, unspecified: Secondary | ICD-10-CM

## 2020-05-28 DIAGNOSIS — C50212 Malignant neoplasm of upper-inner quadrant of left female breast: Secondary | ICD-10-CM | POA: Insufficient documentation

## 2020-05-28 DIAGNOSIS — Z170 Estrogen receptor positive status [ER+]: Secondary | ICD-10-CM | POA: Insufficient documentation

## 2020-05-28 DIAGNOSIS — Z79811 Long term (current) use of aromatase inhibitors: Secondary | ICD-10-CM | POA: Diagnosis not present

## 2020-05-28 LAB — CBC WITH DIFFERENTIAL/PLATELET
Abs Immature Granulocytes: 0.01 10*3/uL (ref 0.00–0.07)
Basophils Absolute: 0 10*3/uL (ref 0.0–0.1)
Basophils Relative: 1 %
Eosinophils Absolute: 0.1 10*3/uL (ref 0.0–0.5)
Eosinophils Relative: 2 %
HCT: 35.4 % — ABNORMAL LOW (ref 36.0–46.0)
Hemoglobin: 11.3 g/dL — ABNORMAL LOW (ref 12.0–15.0)
Immature Granulocytes: 0 %
Lymphocytes Relative: 27 %
Lymphs Abs: 1.5 10*3/uL (ref 0.7–4.0)
MCH: 28.5 pg (ref 26.0–34.0)
MCHC: 31.9 g/dL (ref 30.0–36.0)
MCV: 89.2 fL (ref 80.0–100.0)
Monocytes Absolute: 0.5 10*3/uL (ref 0.1–1.0)
Monocytes Relative: 9 %
Neutro Abs: 3.4 10*3/uL (ref 1.7–7.7)
Neutrophils Relative %: 61 %
Platelets: 207 10*3/uL (ref 150–400)
RBC: 3.97 MIL/uL (ref 3.87–5.11)
RDW: 15.4 % (ref 11.5–15.5)
WBC: 5.5 10*3/uL (ref 4.0–10.5)
nRBC: 0 % (ref 0.0–0.2)

## 2020-05-28 LAB — FERRITIN: Ferritin: 18 ng/mL (ref 11–307)

## 2020-05-29 ENCOUNTER — Telehealth: Payer: Self-pay

## 2020-05-29 NOTE — Telephone Encounter (Signed)
I called patient but there was no answer or voicemail.

## 2020-07-19 ENCOUNTER — Other Ambulatory Visit
Admission: RE | Admit: 2020-07-19 | Discharge: 2020-07-19 | Disposition: A | Discharge status: Home / Self Care | Payer: Medicare HMO | Source: Ambulatory Visit | Attending: Gastroenterology | Admitting: Gastroenterology

## 2020-07-19 ENCOUNTER — Other Ambulatory Visit: Payer: Self-pay

## 2020-07-19 DIAGNOSIS — Z20822 Contact with and (suspected) exposure to covid-19: Secondary | ICD-10-CM | POA: Diagnosis not present

## 2020-07-19 DIAGNOSIS — Z01812 Encounter for preprocedural laboratory examination: Secondary | ICD-10-CM | POA: Diagnosis present

## 2020-07-19 LAB — SARS CORONAVIRUS 2 (TAT 6-24 HRS): SARS Coronavirus 2: NEGATIVE

## 2020-07-20 ENCOUNTER — Encounter: Payer: Self-pay | Admitting: *Deleted

## 2020-07-23 ENCOUNTER — Other Ambulatory Visit: Payer: Self-pay

## 2020-07-23 ENCOUNTER — Ambulatory Visit: Payer: Medicare HMO | Admitting: Certified Registered Nurse Anesthetist

## 2020-07-23 ENCOUNTER — Encounter
Admission: RE | Disposition: A | Discharge status: Home / Self Care | Payer: Self-pay | Source: Home / Self Care | Attending: Gastroenterology

## 2020-07-23 ENCOUNTER — Encounter: Payer: Self-pay | Admitting: *Deleted

## 2020-07-23 ENCOUNTER — Ambulatory Visit
Admission: RE | Admit: 2020-07-23 | Discharge: 2020-07-23 | Disposition: A | Discharge status: Home / Self Care | Payer: Medicare HMO | Attending: Gastroenterology | Admitting: Gastroenterology

## 2020-07-23 DIAGNOSIS — Z888 Allergy status to other drugs, medicaments and biological substances status: Secondary | ICD-10-CM | POA: Diagnosis not present

## 2020-07-23 DIAGNOSIS — Z881 Allergy status to other antibiotic agents status: Secondary | ICD-10-CM | POA: Diagnosis not present

## 2020-07-23 DIAGNOSIS — Z79890 Hormone replacement therapy (postmenopausal): Secondary | ICD-10-CM | POA: Insufficient documentation

## 2020-07-23 DIAGNOSIS — K5730 Diverticulosis of large intestine without perforation or abscess without bleeding: Secondary | ICD-10-CM | POA: Diagnosis not present

## 2020-07-23 DIAGNOSIS — Z79899 Other long term (current) drug therapy: Secondary | ICD-10-CM | POA: Insufficient documentation

## 2020-07-23 DIAGNOSIS — Z7984 Long term (current) use of oral hypoglycemic drugs: Secondary | ICD-10-CM | POA: Insufficient documentation

## 2020-07-23 DIAGNOSIS — D509 Iron deficiency anemia, unspecified: Secondary | ICD-10-CM | POA: Insufficient documentation

## 2020-07-23 DIAGNOSIS — Z7982 Long term (current) use of aspirin: Secondary | ICD-10-CM | POA: Insufficient documentation

## 2020-07-23 DIAGNOSIS — K640 First degree hemorrhoids: Secondary | ICD-10-CM | POA: Insufficient documentation

## 2020-07-23 DIAGNOSIS — Z853 Personal history of malignant neoplasm of breast: Secondary | ICD-10-CM | POA: Diagnosis not present

## 2020-07-23 DIAGNOSIS — K2950 Unspecified chronic gastritis without bleeding: Secondary | ICD-10-CM | POA: Diagnosis not present

## 2020-07-23 DIAGNOSIS — Z79811 Long term (current) use of aromatase inhibitors: Secondary | ICD-10-CM | POA: Insufficient documentation

## 2020-07-23 HISTORY — DX: Other cervical disc degeneration, unspecified cervical region: M50.30

## 2020-07-23 HISTORY — DX: Chronic kidney disease, unspecified: N18.9

## 2020-07-23 HISTORY — PX: COLONOSCOPY WITH PROPOFOL: SHX5780

## 2020-07-23 HISTORY — PX: ESOPHAGOGASTRODUODENOSCOPY (EGD) WITH PROPOFOL: SHX5813

## 2020-07-23 LAB — GLUCOSE, CAPILLARY: Glucose-Capillary: 110 mg/dL — ABNORMAL HIGH (ref 70–99)

## 2020-07-23 SURGERY — ESOPHAGOGASTRODUODENOSCOPY (EGD) WITH PROPOFOL
Anesthesia: General

## 2020-07-23 MED ORDER — PROPOFOL 500 MG/50ML IV EMUL
INTRAVENOUS | Status: DC | PRN
  Administered 2020-07-23: 160 ug/kg/min via INTRAVENOUS

## 2020-07-23 MED ORDER — PROPOFOL 500 MG/50ML IV EMUL
INTRAVENOUS | Status: AC
  Filled 2020-07-23: qty 100

## 2020-07-23 MED ORDER — PROPOFOL 10 MG/ML IV BOLUS
INTRAVENOUS | Status: DC | PRN
  Administered 2020-07-23: 20 mg via INTRAVENOUS
  Administered 2020-07-23: 10 mg via INTRAVENOUS
  Administered 2020-07-23: 70 mg via INTRAVENOUS

## 2020-07-23 MED ORDER — LIDOCAINE HCL (CARDIAC) PF 100 MG/5ML IV SOSY
PREFILLED_SYRINGE | INTRAVENOUS | Status: DC | PRN
  Administered 2020-07-23: 100 mg via INTRAVENOUS

## 2020-07-23 MED ORDER — SODIUM CHLORIDE 0.9 % IV SOLN: INTRAVENOUS | Status: DC

## 2020-07-23 NOTE — Op Note (Signed)
[facility] Gastroenterology Patient Name: Caitlin Ford Procedure Date: 07/23/2020 8:58 AM MRN: [id number] Account #: [id number] Date of Birth: [birth year] Admit Type: Outpatient Age: 70 Room: [facility] ENDO ROOM 3 Gender: Female Note Status: Finalized Procedure:             Colonoscopy Indications:           Iron deficiency anemia Providers:             Andrey Farmer MD, MD Referring MD:          Christena Flake. [name], MD (Referring MD) Medicines:             Monitored Anesthesia Care Complications:         No immediate complications. Procedure:             Pre-Anesthesia Assessment:                        - Prior to the procedure, a History and Physical was                         performed, and patient medications and allergies were                         reviewed. The patient is competent. The risks and                         benefits of the procedure and the sedation options and                         risks were discussed with the patient. All questions                         were answered and informed consent was obtained.                         Patient identification and proposed procedure were                         verified by the physician, the nurse, the anesthetist                         and the technician in the endoscopy suite. Mental                         Status Examination: alert and oriented. Airway                         Examination: normal oropharyngeal airway and neck                         mobility. Respiratory Examination: clear to                         auscultation. CV Examination: normal. Prophylactic                         Antibiotics: The patient does not require prophylactic  antibiotics. Prior Anticoagulants: The patient has                         taken no previous anticoagulant or antiplatelet                         agents. ASA Grade Assessment: II - A patient with mild                         systemic  disease. After reviewing the risks and                         benefits, the patient was deemed in satisfactory                         condition to undergo the procedure. The anesthesia                         plan was to use monitored anesthesia care (MAC).                         Immediately prior to administration of medications,                         the patient was re-assessed for adequacy to receive                         sedatives. The heart rate, respiratory rate, oxygen                         saturations, blood pressure, adequacy of pulmonary                         ventilation, and response to care were monitored                         throughout the procedure. The physical status of the                         patient was re-assessed after the procedure.                        After obtaining informed consent, the colonoscope was                         passed under direct vision. Throughout the procedure,                         the patient's blood pressure, pulse, and oxygen                         saturations were monitored continuously. The                         Colonoscope was introduced through the anus and                         advanced to the the terminal ileum. The colonoscopy  was performed without difficulty. The patient                         tolerated the procedure well. The quality of the bowel                         preparation was adequate to identify polyps. Findings:      The perianal and digital rectal examinations were normal.      The terminal ileum appeared normal.      A few small-mouthed diverticula were found in the sigmoid colon,       descending colon and ascending colon.      Non-bleeding internal hemorrhoids were found during retroflexion. The       hemorrhoids were Grade I (internal hemorrhoids that do not prolapse).      The exam was otherwise without abnormality on direct and retroflexion        views. Impression:            - The examined portion of the ileum was normal.                        - Diverticulosis in the sigmoid colon, in the                         descending colon and in the ascending colon.                        - Non-bleeding internal hemorrhoids.                        - The examination was otherwise normal on direct and                         retroflexion views.                        - No specimens collected. Recommendation:        - Discharge patient to home.                        - Resume previous diet.                        - Continue present medications.                        - Repeat colonoscopy in 10 years for screening                         purposes.                        - Return to referring physician as previously                         scheduled. Procedure Code(s):     --- Professional ---                        [phone], Colonoscopy, flexible; diagnostic, including  collection of specimen(s) by brushing or washing, when                         performed (separate procedure) Diagnosis Code(s):     --- Professional ---                        K64.0, First degree hemorrhoids                        D50.9, Iron deficiency anemia, unspecified                        K57.30, Diverticulosis of large intestine without                         perforation or abscess without bleeding CPT copyright 2019 American Medical Association. All rights reserved. The codes documented in this report are preliminary and upon coder review may  be revised to meet current compliance requirements. Andrey Farmer MD, MD 07/23/2020 9:53:08 AM Number of Addenda: 0 Note Initiated On: 07/23/2020 8:58 AM Scope Withdrawal Time: 0 hours 11 minutes 8 seconds  Total Procedure Duration: 0 hours 15 minutes 50 seconds  Estimated Blood Loss:  Estimated blood loss: none.      [facility]

## 2020-07-23 NOTE — Anesthesia Postprocedure Evaluation (Signed)
Anesthesia Post Note  Patient: Caitlin Ford  Procedure(s) Performed: ESOPHAGOGASTRODUODENOSCOPY (EGD) WITH PROPOFOL (N/A ) COLONOSCOPY WITH PROPOFOL (N/A )  Patient location during evaluation: Endoscopy Anesthesia Type: General Level of consciousness: awake and alert Pain management: pain level controlled Vital Signs Assessment: post-procedure vital signs reviewed and stable Respiratory status: spontaneous breathing, nonlabored ventilation, respiratory function stable and patient connected to nasal cannula oxygen Cardiovascular status: blood pressure returned to baseline and stable Postop Assessment: no apparent nausea or vomiting Anesthetic complications: no   No complications documented.   Last Vitals:  Vitals:   07/23/20 0950 07/23/20 1000  BP: (!) 152/68 (!) 171/80  Pulse: 92 83  Resp: 16 18  Temp: (!) 36 C   SpO2: 100% 100%    Last Pain:  Vitals:   07/23/20 0842  TempSrc: Temporal  PainSc: 0-No pain                 Arita Miss

## 2020-07-23 NOTE — Op Note (Signed)
[facility]enter[facility] Patient Name: Caitlin Ford Procedure Date: 07/23/2020 8:57 AM MRN: [id number] Account #: [id number] Date of Birth: 09-09-1950 Admit Type: Outpatient Age: 70 Room: [facility] ENDO ROOM 3 Gender: Female Note Status: Finalized Procedure:             Upper GI endoscopy Indications:           Iron deficiency anemia Providers:             Andrey Farmer MD, MD Referring MD:          Christena Flake. [name], MD (Referring MD) Medicines:             Monitored Anesthesia Care Complications:         No immediate complications. Estimated blood loss:                         Minimal. Procedure:             Pre-Anesthesia Assessment:                        - Prior to the procedure, a History and Physical was                         performed, and patient medications and allergies were                         reviewed. The patient is competent. The risks and                         benefits of the procedure and the sedation options and                         risks were discussed with the patient. All questions                         were answered and informed consent was obtained.                         Patient identification and proposed procedure were                         verified by the physician, the nurse, the anesthetist                         and the technician in the endoscopy suite. Mental                         Status Examination: alert and oriented. Airway                         Examination: normal oropharyngeal airway and neck                         mobility. Respiratory Examination: clear to                         auscultation. CV Examination: normal. Prophylactic  Antibiotics: The patient does not require prophylactic                         antibiotics. Prior Anticoagulants: The patient has                         taken no previous anticoagulant or antiplatelet                         agents. ASA Grade Assessment:  II - A patient with mild                         systemic disease. After reviewing the risks and                         benefits, the patient was deemed in satisfactory                         condition to undergo the procedure. The anesthesia                         plan was to use monitored anesthesia care (MAC).                         Immediately prior to administration of medications,                         the patient was re-assessed for adequacy to receive                         sedatives. The heart rate, respiratory rate, oxygen                         saturations, blood pressure, adequacy of pulmonary                         ventilation, and response to care were monitored                         throughout the procedure. The physical status of the                         patient was re-assessed after the procedure.                        After obtaining informed consent, the endoscope was                         passed under direct vision. Throughout the procedure,                         the patient's blood pressure, pulse, and oxygen                         saturations were monitored continuously. The Endoscope                         was introduced through the mouth, and advanced to the  second part of duodenum. The upper GI endoscopy was                         accomplished without difficulty. The patient tolerated                         the procedure well. Findings:      The examined esophagus was normal.      A few localized small erosions with no stigmata of recent bleeding were       found in the gastric antrum. Biopsies were taken with a cold forceps for       Helicobacter pylori testing. Estimated blood loss was minimal.      The examined duodenum was normal. Impression:            - Normal esophagus.                        - Erosive gastropathy with no stigmata of recent                         bleeding. Biopsied.                        -  Normal examined duodenum. Recommendation:        - Perform a colonoscopy today. Procedure Code(s):     --- Professional ---                        [phone], Esophagogastroduodenoscopy, flexible,                         transoral; with biopsy, single or multiple Diagnosis Code(s):     --- Professional ---                        K31.89, Other diseases of stomach and duodenum                        D50.9, Iron deficiency anemia, unspecified CPT copyright 2019 [employer]. All rights reserved. The codes documented in this report are preliminary and upon coder review may  be revised to meet current compliance requirements. Andrey Farmer MD, MD 07/23/2020 9:50:56 AM Number of Addenda: 0 Note Initiated On: 07/23/2020 8:57 AM Estimated Blood Loss:  Estimated blood loss was minimal.      [facility]

## 2020-07-23 NOTE — Interval H&P Note (Signed)
History and Physical Interval Note:  07/23/2020 9:09 AM  Caitlin Ford  has presented today for surgery, with the diagnosis of IDA.  The various methods of treatment have been discussed with the patient and family. After consideration of risks, benefits and other options for treatment, the patient has consented to  Procedure(s): ESOPHAGOGASTRODUODENOSCOPY (EGD) WITH PROPOFOL (N/A) COLONOSCOPY WITH PROPOFOL (N/A) as a surgical intervention.  The patient's history has been reviewed, patient examined, no change in status, stable for surgery.  I have reviewed the patient's chart and labs.  Questions were answered to the patient's satisfaction.     Lesly Rubenstein  Ok to proceed with EGD/Colonoscopy

## 2020-07-23 NOTE — H&P (Signed)
Outpatient short stay form Pre-procedure 07/23/2020 9:07 AM Caitlin Miyamoto MD, MPH  Primary Physician: Dr. Raechel Ford  Reason for visit:  IDA  History of present illness:   70 y/o lady with history of breast cancer here for EGD/Colonoscopy due to [place]. No blood thinners. Hx of hysterectomy. No family history of GI malignancies. No new GI symptoms.    Current Facility-Administered Medications:  .  0.9 %  sodium chloride infusion, , Intravenous, Continuous, [place], Hilton Cork, MD, Last Rate: 20 mL/hr at 07/23/20 0902, New Bag at 07/23/20 0902  Medications Prior to Admission  Medication Sig Dispense Refill Last Dose  . aspirin EC 81 MG tablet Take 81 mg by mouth daily.    Past Week at Unknown time  . cetirizine (ZYRTEC) 10 MG tablet Take 10 mg by mouth as needed.    Past Week at Unknown time  . glimepiride (AMARYL) 4 MG tablet Take 4 mg by mouth in the morning and at bedtime. Combines this with the 2 mg glimepiride   Past Week at Unknown time  . letrozole (FEMARA) 2.5 MG tablet Take 1 tablet (2.5 mg total) by mouth daily. 90 tablet 3 Past Week at Unknown time  . losartan (COZAAR) 100 MG tablet Take 100 mg by mouth every morning.    Past Week at Unknown time  . metoprolol succinate (TOPROL-XL) 25 MG 24 hr tablet Take 25 mg by mouth daily. Unsure of dose/ just started 8/20   Past Week at Unknown time  . Multiple Vitamin (MULTI-VITAMIN) tablet Take 1 tablet by mouth daily.    Past Week at Unknown time  . pioglitazone (ACTOS) 15 MG tablet Take by mouth.   Past Week at Unknown time  . simvastatin (ZOCOR) 40 MG tablet Take 40 mg by mouth daily at 6 PM.    Past Week at Unknown time  . anastrozole (ARIMIDEX) 1 MG tablet Take 1 tablet (1 mg total) by mouth daily. (Patient not taking: Reported on 08/05/2019) 30 tablet 1   . Cysteamine Bitartrate (PROCYSBI) 300 MG PACK See admin instructions. (Patient not taking: Reported on 07/23/2020)   Not Taking at Unknown time  . fluticasone (FLONASE) 50 MCG/ACT nasal  spray Place 1 spray into both nostrils as needed.      [name] glucose blood (PRECISION QID TEST) test strip 1 each (1 strip total) 2 (two) times daily Use as instructed.     [name] ibuprofen (ADVIL) 800 MG tablet Take 1 tablet (800 mg total) by mouth every 8 (eight) hours as needed for mild pain or moderate pain. (Patient not taking: Reported on 08/05/2019) 30 tablet 0   . levothyroxine (SYNTHROID) 125 MCG tablet Take 125 mcg by mouth daily before breakfast.      . metFORMIN (GLUCOPHAGE-XR) 500 MG 24 hr tablet Take 1,000 mg by mouth 2 (two) times daily.      [name] VERIO test strip 1 each 2 (two) times daily.        Allergies  Allergen Reactions  . Altace [Ramipril] Other (See Comments)    Angioedema  . Avandia [Rosiglitazone] Other (See Comments)     Muscle Aches  . Crestor [Rosuvastatin] Other (See Comments)    Muscle Ford  . Doxycycline Other (See Comments)    Muscle Aches  . Lipitor [Atorvastatin] Other (See Comments)    Muscle Ford     Past Medical History:  Diagnosis Date  . Arthritis   . Breast cancer ([place])   . Chronic kidney disease  Chronic Renal Insufficiency - Stage 2  . DDD (degenerative disc disease), cervical   . Diabetes mellitus without complication ([place])   . GERD (gastroesophageal reflux disease)    occ no meds  . Goiter   . Headache    simus  . Hypercholesteremia   . Hypertension   . Hypothyroidism   . Malignant neoplasm of upper-inner quadrant of left breast in female, estrogen receptor positive ([place]) 02/04/2019  . Personal history of radiation therapy   . Proteinuria   . Wears dentures    partial upper, full lower    Review of systems:  Otherwise negative.    Physical Exam  Gen: Alert, oriented. Appears stated age.  HEENT: PERRLA. Lungs: No respiratory distress CV: RRR Abd: soft, benign, no masses Ext: No edema    Planned procedures: Proceed with EGD/colonoscopy. The patient understands the nature of the planned procedure, indications, risks,  alternatives and potential complications including but not limited to bleeding, infection, perforation, damage to internal organs and possible oversedation/side effects from anesthesia. The patient agrees and gives consent to proceed.  Please refer to procedure notes for findings, recommendations and patient disposition/instructions.     Caitlin Miyamoto MD, MPH Gastroenterology 07/23/2020  9:07 AM

## 2020-07-23 NOTE — Transfer of Care (Signed)
Immediate Anesthesia Transfer of Care Note  Patient: Caitlin Ford  Procedure(s) Performed: ESOPHAGOGASTRODUODENOSCOPY (EGD) WITH PROPOFOL (N/A ) COLONOSCOPY WITH PROPOFOL (N/A )  Patient Location: PACU  Anesthesia Type:General  Level of Consciousness: awake and alert   Airway & Oxygen Therapy: Patient Spontanous Breathing  Post-op Assessment: Report given to RN and Post -op Vital signs reviewed and stable  Post vital signs: Reviewed and stable  Last Vitals:  Vitals Value Taken Time  BP 152/68 07/23/20 0949  Temp    Pulse 92 07/23/20 0949  Resp 16 07/23/20 0949  SpO2 100 % 07/23/20 0949  Vitals shown include unvalidated device data.  Last Pain:  Vitals:   07/23/20 0842  TempSrc: Temporal  PainSc: 0-No pain         Complications: No complications documented.

## 2020-07-23 NOTE — Anesthesia Preprocedure Evaluation (Signed)
Anesthesia Evaluation  Patient identified by MRN, date of birth, ID band Patient awake    Reviewed: Allergy & Precautions, H&P , NPO status , Patient's Chart, lab work & pertinent test results, reviewed documented beta blocker date and time   History of Anesthesia Complications Negative for: history of anesthetic complications  Airway Mallampati: III  TM Distance: <3 FB Neck ROM: full    Dental  (+) Partial Upper, Lower Dentures   Pulmonary neg pulmonary ROS, neg sleep apnea, neg COPD, Patient abstained from smoking.Not current smoker,    Pulmonary exam normal breath sounds clear to auscultation       Cardiovascular Exercise Tolerance: Good METShypertension, (-) CAD and (-) Past MI Normal cardiovascular exam(-) dysrhythmias  Rhythm:regular Rate:Normal     Neuro/Psych  Headaches, negative psych ROS   GI/Hepatic Neg liver ROS, GERD  ,  Endo/Other  diabetes, Well Controlled, Oral Hypoglycemic AgentsHypothyroidism   Renal/GU Renal InsufficiencyRenal disease  negative genitourinary   Musculoskeletal  (+) Arthritis , Osteoarthritis,    Abdominal   Peds negative pediatric ROS (+)  Hematology negative hematology ROS (+)   Anesthesia Other Findings Past Medical History:   Hypothyroidism                                               Diabetes mellitus without complication                       Hypertension                                                 Hypercholesteremia                                           Wears dentures                                                 Comment:partial upper, full lower   Headache                                                       Comment:simus   Goiter                                                       Proteinuria                                                  Reproductive/Obstetrics negative OB ROS  Anesthesia  Physical  Anesthesia Plan  ASA: III  Anesthesia Plan: General   Post-op Pain Management:    Induction: Intravenous  PONV Risk Score and Plan: 3 and Ondansetron, Propofol infusion and TIVA  Airway Management Planned: Natural Airway  Additional Equipment: None  Intra-op Plan:   Post-operative Plan: Extubation in OR  Informed Consent: I have reviewed the patients History and Physical, chart, labs and discussed the procedure including the risks, benefits and alternatives for the proposed anesthesia with the patient or authorized representative who has indicated his/her understanding and acceptance.     Dental Advisory Given  Plan Discussed with: Anesthesiologist, CRNA and Surgeon  Anesthesia Plan Comments: (Discussed risks of anesthesia with patient, including possibility of difficulty with spontaneous ventilation under anesthesia necessitating airway intervention, PONV, and rare risks such as cardiac or respiratory or neurological events. Patient understands.)        Anesthesia Quick Evaluation

## 2020-07-24 LAB — SURGICAL PATHOLOGY

## 2020-07-26 ENCOUNTER — Encounter: Payer: Self-pay | Admitting: Gastroenterology

## 2020-07-31 ENCOUNTER — Other Ambulatory Visit: Payer: Self-pay | Admitting: Hematology and Oncology

## 2020-07-31 DIAGNOSIS — Z170 Estrogen receptor positive status [ER+]: Secondary | ICD-10-CM

## 2020-07-31 DIAGNOSIS — C50212 Malignant neoplasm of upper-inner quadrant of left female breast: Secondary | ICD-10-CM

## 2020-08-21 ENCOUNTER — Other Ambulatory Visit: Payer: Self-pay | Admitting: Hematology and Oncology

## 2020-08-21 DIAGNOSIS — D649 Anemia, unspecified: Secondary | ICD-10-CM

## 2020-08-21 NOTE — Progress Notes (Incomplete)
[facility]  [address] [place], [place] [address] Phone: [phone]  Fax: [phone]   Clinic Day:  08/21/2020  Referring physician: Ezequiel Kayser, MD  Chief Complaint: Caitlin Ford is a 70 y.o. female with stage I left breast canceron Femara who is seen for 4 month assessment.   HPI: The patient was last seen in the medical oncology clinic on 04/24/2020. At that time, she felt good.  She denied any breast concerns.  Exam revealed apparent scarring in the left breast. Hematocrit was 35.0, hemoglobin 11.2, MCV 89.1, platelets 213,000, WBC 5,300. Creatinine was 1.02 (CrCl 59 ml/min). Ferritin was 13 with an iron saturation of 9% and a TIBC of 350. Vitamin B12 was 482 and folate 18.2. CA27.29 was 12.3. She continued Femara. She was to begin oral iron.  Left diagnostic mammogram on 05/02/2020 revealed no suspicious mammographic or sonographic findings corresponding with the patient's left breast physician palpated lump.  The patient saw Stephens November, NP on 06/25/2020 for new patient assessment. She reported heartburn. She was on oral iron.  EGD on 07/23/2020 by Dr. Haig Prophet showed erosive gastropathy with no stigmata of recent bleeding. Colonoscopy showed diverticulosis in the sigmoid colon, descending colon and ascending colon. There were non-bleeding internal hemorrhoids.  Labs on 05/28/2020 revealed a hematocrit of 35.4, hemoglobin 11.3, MCV 89.2, platelets 207,000, WBC 5,500. Ferritin was 18.  During the interim, ***   Past Medical History:  Diagnosis Date  . Arthritis   . Breast cancer ([place])   . Chronic kidney disease    Chronic Renal Insufficiency - Stage 2  . DDD (degenerative disc disease), cervical   . Diabetes mellitus without complication ([place])   . GERD (gastroesophageal reflux disease)    occ no meds  . Goiter   . Headache    simus  . Hypercholesteremia   . Hypertension   . Hypothyroidism   . Malignant neoplasm of  upper-inner quadrant of left breast in female, estrogen receptor positive ([place]) 02/04/2019  . Personal history of radiation therapy   . Proteinuria   . Wears dentures    partial upper, full lower    Past Surgical History:  Procedure Laterality Date  . ABDOMINAL HYSTERECTOMY    . BREAST BIOPSY Left 12/14/2018   [facility]  . BREAST LUMPECTOMY Left 01/14/2019   [place]  . COLONOSCOPY WITH PROPOFOL N/A 02/09/2015   Procedure: COLONOSCOPY WITH PROPOFOL;  Surgeon: Hulen Luster, MD;  Location: [facility] ENDOSCOPY;  Service: Gastroenterology;  Laterality: N/A;  . COLONOSCOPY WITH PROPOFOL N/A 07/23/2020   Procedure: COLONOSCOPY WITH PROPOFOL;  Surgeon: Lesly Rubenstein, MD;  Location: [facility] ENDOSCOPY;  Service: Endoscopy;  Laterality: N/A;  . ESOPHAGOGASTRODUODENOSCOPY (EGD) WITH PROPOFOL N/A 07/23/2020   Procedure: ESOPHAGOGASTRODUODENOSCOPY (EGD) WITH PROPOFOL;  Surgeon: Lesly Rubenstein, MD;  Location: [facility] ENDOSCOPY;  Service: Endoscopy;  Laterality: N/A;  . PARTIAL MASTECTOMY WITH NEEDLE LOCALIZATION AND AXILLARY SENTINEL LYMPH NODE BX Left 01/14/2019   Procedure: PARTIAL MASTECTOMY WITH NEEDLE LOCALIZATION AND AXILLARY SENTINEL LYMPH NODE BX;  Surgeon: Benjamine Sprague, DO;  Location: [facility] ORS;  Service: General;  Laterality: Left;  . THYROIDECTOMY      Family History  Problem Relation Age of Onset  . Diabetes Mother   . Diabetes Maternal Aunt   . Diabetes Maternal Uncle   . Breast cancer Neg Hx     Social History:  reports that she has never smoked. She has never used smokeless tobacco. She reports that she does not drink alcohol  and does not use drugs. 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 She has two children. She lives in [place]. The patient is alone*** today.  Allergies:  Allergies  Allergen Reactions  . Altace [Ramipril] Other (See Comments)    Angioedema  . Avandia [Rosiglitazone] Other (See Comments)     Muscle Aches  . Crestor [Rosuvastatin]  Other (See Comments)    Muscle ache  . Doxycycline Other (See Comments)    Muscle Aches  . Lipitor [Atorvastatin] Other (See Comments)    Muscle ache    Current Medications: Current Outpatient Medications  Medication Sig Dispense Refill  . aspirin EC 81 MG tablet Take 81 mg by mouth daily.     . cetirizine (ZYRTEC) 10 MG tablet Take 10 mg by mouth as needed.     . Cysteamine Bitartrate (PROCYSBI) 300 MG PACK See admin instructions. (Patient not taking: Reported on 07/23/2020)    . fluticasone (FLONASE) 50 MCG/ACT nasal spray Place 1 spray into both nostrils as needed.     [name] glimepiride (AMARYL) 4 MG tablet Take 4 mg by mouth in the morning and at bedtime. Combines this with the 2 mg glimepiride    . glucose blood (PRECISION QID TEST) test strip 1 each (1 strip total) 2 (two) times daily Use as instructed.    [name] ibuprofen (ADVIL) 800 MG tablet Take 1 tablet (800 mg total) by mouth every 8 (eight) hours as needed for mild pain or moderate pain. (Patient not taking: Reported on 08/05/2019) 30 tablet 0  . letrozole (FEMARA) 2.5 MG tablet TAKE 1 TABLET BY MOUTH EVERY DAY 90 tablet 0  . levothyroxine (SYNTHROID) 125 MCG tablet Take 125 mcg by mouth daily before breakfast.     . losartan (COZAAR) 100 MG tablet Take 100 mg by mouth every morning.     . metFORMIN (GLUCOPHAGE-XR) 500 MG 24 hr tablet Take 1,000 mg by mouth 2 (two) times daily.     . metoprolol succinate (TOPROL-XL) 25 MG 24 hr tablet Take 25 mg by mouth daily. Unsure of dose/ just started 8/20    . Multiple Vitamin (MULTI-VITAMIN) tablet Take 1 tablet by mouth daily.     [name] VERIO test strip 1 each 2 (two) times daily.    . pioglitazone (ACTOS) 15 MG tablet Take by mouth.    . simvastatin (ZOCOR) 40 MG tablet Take 40 mg by mouth daily at 6 PM.      No current facility-administered medications for this visit.    Review of Systems  Constitutional: Negative for chills, diaphoresis, fever, malaise/fatigue and weight loss (up 5  lbs).       Feels "good".  HENT: Negative for congestion, ear discharge, ear pain, hearing loss, nosebleeds, sinus pain, sore throat and tinnitus.   Eyes: Negative for blurred vision.  Respiratory: Negative for cough, hemoptysis, sputum production and shortness of breath.   Cardiovascular: Positive for leg swelling (feet and ankles). Negative for chest pain, palpitations and PND.  Gastrointestinal: Negative for abdominal pain, blood in stool, constipation, diarrhea, heartburn, melena, nausea and vomiting.  Genitourinary: Negative for dysuria, frequency, hematuria and urgency.  Musculoskeletal: Negative for back pain, joint pain, myalgias and neck pain.  Skin: Negative for itching and rash.  Neurological: Negative for dizziness, tingling, sensory change and headaches.  Endo/Heme/Allergies: Does not bruise/bleed easily.  Psychiatric/Behavioral: Negative for depression and memory loss. The patient is not nervous/anxious and does not have insomnia.   All other  systems reviewed and are negative.  Performance status (ECOG): 0***  Vitals There were no vitals taken for this visit.   Physical Exam Vitals and nursing note reviewed.  Constitutional:      General: She is not in acute distress.    Appearance: She is well-developed. She is not diaphoretic.     Interventions: Face mask in place.  HENT:     Head: Normocephalic and atraumatic.     Comments: Short black hair.    Mouth/Throat:     Mouth: Mucous membranes are moist.     Pharynx: Oropharynx is clear. No oropharyngeal exudate.  Eyes:     General: No scleral icterus.    Conjunctiva/sclera: Conjunctivae normal.     Pupils: Pupils are equal, round, and reactive to light.     Comments: Glasses. Brown eyes.  Cardiovascular:     Rate and Rhythm: Normal rate and regular rhythm.     Heart sounds: Normal heart sounds. No murmur heard.   Pulmonary:     Effort: Pulmonary effort is normal. No respiratory distress.     Breath sounds: Normal  breath sounds. No wheezing or rales.  Chest:     Chest wall: No tenderness.  Breasts:     Right: No inverted nipple, mass, nipple discharge, skin change, tenderness, axillary adenopathy or supraclavicular adenopathy.     Left: Skin change (area of firmness at 2 o clock, 4 cm from the nipple, possible radiation scarring) present. No inverted nipple, nipple discharge, tenderness, axillary adenopathy or supraclavicular adenopathy.    Abdominal:     General: Bowel sounds are normal. There is no distension.     Palpations: Abdomen is soft. There is no mass.     Tenderness: There is no abdominal tenderness. There is no guarding or rebound.  Musculoskeletal:        General: No tenderness. Normal range of motion.     Cervical back: Normal range of motion and neck supple.  Lymphadenopathy:     Head:     Right side of head: No preauricular, posterior auricular or occipital adenopathy.     Left side of head: No preauricular, posterior auricular or occipital adenopathy.     Cervical: No cervical adenopathy.     Upper Body:     Right upper body: No supraclavicular or axillary adenopathy.     Left upper body: No supraclavicular or axillary adenopathy.     Lower Body: No right inguinal adenopathy. No left inguinal adenopathy.  Skin:    General: Skin is warm and dry.  Neurological:     Mental Status: She is alert and oriented to person, place, and time.  Psychiatric:        Behavior: Behavior normal.        Thought Content: Thought content normal.        Judgment: Judgment normal.    No visits with results within 3 Day(s) from this visit.  Latest known visit with results is:  Admission on 07/23/2020, Discharged on 07/23/2020  Component Date Value Ref Range Status  . Glucose-Capillary 07/23/2020 110* 70 - 99 mg/dL Final   Glucose reference range applies only to samples taken after fasting for at least 8 hours.  . SURGICAL PATHOLOGY 07/23/2020    Final-Edited                   Value:SURGICAL  PATHOLOGY CASE: [id number] PATIENT: Caitlin Ford Surgical Pathology Report   Specimen Submitted: A. Stomach; cbx  Clinical History: IDA  DIAGNOSIS: A. 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 - GASTRIC OXYNTIC MUCOSA WITH NO SIGNIFICANT HISTOPATHOLOGIC CHANGE. - NEGATIVE FOR H. PYLORI, DYSPLASIA, AND MALIGNANCY.  GROSS DESCRIPTION: A. Labeled: cbx gastric for H. pylori Received: Formalin Collection time: 9:22 AM on 07/23/2020 Placed into formalin time: 9:22 AM on 07/23/2020 Tissue fragment(s): Multiple Size: Aggregate, 1 x 0.5 x 0.2 cm Description: Tan soft tissue fragments Entirely submitted in 1 cassette.  Final Diagnosis performed by Allena Napoleon, MD.   Electronically signed 07/24/2020 11:23:39AM The electronic signature indicates that the named Attending Pathologist has evaluated the specimen Technical component performed at [facility], [address] [place], [place] [address] Lab: [phone] Dir                         : Rush Farmer, MD, MMM  Professional component performed at [facility], [facility], [place], [place], [place] [address] Lab: [phone] Dir: Dellia Nims. Reuel Derby, MD    Assessment:  Kiyo Heal is a 70 y.o. female withstage IAleft breast cancers/plumpectomy and axillary lymph node biopsy on 01/14/2019. 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 Two sentinel lymph nodes negative for malignancy.Tumor wasER+>90%, PR +>90%, andHER-2/neu negative (1+).Pathologicstagewas pT1b pN0.  Bilateral screening mammogramon 11/25/2018 revealed a possible distortion in the left breast. Bilateral diagnostic mammogramon 12/02/2018 revealed a persistent left breast distortion in the upper inner quadrant, middle depth.  Bilateral diagnostic mammogram on 11/29/2019 revealed expected postsurgical changes in the left breast. There were  no mammographic evidence of malignancy bilaterally.  Left diagnostic mammogram on 05/02/2020 revealed no suspicious mammographic or sonographic findings.  Sheunderwentbreastradiationfrom09/23/2020 - 03/21/2019.She began [facility] 03/31/2019.  CA27.29 has been followed:15 on07/31/2020,15.2 on 03/14/2019, 16.9 on 03/31/2019, 22.6 on 08/05/2019, 14.1 on 12/12/2019, and 12.3 on 04/24/2020.  Bone densityon 10/21/2018 was normalwith a T-score of +0.3 in the left femoral neck and + 1.3 in AP spine L1-L4.  She has iron deficiency anemia.  Ferritin was 13 on 04/24/2020 and 18 on 05/28/2020.    EGD on 07/23/2020 revealed erosive gastropathy with no stigmata of recent bleeding. Colonoscopy on 07/23/2020 revealed diverticulosis in the sigmoid colon, descending colon and ascending colon. There were non-bleeding internal hemorrhoids.  She received her second COVID-19 vaccine in 06/2019. She received the [employer] shot on 03/15/2020.  Symptomatically, ***  Plan: 1.   Labs today: CBC with diff, CMP, CA27.29, ferritin.   2.Clinical stage I left breast cancer She is s/p lumpectomy and SLN biopsy (01/14/2019). She is s/p breast radiation (completed 03/21/2019). She began Femara on 03/31/2019.  Clinically, she is doing well.  Exam reveals significant firmness/scarring at the 2 o'clock position in the left breast.  Bilateral mammogram on 11/29/2019 revealed no evidence of recurrence.   Discussed diagnostic left mammogram and ultrasound.  Continue. 3.Iron deficiency anemia  Hematocrit 34.2.  Hemoglobin 10.8.  MCV 88.6 on 12/12/2019.  Hematocrit 35.0.  Hemoglobin 11.2.  MCV 89.1 on 04/24/2020.  Diet is good.  She denies any bleeding.  Creatinine is 1.02.  Ferritin is 13 (low) with an iron saturation of 9%(low) and a TIBC of 350 today.  B12 is 482 and folate 18.2 today. 4.   Diagnostic left mammogram and ultrasound on 05/01/2020. 5.   RN or MD  to call patient with results. 6.   RTC in 4 months for MD assessment and labs (CBC with diff, CMP, CA27.29).  Addendum:   Patient was contacted  after clinic once her iron studies became available.  She is to begin ferrous sulfate 325 mg p.o. daily with orange juice or vitamin C.  If tolerating well, she will increase her iron to twice daily.  We discussed referral to [facility] for evaluation.  I discussed the assessment and treatment plan with the patient.  The patient was provided an opportunity to ask questions and all were answered.  The patient agreed with the plan and demonstrated an understanding of the instructions.  The patient was advised to call back if the symptoms worsen or if the condition fails to improve as anticipated.  I provided *** minutes of face-to-face time during this this encounter and > 50% was spent counseling as documented under my assessment and plan.  Lequita Asal, MD, PhD    08/21/2020, 9:49 AM  I, Mirian Mo Tufford, am acting as Education administrator for [employer]. Mike Gip, MD, PhD.  I, Melissa C. Mike Gip, MD, have reviewed the above documentation for accuracy and completeness, and I agree with the above.

## 2020-08-22 ENCOUNTER — Other Ambulatory Visit: Payer: Medicare HMO

## 2020-08-22 ENCOUNTER — Inpatient Hospital Stay: Payer: Medicare HMO | Attending: Hematology and Oncology | Admitting: Hematology and Oncology

## 2020-08-22 DIAGNOSIS — D509 Iron deficiency anemia, unspecified: Secondary | ICD-10-CM

## 2020-08-22 DIAGNOSIS — C50212 Malignant neoplasm of upper-inner quadrant of left female breast: Secondary | ICD-10-CM

## 2020-08-30 ENCOUNTER — Other Ambulatory Visit: Payer: Self-pay

## 2020-08-30 ENCOUNTER — Inpatient Hospital Stay: Payer: Medicare HMO | Admitting: Hematology and Oncology

## 2020-08-30 ENCOUNTER — Encounter: Payer: Self-pay | Admitting: Hematology and Oncology

## 2020-08-30 ENCOUNTER — Inpatient Hospital Stay: Payer: Medicare HMO | Attending: Hematology and Oncology

## 2020-08-30 VITALS — BP 138/84 | HR 75 | Temp 97.8°F | Resp 16 | Wt 206.7 lb

## 2020-08-30 DIAGNOSIS — Z79811 Long term (current) use of aromatase inhibitors: Secondary | ICD-10-CM | POA: Insufficient documentation

## 2020-08-30 DIAGNOSIS — Z7984 Long term (current) use of oral hypoglycemic drugs: Secondary | ICD-10-CM | POA: Diagnosis not present

## 2020-08-30 DIAGNOSIS — E611 Iron deficiency: Secondary | ICD-10-CM | POA: Diagnosis not present

## 2020-08-30 DIAGNOSIS — Z170 Estrogen receptor positive status [ER+]: Secondary | ICD-10-CM | POA: Insufficient documentation

## 2020-08-30 DIAGNOSIS — C50212 Malignant neoplasm of upper-inner quadrant of left female breast: Secondary | ICD-10-CM | POA: Insufficient documentation

## 2020-08-30 DIAGNOSIS — Z7982 Long term (current) use of aspirin: Secondary | ICD-10-CM | POA: Insufficient documentation

## 2020-08-30 DIAGNOSIS — E039 Hypothyroidism, unspecified: Secondary | ICD-10-CM | POA: Insufficient documentation

## 2020-08-30 DIAGNOSIS — D509 Iron deficiency anemia, unspecified: Secondary | ICD-10-CM | POA: Diagnosis not present

## 2020-08-30 DIAGNOSIS — Z79899 Other long term (current) drug therapy: Secondary | ICD-10-CM | POA: Diagnosis not present

## 2020-08-30 DIAGNOSIS — E7800 Pure hypercholesterolemia, unspecified: Secondary | ICD-10-CM | POA: Diagnosis not present

## 2020-08-30 DIAGNOSIS — R600 Localized edema: Secondary | ICD-10-CM | POA: Diagnosis not present

## 2020-08-30 DIAGNOSIS — I10 Essential (primary) hypertension: Secondary | ICD-10-CM | POA: Diagnosis not present

## 2020-08-30 DIAGNOSIS — E119 Type 2 diabetes mellitus without complications: Secondary | ICD-10-CM | POA: Insufficient documentation

## 2020-08-30 DIAGNOSIS — D649 Anemia, unspecified: Secondary | ICD-10-CM

## 2020-08-30 LAB — COMPREHENSIVE METABOLIC PANEL
ALT: 17 U/L (ref 0–44)
AST: 23 U/L (ref 15–41)
Albumin: 3.7 g/dL (ref 3.5–5.0)
Alkaline Phosphatase: 67 U/L (ref 38–126)
Anion gap: 5 (ref 5–15)
BUN: 23 mg/dL (ref 8–23)
CO2: 27 mmol/L (ref 22–32)
Calcium: 9.4 mg/dL (ref 8.9–10.3)
Chloride: 105 mmol/L (ref 98–111)
Creatinine, Ser: 1.11 mg/dL — ABNORMAL HIGH (ref 0.44–1.00)
GFR, Estimated: 53 mL/min — ABNORMAL LOW (ref >60)
Glucose, Bld: 136 mg/dL — ABNORMAL HIGH (ref 70–99)
Potassium: 3.8 mmol/L (ref 3.5–5.1)
Sodium: 137 mmol/L (ref 135–145)
Total Bilirubin: 0.3 mg/dL (ref 0.3–1.2)
Total Protein: 7.6 g/dL (ref 6.5–8.1)

## 2020-08-30 LAB — CBC WITH DIFFERENTIAL/PLATELET
Abs Immature Granulocytes: 0.01 10*3/uL (ref 0.00–0.07)
Basophils Absolute: 0 10*3/uL (ref 0.0–0.1)
Basophils Relative: 1 %
Eosinophils Absolute: 0.1 10*3/uL (ref 0.0–0.5)
Eosinophils Relative: 2 %
HCT: 33.8 % — ABNORMAL LOW (ref 36.0–46.0)
Hemoglobin: 11.3 g/dL — ABNORMAL LOW (ref 12.0–15.0)
Immature Granulocytes: 0 %
Lymphocytes Relative: 30 %
Lymphs Abs: 1.5 10*3/uL (ref 0.7–4.0)
MCH: 28.8 pg (ref 26.0–34.0)
MCHC: 33.4 g/dL (ref 30.0–36.0)
MCV: 86.2 fL (ref 80.0–100.0)
Monocytes Absolute: 0.5 10*3/uL (ref 0.1–1.0)
Monocytes Relative: 10 %
Neutro Abs: 2.9 10*3/uL (ref 1.7–7.7)
Neutrophils Relative %: 57 %
Platelets: 188 10*3/uL (ref 150–400)
RBC: 3.92 MIL/uL (ref 3.87–5.11)
RDW: 15.9 % — ABNORMAL HIGH (ref 11.5–15.5)
WBC: 5 10*3/uL (ref 4.0–10.5)
nRBC: 0 % (ref 0.0–0.2)

## 2020-08-30 NOTE — Progress Notes (Signed)
[facility]  [address], [address] [place], [place] [address] Phone: [phone]  Fax: [phone]   Clinic Day:  08/30/2020  Referring physician: Ezequiel Kayser, MD  Chief Complaint: Caitlin Ford is a 70 y.o. female with stage I left breast canceron Femara and iron deficiency who is seen for 4 month assessment.   HPI: The patient was last seen in the medical oncology clinic on 04/24/2020. At that time, she felt good. She denied any breast concerns. Exam revealed apparent scarring in the left breast. Hematocrit was 35.0, hemoglobin 11.2, MCV 89.1, platelets 213,000, WBC 5,300. Creatinine was 1.02 (CrCl 59 ml/min). Ferritin was 13 with an iron saturation of 9% and a TIBC of 350. Vitamin B12 was 482 and folate 18.2. CA27.29 was 12.3. She continued Femara. She was to begin oral iron.  Left diagnostic mammogram on 05/02/2020 revealed no suspicious mammographic or sonographic findings corresponding with the patient's left breast physician palpated lump.  The patient saw Stephens November, NP on 06/25/2020 for new patient assessment. She reported heartburn. She was on oral iron.  EGD on 07/23/2020 by Dr. Haig Prophet showed erosive gastropathy with no stigmata of recent bleeding. Colonoscopy showed diverticulosis in the sigmoid colon, descending colon and ascending colon. There were non-bleeding internal hemorrhoids.  Labs on 05/28/2020 revealed a hematocrit of 35.4, hemoglobin 11.3, MCV 89.2, platelets 207,000, WBC 5,500. Ferritin was 18.  During the interim, she has been "ok." She has some leg swelling when she sits still all day. She performs monthly breast self exams and denies any breast concerns. She is tolerating Femara well without side effects.   She takes oral iron once daily. She eats meat almost everyday. She does not eat a lot of green leafy vegetables. She denies bleeding of any kind.  She does not take ibuprofen or Aleve. She is seeing Stephens November,  NP on 09/03/2020.   Past Medical History:  Diagnosis Date  . Arthritis   . Breast cancer ([place])   . Chronic kidney disease    Chronic Renal Insufficiency - Stage 2  . DDD (degenerative disc disease), cervical   . Diabetes mellitus without complication ([place])   . GERD (gastroesophageal reflux disease)    occ no meds  . Goiter   . Headache    simus  . Hypercholesteremia   . Hypertension   . Hypothyroidism   . Malignant neoplasm of upper-inner quadrant of left breast in female, estrogen receptor positive ([place]) 02/04/2019  . Personal history of radiation therapy   . Proteinuria   . Wears dentures    partial upper, full lower    Past Surgical History:  Procedure Laterality Date  . ABDOMINAL HYSTERECTOMY    . BREAST BIOPSY Left 12/14/2018   [facility]  . BREAST LUMPECTOMY Left 01/14/2019   [place]  . COLONOSCOPY WITH PROPOFOL N/A 02/09/2015   Procedure: COLONOSCOPY WITH PROPOFOL;  Surgeon: Hulen Luster, MD;  Location: [facility] ENDOSCOPY;  Service: Gastroenterology;  Laterality: N/A;  . COLONOSCOPY WITH PROPOFOL N/A 07/23/2020   Procedure: COLONOSCOPY WITH PROPOFOL;  Surgeon: Lesly Rubenstein, MD;  Location: [facility] ENDOSCOPY;  Service: Endoscopy;  Laterality: N/A;  . ESOPHAGOGASTRODUODENOSCOPY (EGD) WITH PROPOFOL N/A 07/23/2020   Procedure: ESOPHAGOGASTRODUODENOSCOPY (EGD) WITH PROPOFOL;  Surgeon: Lesly Rubenstein, MD;  Location: [facility] ENDOSCOPY;  Service: Endoscopy;  Laterality: N/A;  . PARTIAL MASTECTOMY WITH NEEDLE LOCALIZATION AND AXILLARY SENTINEL LYMPH NODE BX Left 01/14/2019   Procedure: PARTIAL MASTECTOMY WITH NEEDLE LOCALIZATION AND AXILLARY SENTINEL LYMPH NODE BX;  Surgeon:  Lysle Pearl, Isami, DO;  Location: [facility] ORS;  Service: General;  Laterality: Left;  . THYROIDECTOMY      Family History  Problem Relation Age of Onset  . Diabetes Mother   . Diabetes Maternal Aunt   . Diabetes Maternal Uncle   . Breast cancer Neg Hx     Social History:  reports that she has never smoked. She has never  used smokeless tobacco. She reports that she does not drink alcohol and does not use drugs. 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 She has two children. She lives in [place]. The patient is alone today.  Allergies:  Allergies  Allergen Reactions  . Altace [Ramipril] Other (See Comments)    Angioedema  . Avandia [Rosiglitazone] Other (See Comments)     Muscle Aches  . Crestor [Rosuvastatin] Other (See Comments)    Muscle ache  . Doxycycline Other (See Comments)    Muscle Aches  . Lipitor [Atorvastatin] Other (See Comments)    Muscle ache    Current Medications: Current Outpatient Medications  Medication Sig Dispense Refill  . aspirin EC 81 MG tablet Take 81 mg by mouth daily.     [name] glimepiride (AMARYL) 4 MG tablet Take 4 mg by mouth in the morning and at bedtime. Combines this with the 2 mg glimepiride    . glucose blood (PRECISION QID TEST) test strip 1 each (1 strip total) 2 (two) times daily Use as instructed.    [name] letrozole (FEMARA) 2.5 MG tablet TAKE 1 TABLET BY MOUTH EVERY DAY 90 tablet 0  . levothyroxine (SYNTHROID) 125 MCG tablet Take 125 mcg by mouth daily before breakfast.     . losartan (COZAAR) 100 MG tablet Take 100 mg by mouth every morning.     . metFORMIN (GLUCOPHAGE-XR) 500 MG 24 hr tablet Take 1,000 mg by mouth 2 (two) times daily.     . metoprolol succinate (TOPROL-XL) 25 MG 24 hr tablet Take 25 mg by mouth daily. Unsure of dose/ just started 8/20    . Multiple Vitamin (MULTI-VITAMIN) tablet Take 1 tablet by mouth daily.     [name] VERIO test strip 1 each 2 (two) times daily.    . pioglitazone (ACTOS) 15 MG tablet Take by mouth.    . simvastatin (ZOCOR) 40 MG tablet Take 40 mg by mouth daily at 6 PM.     . cetirizine (ZYRTEC) 10 MG tablet Take 10 mg by mouth as needed.  (Patient not taking: Reported on 08/30/2020)    . Cysteamine Bitartrate (PROCYSBI) 300 MG PACK See admin instructions. (Patient not taking: No sig  reported)    . fluticasone (FLONASE) 50 MCG/ACT nasal spray Place 1 spray into both nostrils as needed.  (Patient not taking: Reported on 08/30/2020)    . ibuprofen (ADVIL) 800 MG tablet Take 1 tablet (800 mg total) by mouth every 8 (eight) hours as needed for mild pain or moderate pain. (Patient not taking: No sig reported) 30 tablet 0   No current facility-administered medications for this visit.    Review of Systems  Constitutional: Negative for chills, diaphoresis, fever, malaise/fatigue and weight loss (up 4 lbs).       Feels "okay".  HENT: Negative for congestion, ear discharge, ear pain, hearing loss, nosebleeds, sinus pain, sore throat and tinnitus.   Eyes: Negative for blurred vision.  Respiratory: Negative for cough, hemoptysis, sputum production and shortness of breath.   Cardiovascular: Positive for leg swelling. Negative  for chest pain, palpitations and PND.  Gastrointestinal: Negative for abdominal pain, blood in stool, constipation, diarrhea, heartburn, melena, nausea and vomiting.  Genitourinary: Negative for dysuria, frequency, hematuria and urgency.  Musculoskeletal: Negative for back pain, joint pain, myalgias and neck pain.  Skin: Negative for itching and rash.  Neurological: Negative for dizziness, tingling, sensory change and headaches.  Endo/Heme/Allergies: Does not bruise/bleed easily.  Psychiatric/Behavioral: Negative for depression and memory loss. The patient is not nervous/anxious and does not have insomnia.   All other systems reviewed and are negative.  Performance status (ECOG): 0  Vitals Blood pressure 138/84, pulse 75, temperature 97.8 F (36.6 C), resp. rate 16, weight 206 lb 11.2 oz (93.8 kg).   Physical Exam Vitals and nursing note reviewed.  Constitutional:      General: She is not in acute distress.    Appearance: She is well-developed. She is not diaphoretic.     Interventions: Face mask in place.  HENT:     Head: Normocephalic and atraumatic.      Comments: Short black hair.    Mouth/Throat:     Mouth: Mucous membranes are moist.     Pharynx: Oropharynx is clear. No oropharyngeal exudate.  Eyes:     General: No scleral icterus.    Conjunctiva/sclera: Conjunctivae normal.     Pupils: Pupils are equal, round, and reactive to light.     Comments: Glasses. Brown eyes.  Cardiovascular:     Rate and Rhythm: Normal rate and regular rhythm.     Heart sounds: Normal heart sounds. No murmur heard.   Pulmonary:     Effort: Pulmonary effort is normal. No respiratory distress.     Breath sounds: Normal breath sounds. No wheezing or rales.  Chest:     Chest wall: No tenderness.  Breasts:     Right: No swelling, bleeding, mass, skin change, tenderness, axillary adenopathy or supraclavicular adenopathy.     Left: Skin change (scarring in upper outer quadrant) present. No swelling, bleeding, mass, tenderness, axillary adenopathy or supraclavicular adenopathy.    Abdominal:     General: Bowel sounds are normal. There is no distension.     Palpations: Abdomen is soft. There is no mass.     Tenderness: There is no abdominal tenderness. There is no guarding or rebound.  Musculoskeletal:        General: No tenderness. Normal range of motion.     Cervical back: Normal range of motion and neck supple.  Lymphadenopathy:     Head:     Right side of head: No preauricular, posterior auricular or occipital adenopathy.     Left side of head: No preauricular, posterior auricular or occipital adenopathy.     Cervical: No cervical adenopathy.     Upper Body:     Right upper body: No supraclavicular or axillary adenopathy.     Left upper body: No supraclavicular or axillary adenopathy.     Lower Body: No right inguinal adenopathy. No left inguinal adenopathy.  Skin:    General: Skin is warm and dry.  Neurological:     Mental Status: She is alert and oriented to person, place, and time.  Psychiatric:        Behavior: Behavior normal.         Thought Content: Thought content normal.        Judgment: Judgment normal.    Appointment on 08/30/2020  Component Date Value Ref Range Status  . Sodium 08/30/2020 137  135 - 145 mmol/L Final  .  Potassium 08/30/2020 3.8  3.5 - 5.1 mmol/L Final  . Chloride 08/30/2020 105  98 - 111 mmol/L Final  . CO2 08/30/2020 27  22 - 32 mmol/L Final  . Glucose, Bld 08/30/2020 136* 70 - 99 mg/dL Final   Glucose reference range applies only to samples taken after fasting for at least 8 hours.  . BUN 08/30/2020 23  8 - 23 mg/dL Final  . Creatinine, Ser 08/30/2020 1.11* 0.44 - 1.00 mg/dL Final  . Calcium 08/30/2020 9.4  8.9 - 10.3 mg/dL Final  . Total Protein 08/30/2020 7.6  6.5 - 8.1 g/dL Final  . Albumin 08/30/2020 3.7  3.5 - 5.0 g/dL Final  . AST 08/30/2020 23  15 - 41 U/L Final  . ALT 08/30/2020 17  0 - 44 U/L Final  . Alkaline Phosphatase 08/30/2020 67  38 - 126 U/L Final  . Total Bilirubin 08/30/2020 0.3  0.3 - 1.2 mg/dL Final  . GFR, Estimated 08/30/2020 53* >60 mL/min Final   Comment: (NOTE) Calculated using the CKD-EPI Creatinine Equation (2021)   . Anion gap 08/30/2020 5  5 - 15 Final   Performed at [facility], [address], [place], [place] [address]  . WBC 08/30/2020 5.0  4.0 - 10.5 K/uL Final  . RBC 08/30/2020 3.92  3.87 - 5.11 MIL/uL Final  . Hemoglobin 08/30/2020 11.3* 12.0 - 15.0 g/dL Final  . HCT 08/30/2020 33.8* 36.0 - 46.0 % Final  . MCV 08/30/2020 86.2  80.0 - 100.0 fL Final  . MCH 08/30/2020 28.8  26.0 - 34.0 pg Final  . MCHC 08/30/2020 33.4  30.0 - 36.0 g/dL Final  . RDW 08/30/2020 15.9* 11.5 - 15.5 % Final  . Platelets 08/30/2020 188  150 - 400 K/uL Final  . nRBC 08/30/2020 0.0  0.0 - 0.2 % Final  . Neutrophils Relative % 08/30/2020 57  % Final  . Neutro Abs 08/30/2020 2.9  1.7 - 7.7 K/uL Final  . Lymphocytes Relative 08/30/2020 30  % Final  . Lymphs Abs 08/30/2020 1.5  0.7 - 4.0 K/uL Final  . Monocytes Relative 08/30/2020 10  % Final  . Monocytes  Absolute 08/30/2020 0.5  0.1 - 1.0 K/uL Final  . Eosinophils Relative 08/30/2020 2  % Final  . Eosinophils Absolute 08/30/2020 0.1  0.0 - 0.5 K/uL Final  . Basophils Relative 08/30/2020 1  % Final  . Basophils Absolute 08/30/2020 0.0  0.0 - 0.1 K/uL Final  . Immature Granulocytes 08/30/2020 0  % Final  . Abs Immature Granulocytes 08/30/2020 0.01  0.00 - 0.07 K/uL Final   Performed at [facility], [address] [place], [place] [address]     Assessment:  Caitlin Ford is a 70 y.o. female withstage IAleft breast cancers/plumpectomy and axillary lymph node biopsy on 01/14/2019. 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 Two sentinel lymph nodes negative for malignancy.Tumor wasER+>90%, PR +>90%, andHER-2/neu negative (1+).Pathologicstagewas pT1b pN0.  Bilateral screening mammogramon 11/25/2018 revealed a possible distortion in the left breast. Bilateral diagnostic mammogramon 12/02/2018 revealed a persistent left breast distortion in the upper inner quadrant, middle depth.  Bilateral diagnostic mammogram on 11/29/2019 revealed expected postsurgical changes in the left breast. There were no mammographic evidence of malignancy bilaterally.  Left diagnostic mammogram on 05/02/2020 revealed no suspicious mammographic or sonographic findings.  Sheunderwentbreastradiationfrom09/23/2020 - 03/21/2019.She began [facility] 03/31/2019.  CA27.29 has been followed:15 on07/31/2020,15.2 on 03/14/2019, 16.9 on 03/31/2019, 22.6 on 08/05/2019, 14.1 on  12/12/2019, 12.3 on 04/24/2020, and 17.2 on 08/30/2020.  Bone densityon 10/21/2018 was normalwith a T-score of +0.3 in the left femoral neck and + 1.3 in AP spine L1-L4.  She has iron deficiency anemia.  Ferritin was 13 on 04/24/2020 and 18 on 05/28/2020.    EGD on 07/23/2020 revealed erosive gastropathy with no stigmata of recent bleeding. Colonoscopy on  07/23/2020 revealed diverticulosis in the sigmoid colon, descending colon and ascending colon. There were non-bleeding internal hemorrhoids.  She received her second COVID-19 vaccine in 06/2019. She received the [employer] shot on 03/15/2020.  Symptomatically, she feels "ok." She performs monthly breast self exams and denies any breast concerns. She is tolerating Femara well.  Diet is good.  She denies any bleeding.  Exam is stable.  Plan: 1.   Labs today: CBC with diff, CMP, CA27.29, ferritin. 2.Clinical stage I left breast cancer She is s/p lumpectomy and SLN biopsy (01/14/2019). She is s/p breast radiation (completed 03/21/2019). She began Femara on 03/31/2019.  Clinically, she continues to do well.  Exam remains stable with significant scarring at the 2 o'clock position in the left breast.  Bilateral mammogram on 11/29/2019 revealed no evidence of recurrence.   Follow-up left diagnostic mammogram on 05/02/2020 revealed no suspicious mammographic or sonographic findings.  Continue surveillance. 3.Iron deficiency anemia  Hematocrit 33.8.  Hemoglobin 11.3.  MCV 86.2 on 08/30/2020.  Review interval GI notes and procedures:   EGD on 07/23/2020 by revealed erosive gastropathy with no stigmata of recent bleeding.    Colonoscopy on 07/23/2020 diverticulosis. There were non-bleeding internal hemorrhoids.  Diet is good.  She denies any bleeding.  Creatinine is 1.11.  Ferritin was 18 (low) on 05/28/2020.  B12 was 482 and folate 18.2 on 04/24/2020.  She is on oral iron. 4.   RN: Call patient with ferritin level. 5.   RTC in 4 months for MD assessment and labs (CBC with diff, CMP, ferritin, CA27.29).  I discussed the assessment and treatment plan with the patient.  The patient was provided an opportunity to ask questions and all were answered.  The patient agreed with the plan and demonstrated an understanding of the instructions.  The patient was  advised to call back if the symptoms worsen or if the condition fails to improve as anticipated.  I provided 15 minutes of face-to-face time during this this encounter and > 50% was spent counseling as documented under my assessment and plan.  An additional 10 minutes were spent reviewing her chart (Epic and Care Everywhere) including notes, labs, and imaging studies.    Lequita Asal, MD, PhD    08/30/2020, 3:46 PM  I, De Burrs, am acting as Education administrator for [employer]. Mike Gip, MD, PhD.  I, Nyeema Want C. Mike Gip, MD, have reviewed the above documentation for accuracy and completeness, and I agree with the above.

## 2020-08-30 NOTE — Progress Notes (Signed)
Patient denies new problems/concerns today.  Had a colonoscopy and upper endoscopy on 07/23/20.

## 2020-08-31 LAB — CANCER ANTIGEN 27.29: CA 27.29: 17.2 U/mL (ref 0.0–38.6)

## 2020-11-02 ENCOUNTER — Other Ambulatory Visit: Payer: Self-pay | Admitting: Surgery

## 2020-11-02 DIAGNOSIS — Z853 Personal history of malignant neoplasm of breast: Secondary | ICD-10-CM

## 2020-11-16 ENCOUNTER — Other Ambulatory Visit: Payer: Self-pay

## 2020-11-16 ENCOUNTER — Other Ambulatory Visit: Payer: Self-pay | Admitting: Nurse Practitioner

## 2020-11-16 DIAGNOSIS — Z170 Estrogen receptor positive status [ER+]: Secondary | ICD-10-CM

## 2020-11-16 MED ORDER — LETROZOLE 2.5 MG PO TABS: 2.5000 mg | ORAL_TABLET | Freq: Every day | ORAL | 1 refills | Status: DC

## 2020-11-29 ENCOUNTER — Other Ambulatory Visit: Payer: Self-pay

## 2020-11-29 ENCOUNTER — Ambulatory Visit
Admission: RE | Admit: 2020-11-29 | Discharge: 2020-11-29 | Disposition: A | Discharge status: Home / Self Care | Payer: Medicare HMO | Source: Ambulatory Visit | Attending: Surgery | Admitting: Surgery

## 2020-11-29 DIAGNOSIS — Z853 Personal history of malignant neoplasm of breast: Secondary | ICD-10-CM | POA: Insufficient documentation

## 2020-12-20 ENCOUNTER — Ambulatory Visit: Payer: Medicare HMO | Admitting: Radiation Oncology

## 2021-01-01 ENCOUNTER — Other Ambulatory Visit: Payer: Self-pay

## 2021-01-01 DIAGNOSIS — C50212 Malignant neoplasm of upper-inner quadrant of left female breast: Secondary | ICD-10-CM

## 2021-01-03 ENCOUNTER — Inpatient Hospital Stay: Payer: Medicare HMO | Attending: Oncology

## 2021-01-03 ENCOUNTER — Encounter: Payer: Self-pay | Admitting: Oncology

## 2021-01-03 ENCOUNTER — Other Ambulatory Visit: Payer: Self-pay

## 2021-01-03 ENCOUNTER — Inpatient Hospital Stay: Payer: Medicare HMO | Admitting: Oncology

## 2021-01-03 VITALS — BP 137/73 | HR 75 | Temp 97.2°F | Resp 18 | Wt 208.8 lb

## 2021-01-03 DIAGNOSIS — I129 Hypertensive chronic kidney disease with stage 1 through stage 4 chronic kidney disease, or unspecified chronic kidney disease: Secondary | ICD-10-CM | POA: Insufficient documentation

## 2021-01-03 DIAGNOSIS — D631 Anemia in chronic kidney disease: Secondary | ICD-10-CM | POA: Insufficient documentation

## 2021-01-03 DIAGNOSIS — N1831 Chronic kidney disease, stage 3a: Secondary | ICD-10-CM

## 2021-01-03 DIAGNOSIS — Z79811 Long term (current) use of aromatase inhibitors: Secondary | ICD-10-CM

## 2021-01-03 DIAGNOSIS — Z170 Estrogen receptor positive status [ER+]: Secondary | ICD-10-CM | POA: Diagnosis not present

## 2021-01-03 DIAGNOSIS — E611 Iron deficiency: Secondary | ICD-10-CM | POA: Diagnosis not present

## 2021-01-03 DIAGNOSIS — C50212 Malignant neoplasm of upper-inner quadrant of left female breast: Secondary | ICD-10-CM

## 2021-01-03 DIAGNOSIS — D509 Iron deficiency anemia, unspecified: Secondary | ICD-10-CM | POA: Insufficient documentation

## 2021-01-03 LAB — CBC WITH DIFFERENTIAL/PLATELET
Abs Immature Granulocytes: 0.01 10*3/uL (ref 0.00–0.07)
Basophils Absolute: 0 10*3/uL (ref 0.0–0.1)
Basophils Relative: 1 %
Eosinophils Absolute: 0.1 10*3/uL (ref 0.0–0.5)
Eosinophils Relative: 1 %
HCT: 35 % — ABNORMAL LOW (ref 36.0–46.0)
Hemoglobin: 11.4 g/dL — ABNORMAL LOW (ref 12.0–15.0)
Immature Granulocytes: 0 %
Lymphocytes Relative: 28 %
Lymphs Abs: 1.2 10*3/uL (ref 0.7–4.0)
MCH: 28.7 pg (ref 26.0–34.0)
MCHC: 32.6 g/dL (ref 30.0–36.0)
MCV: 88.2 fL (ref 80.0–100.0)
Monocytes Absolute: 0.4 10*3/uL (ref 0.1–1.0)
Monocytes Relative: 9 %
Neutro Abs: 2.6 10*3/uL (ref 1.7–7.7)
Neutrophils Relative %: 61 %
Platelets: 214 10*3/uL (ref 150–400)
RBC: 3.97 MIL/uL (ref 3.87–5.11)
RDW: 15.8 % — ABNORMAL HIGH (ref 11.5–15.5)
WBC: 4.3 10*3/uL (ref 4.0–10.5)
nRBC: 0 % (ref 0.0–0.2)

## 2021-01-03 LAB — IRON AND TIBC
Iron: 71 ug/dL (ref 28–170)
Saturation Ratios: 20 % (ref 10.4–31.8)
TIBC: 350 ug/dL (ref 250–450)
UIBC: 279 ug/dL

## 2021-01-03 LAB — COMPREHENSIVE METABOLIC PANEL
ALT: 15 U/L (ref 0–44)
AST: 20 U/L (ref 15–41)
Albumin: 3.7 g/dL (ref 3.5–5.0)
Alkaline Phosphatase: 75 U/L (ref 38–126)
Anion gap: 4 — ABNORMAL LOW (ref 5–15)
BUN: 26 mg/dL — ABNORMAL HIGH (ref 8–23)
CO2: 27 mmol/L (ref 22–32)
Calcium: 9.4 mg/dL (ref 8.9–10.3)
Chloride: 106 mmol/L (ref 98–111)
Creatinine, Ser: 1.27 mg/dL — ABNORMAL HIGH (ref 0.44–1.00)
GFR, Estimated: 45 mL/min — ABNORMAL LOW (ref >60)
Glucose, Bld: 164 mg/dL — ABNORMAL HIGH (ref 70–99)
Potassium: 5.1 mmol/L (ref 3.5–5.1)
Sodium: 137 mmol/L (ref 135–145)
Total Bilirubin: 0.5 mg/dL (ref 0.3–1.2)
Total Protein: 7.6 g/dL (ref 6.5–8.1)

## 2021-01-03 LAB — FERRITIN: Ferritin: 32 ng/mL (ref 11–307)

## 2021-01-03 NOTE — Progress Notes (Signed)
Pt here for follow up. No new concerns voiced. No new breast problems  

## 2021-01-03 NOTE — Progress Notes (Signed)
[facility]  [address], [address] [place], [place] [address] Phone: [phone]  Fax: [phone]   Clinic Day:  01/03/2021  Referring physician: Ezequiel Kayser, MD  Chief Complaint: Caitlin Ford is a 70 y.o. female presents for follow up of stage I left breast cancer and iron deficiency  PERTINENT HEMATOLOGY HISTORY Patient previously followed up by Dr.Corcoran, patient switched care to me on 01/03/21 Extensive medical record review was performed by me  #01/14/2019.  Stage IA left breast cancer s/p lumpectomy and axillary lymph node biopsy. 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  Two sentinel lymph nodes negative for malignancy.  Tumor was ER + >90%, PR + >90%, and HER-2/neu negative (1+).  Pathologic stage was pT1b pN0.  02/16/2019 - 03/21/2019 Adjuvant radiation 03/31/2019.  Started on letrozole.   Bone density on 10/21/2018 was normal with a T-score of +0.3 in the left femoral neck and + 1.3 in AP spine L1-L4.   # iron deficiency anemia.   Gastroenterology work-up EGD on 07/23/2020 revealed erosive gastropathy with no stigmata of recent bleeding. Colonoscopy on 07/23/2020 revealed diverticulosis in the sigmoid colon, descending colon and ascending colon. There were non-bleeding internal hemorrhoids.   INTERVAL HISTORY Caitlin Ford is a 70 y.o. female who has above history reviewed by me today presents for follow up visit for history of stage Ia left breast cancer, and iron deficiency anemia Patient is taking letrozole.  She tolerates well.  No significant side effects. She has diabetes, follows [facility] clinic endocrinology.  Primary care doctor Dr. Emilee Hero recently retired and she is in process of finding a new primary care provider. Reports feeling well today.  No new complaints.  Denies any breast concerns. She gained 3 pounds since last visit.  Past Medical History:  Diagnosis  Date   Arthritis    Breast cancer ([place])    Chronic kidney disease    Chronic Renal Insufficiency - Stage 2   DDD (degenerative disc disease), cervical    Diabetes mellitus without complication (HCC)    GERD (gastroesophageal reflux disease)    occ no meds   Goiter    Headache    simus   Hypercholesteremia    Hypertension    Hypothyroidism    Malignant neoplasm of upper-inner quadrant of left breast in female, estrogen receptor positive ([place]) 02/04/2019   Personal history of radiation therapy    Proteinuria    Wears dentures    partial upper, full lower    Past Surgical History:  Procedure Laterality Date   ABDOMINAL HYSTERECTOMY     BREAST BIOPSY Left 12/14/2018   [facility]   BREAST LUMPECTOMY Left 01/14/2019   [facility]   COLONOSCOPY WITH PROPOFOL N/A 02/09/2015   Procedure: COLONOSCOPY WITH PROPOFOL;  Surgeon: Hulen Luster, MD;  Location: [facility] ENDOSCOPY;  Service: Gastroenterology;  Laterality: N/A;   COLONOSCOPY WITH PROPOFOL N/A 07/23/2020   Procedure: COLONOSCOPY WITH PROPOFOL;  Surgeon: Lesly Rubenstein, MD;  Location: [facility] ENDOSCOPY;  Service: Endoscopy;  Laterality: N/A;   ESOPHAGOGASTRODUODENOSCOPY (EGD) WITH PROPOFOL N/A 07/23/2020   Procedure: ESOPHAGOGASTRODUODENOSCOPY (EGD) WITH PROPOFOL;  Surgeon: Lesly Rubenstein, MD;  Location: [facility] ENDOSCOPY;  Service: Endoscopy;  Laterality: N/A;   PARTIAL MASTECTOMY WITH NEEDLE LOCALIZATION AND AXILLARY SENTINEL LYMPH NODE BX Left 01/14/2019   Procedure: PARTIAL MASTECTOMY WITH NEEDLE LOCALIZATION AND AXILLARY SENTINEL LYMPH NODE BX;  Surgeon: Benjamine Sprague, DO;  Location: [facility] ORS;  Service: General;  Laterality: Left;   THYROIDECTOMY      Family History  Problem Relation Age of Onset   Diabetes Mother    Diabetes Maternal Aunt    Diabetes Maternal Uncle    Breast cancer Neg Hx     Social History:  reports that she has never smoked. She has never used smokeless tobacco. She reports that she does not drink alcohol and does not use  drugs. 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 She has two children. She lives in [place]. The patient is alone today.  Allergies:  Allergies  Allergen Reactions   Altace [Ramipril] Other (See Comments)    Angioedema   Avandia [Rosiglitazone] Other (See Comments)     Muscle Aches   Crestor [Rosuvastatin] Other (See Comments)    Muscle ache   Doxycycline Other (See Comments)    Muscle Aches   Lipitor [Atorvastatin] Other (See Comments)    Muscle ache    Current Medications: Current Outpatient Medications  Medication Sig Dispense Refill   aspirin EC 81 MG tablet Take 81 mg by mouth daily.      glimepiride (AMARYL) 4 MG tablet Take 4 mg by mouth in the morning and at bedtime. Combines this with the 2 mg glimepiride     glucose blood (PRECISION QID TEST) test strip 1 each (1 strip total) 2 (two) times daily Use as instructed.     letrozole (FEMARA) 2.5 MG tablet Take 1 tablet (2.5 mg total) by mouth daily. 90 tablet 1   levothyroxine (SYNTHROID) 88 MCG tablet Take by mouth.     losartan (COZAAR) 100 MG tablet Take 100 mg by mouth every morning.      metFORMIN (GLUCOPHAGE-XR) 500 MG 24 hr tablet Take 1,000 mg by mouth 2 (two) times daily.      metoprolol succinate (TOPROL-XL) 25 MG 24 hr tablet Take 25 mg by mouth daily. Unsure of dose/ just started 8/20     Multiple Vitamin (MULTI-VITAMIN) tablet Take 1 tablet by mouth daily.      ONETOUCH VERIO test strip 1 each 2 (two) times daily.     pioglitazone (ACTOS) 15 MG tablet Take by mouth.     simvastatin (ZOCOR) 40 MG tablet Take 40 mg by mouth daily at 6 PM.      No current facility-administered medications for this visit.    Review of Systems  Constitutional:  Negative for chills, diaphoresis, fever, malaise/fatigue and weight loss.       Feels "okay".  HENT:  Negative for congestion, ear discharge, ear pain, hearing loss, nosebleeds, sinus pain, sore throat and tinnitus.   Eyes:  Negative  for blurred vision.  Respiratory:  Negative for cough, hemoptysis, sputum production and shortness of breath.   Cardiovascular:  Negative for chest pain, palpitations, leg swelling and PND.  Gastrointestinal:  Negative for abdominal pain, blood in stool, constipation, diarrhea, heartburn, melena, nausea and vomiting.  Genitourinary:  Negative for dysuria, frequency, hematuria and urgency.  Musculoskeletal:  Negative for back pain, joint pain, myalgias and neck pain.  Skin:  Negative for itching and rash.  Neurological:  Negative for dizziness, tingling, sensory change and headaches.  Endo/Heme/Allergies:  Does not bruise/bleed easily.  Psychiatric/Behavioral:  Negative for depression and memory loss. The patient is not nervous/anxious and does not have insomnia.   All other systems reviewed and are negative. Performance status (ECOG): 0  Vitals Blood pressure 137/73, pulse 75, temperature (!) 97.2 F (36.2 C), resp.  rate 18, weight 208 lb 12.4 oz (94.7 kg).  Physical Exam Constitutional:      General: She is not in acute distress.    Appearance: She is not diaphoretic.  HENT:     Head: Normocephalic and atraumatic.     Nose: Nose normal.     Mouth/Throat:     Pharynx: No oropharyngeal exudate.  Eyes:     General: No scleral icterus.    Pupils: Pupils are equal, round, and reactive to light.  Cardiovascular:     Rate and Rhythm: Normal rate and regular rhythm.     Heart sounds: No murmur heard. Pulmonary:     Effort: Pulmonary effort is normal. No respiratory distress.     Breath sounds: No rales.  Chest:     Chest wall: No tenderness.  Abdominal:     General: There is no distension.     Palpations: Abdomen is soft.     Tenderness: There is no abdominal tenderness.  Musculoskeletal:        General: Normal range of motion.     Cervical back: Normal range of motion and neck supple.  Skin:    General: Skin is warm and dry.     Findings: No erythema.  Neurological:      Mental Status: She is alert and oriented to person, place, and time.     Cranial Nerves: No cranial nerve deficit.     Motor: No abnormal muscle tone.     Coordination: Coordination normal.  Psychiatric:        Mood and Affect: Affect normal.    Orders Only on 01/03/2021  Component Date Value Ref Range Status   Iron 01/03/2021 71  28 - 170 ug/dL Final   TIBC 01/03/2021 350  250 - 450 ug/dL Final   Saturation Ratios 01/03/2021 20  10.4 - 31.8 % Final   UIBC 01/03/2021 279  ug/dL Final   Performed at [facility], [address] [place], [place] [address]  Appointment on 01/03/2021  Component Date Value Ref Range Status   Ferritin 01/03/2021 32  11 - 307 ng/mL Final   Performed at [facility], [address] [place], [place] [address]   WBC 01/03/2021 4.3  4.0 - 10.5 K/uL Final   RBC 01/03/2021 3.97  3.87 - 5.11 MIL/uL Final   Hemoglobin 01/03/2021 11.4 (A) 12.0 - 15.0 g/dL Final   HCT 01/03/2021 35.0 (A) 36.0 - 46.0 % Final   MCV 01/03/2021 88.2  80.0 - 100.0 fL Final   MCH 01/03/2021 28.7  26.0 - 34.0 pg Final   MCHC 01/03/2021 32.6  30.0 - 36.0 g/dL Final   RDW 01/03/2021 15.8 (A) 11.5 - 15.5 % Final   Platelets 01/03/2021 214  150 - 400 K/uL Final   nRBC 01/03/2021 0.0  0.0 - 0.2 % Final   Neutrophils Relative % 01/03/2021 61  % Final   Neutro Abs 01/03/2021 2.6  1.7 - 7.7 K/uL Final   Lymphocytes Relative 01/03/2021 28  % Final   Lymphs Abs 01/03/2021 1.2  0.7 - 4.0 K/uL Final   Monocytes Relative 01/03/2021 9  % Final   Monocytes Absolute 01/03/2021 0.4  0.1 - 1.0 K/uL Final   Eosinophils Relative 01/03/2021 1  % Final   Eosinophils Absolute 01/03/2021 0.1  0.0 - 0.5 K/uL Final   Basophils Relative 01/03/2021 1  % Final   Basophils Absolute 01/03/2021 0.0  0.0 - 0.1 K/uL Final   Immature Granulocytes 01/03/2021 0  %  Final   Abs Immature Granulocytes 01/03/2021 0.01  0.00 - 0.07 K/uL Final   Performed at [facility], [address] [place], [place] [address]   Sodium 01/03/2021 137  135 - 145 mmol/L Final   Potassium 01/03/2021 5.1  3.5 - 5.1 mmol/L Final   Chloride 01/03/2021 106  98 - 111 mmol/L Final   CO2 01/03/2021 27  22 - 32 mmol/L Final   Glucose, Bld 01/03/2021 164 (A) 70 - 99 mg/dL Final   Glucose reference range applies only to samples taken after fasting for at least 8 hours.   BUN 01/03/2021 26 (A) 8 - 23 mg/dL Final   Creatinine, Ser 01/03/2021 1.27 (A) 0.44 - 1.00 mg/dL Final   Calcium 01/03/2021 9.4  8.9 - 10.3 mg/dL Final   Total Protein 01/03/2021 7.6  6.5 - 8.1 g/dL Final   Albumin 01/03/2021 3.7  3.5 - 5.0 g/dL Final   AST 01/03/2021 20  15 - 41 U/L Final   ALT 01/03/2021 15  0 - 44 U/L Final   Alkaline Phosphatase 01/03/2021 75  38 - 126 U/L Final   Total Bilirubin 01/03/2021 0.5  0.3 - 1.2 mg/dL Final   GFR, Estimated 01/03/2021 45 (A) >60 mL/min Final   Comment: (NOTE) Calculated using the CKD-EPI Creatinine Equation (2021)    Anion gap 01/03/2021 4 (A) 5 - 15 Final   Performed at [facility], [address], [place], [place] [address]     Assessment and plan  1. Malignant neoplasm of upper-inner quadrant of left breast in female, estrogen receptor positive ([place])   2. Iron deficiency   3. Aromatase inhibitor use   4. Anemia in stage 3a chronic kidney disease (HCC)    #History of stage Ia left breast cancer, ER positive, PR positive, HER2 negative. pTb pN0 Patient tolerates letrozole.  Labs reviewed and discussed with patient Continue letrozole. She is due for a bone density.  Previous bone density was done at [facility] clinic.  Primary care provider has retired. Will ask [facility] clinic endocrinology provider to see if they can prescribe bone density. Recommend patient to continue calcium and vitamin D supplement   .#History of iron deficiency anemia.  In the context of chronic kidney disease Labs reviewed and discussed with patient.  Hemoglobin is stable at 11.4. Iron  labs are reviewed.  Ferritin level is at 32, iron saturation 20. Recommend patient to continue oral iron supplementation twice daily. No need for erythropoietin replacement therapy at this point.   I discussed the assessment and treatment plan with the patient.  The patient was provided an opportunity to ask questions and all were answered.  The patient agreed with the plan and demonstrated an understanding of the instructions.  The patient was advised to call back if the symptoms worsen or if the condition fails to improve as anticipated.  We spent sufficient time to discuss many aspect of care, questions were answered to patient's satisfaction. A total of 40 minutes was spent on this visit.  With 15 minutes spent reviewing image findings, pathology reports, 20 minutes counseling the patient on the diagnosis, goal of care, chemotherapy treatments, side effects of the treatment, management of symptoms.  Additional 5 minutes was spent on answering patient's questions.   Earlie Server, MD, PhD Hematology Oncology [place] at [facility] 01/03/2021

## 2021-01-04 ENCOUNTER — Telehealth: Payer: Self-pay

## 2021-01-04 LAB — CANCER ANTIGEN 27.29: CA 27.29: 16.7 U/mL (ref 0.0–38.6)

## 2021-01-04 NOTE — Telephone Encounter (Signed)
Patient is overdue for DEXA scan. She gets them done at [facility] clinic. I have called Dr. Honor Junes at [facility] endocrinology to see if she can orders. No answer at office, Letter sent.

## 2021-01-23 ENCOUNTER — Ambulatory Visit: Payer: Medicare HMO | Admitting: Radiation Oncology

## 2021-04-29 DIAGNOSIS — Z13820 Encounter for screening for osteoporosis: Secondary | ICD-10-CM | POA: Diagnosis not present

## 2021-04-29 DIAGNOSIS — Z780 Asymptomatic menopausal state: Secondary | ICD-10-CM | POA: Diagnosis not present

## 2021-05-07 ENCOUNTER — Inpatient Hospital Stay: Payer: Medicare HMO | Attending: Oncology

## 2021-05-07 ENCOUNTER — Other Ambulatory Visit: Payer: Self-pay

## 2021-05-07 DIAGNOSIS — E611 Iron deficiency: Secondary | ICD-10-CM

## 2021-05-07 DIAGNOSIS — C50212 Malignant neoplasm of upper-inner quadrant of left female breast: Secondary | ICD-10-CM | POA: Diagnosis not present

## 2021-05-07 DIAGNOSIS — Z79811 Long term (current) use of aromatase inhibitors: Secondary | ICD-10-CM

## 2021-05-07 DIAGNOSIS — Z170 Estrogen receptor positive status [ER+]: Secondary | ICD-10-CM

## 2021-05-07 DIAGNOSIS — D631 Anemia in chronic kidney disease: Secondary | ICD-10-CM

## 2021-05-07 DIAGNOSIS — D509 Iron deficiency anemia, unspecified: Secondary | ICD-10-CM | POA: Insufficient documentation

## 2021-05-07 DIAGNOSIS — N1831 Chronic kidney disease, stage 3a: Secondary | ICD-10-CM

## 2021-05-07 LAB — CBC WITH DIFFERENTIAL/PLATELET
Abs Immature Granulocytes: 0.02 10*3/uL (ref 0.00–0.07)
Basophils Absolute: 0 10*3/uL (ref 0.0–0.1)
Basophils Relative: 1 %
Eosinophils Absolute: 0.1 10*3/uL (ref 0.0–0.5)
Eosinophils Relative: 2 %
HCT: 34.9 % — ABNORMAL LOW (ref 36.0–46.0)
Hemoglobin: 11.2 g/dL — ABNORMAL LOW (ref 12.0–15.0)
Immature Granulocytes: 0 %
Lymphocytes Relative: 25 %
Lymphs Abs: 1.4 10*3/uL (ref 0.7–4.0)
MCH: 28.6 pg (ref 26.0–34.0)
MCHC: 32.1 g/dL (ref 30.0–36.0)
MCV: 89.3 fL (ref 80.0–100.0)
Monocytes Absolute: 0.5 10*3/uL (ref 0.1–1.0)
Monocytes Relative: 10 %
Neutro Abs: 3.3 10*3/uL (ref 1.7–7.7)
Neutrophils Relative %: 62 %
Platelets: 204 10*3/uL (ref 150–400)
RBC: 3.91 MIL/uL (ref 3.87–5.11)
RDW: 15 % (ref 11.5–15.5)
WBC: 5.3 10*3/uL (ref 4.0–10.5)
nRBC: 0 % (ref 0.0–0.2)

## 2021-05-07 LAB — COMPREHENSIVE METABOLIC PANEL
ALT: 14 U/L (ref 0–44)
AST: 18 U/L (ref 15–41)
Albumin: 3.7 g/dL (ref 3.5–5.0)
Alkaline Phosphatase: 88 U/L (ref 38–126)
Anion gap: 11 (ref 5–15)
BUN: 16 mg/dL (ref 8–23)
CO2: 26 mmol/L (ref 22–32)
Calcium: 9.5 mg/dL (ref 8.9–10.3)
Chloride: 102 mmol/L (ref 98–111)
Creatinine, Ser: 0.94 mg/dL (ref 0.44–1.00)
GFR, Estimated: >60 mL/min (ref >60)
Glucose, Bld: 159 mg/dL — ABNORMAL HIGH (ref 70–99)
Potassium: 4.4 mmol/L (ref 3.5–5.1)
Sodium: 139 mmol/L (ref 135–145)
Total Bilirubin: 0.7 mg/dL (ref 0.3–1.2)
Total Protein: 7.8 g/dL (ref 6.5–8.1)

## 2021-05-07 LAB — IRON AND TIBC
Iron: 65 ug/dL (ref 28–170)
Saturation Ratios: 23 % (ref 10.4–31.8)
TIBC: 277 ug/dL (ref 250–450)
UIBC: 212 ug/dL

## 2021-05-07 LAB — FERRITIN: Ferritin: 40 ng/mL (ref 11–307)

## 2021-05-09 ENCOUNTER — Encounter: Payer: Self-pay | Admitting: Oncology

## 2021-05-09 ENCOUNTER — Inpatient Hospital Stay: Payer: Medicare HMO | Attending: Oncology | Admitting: Oncology

## 2021-05-09 ENCOUNTER — Other Ambulatory Visit: Payer: Self-pay

## 2021-05-09 VITALS — BP 174/84 | HR 75 | Temp 97.2°F | Wt 209.1 lb

## 2021-05-09 DIAGNOSIS — I129 Hypertensive chronic kidney disease with stage 1 through stage 4 chronic kidney disease, or unspecified chronic kidney disease: Secondary | ICD-10-CM | POA: Insufficient documentation

## 2021-05-09 DIAGNOSIS — N1831 Chronic kidney disease, stage 3a: Secondary | ICD-10-CM | POA: Diagnosis not present

## 2021-05-09 DIAGNOSIS — D509 Iron deficiency anemia, unspecified: Secondary | ICD-10-CM | POA: Diagnosis not present

## 2021-05-09 DIAGNOSIS — D631 Anemia in chronic kidney disease: Secondary | ICD-10-CM | POA: Insufficient documentation

## 2021-05-09 DIAGNOSIS — Z170 Estrogen receptor positive status [ER+]: Secondary | ICD-10-CM | POA: Insufficient documentation

## 2021-05-09 DIAGNOSIS — Z79811 Long term (current) use of aromatase inhibitors: Secondary | ICD-10-CM | POA: Diagnosis not present

## 2021-05-09 DIAGNOSIS — Z90710 Acquired absence of both cervix and uterus: Secondary | ICD-10-CM | POA: Insufficient documentation

## 2021-05-09 DIAGNOSIS — Z803 Family history of malignant neoplasm of breast: Secondary | ICD-10-CM | POA: Insufficient documentation

## 2021-05-09 DIAGNOSIS — E1122 Type 2 diabetes mellitus with diabetic chronic kidney disease: Secondary | ICD-10-CM | POA: Diagnosis not present

## 2021-05-09 DIAGNOSIS — C50212 Malignant neoplasm of upper-inner quadrant of left female breast: Secondary | ICD-10-CM | POA: Diagnosis not present

## 2021-05-09 MED ORDER — LETROZOLE 2.5 MG PO TABS: 2.5000 mg | ORAL_TABLET | Freq: Every day | ORAL | 1 refills | Status: DC

## 2021-05-09 NOTE — Progress Notes (Signed)
Clinic Day:  05/09/2021  Referring physician: No ref. provider found  Chief Complaint: Caitlin Ford is a 70 y.o. female presents for follow up of stage I left breast cancer and iron deficiency  PERTINENT HEMATOLOGY HISTORY Patient previously followed up by Dr.Corcoran, patient switched care to me on 01/03/21 Extensive medical record review was performed by me  #01/14/2019.  Stage IA left breast cancer s/p lumpectomy and axillary lymph node biopsy. 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  Two sentinel lymph nodes negative for malignancy.  Tumor was ER + >90%, PR + >90%, and HER-2/neu negative (1+).  Pathologic stage was pT1b pN0.  02/16/2019 - 03/21/2019 Adjuvant radiation 03/31/2019.  Started on letrozole.   Bone density on 10/21/2018 was normal with a T-score of +0.3 in the left femoral neck and + 1.3 in AP spine L1-L4.   # iron deficiency anemia.   Gastroenterology work-up EGD on 07/23/2020 revealed erosive gastropathy with no stigmata of recent bleeding. Colonoscopy on 07/23/2020 revealed diverticulosis in the sigmoid colon, descending colon and ascending colon. There were non-bleeding internal hemorrhoids.   INTERVAL HISTORY Caitlin Ford is a 70 y.o. female who has above history reviewed by me today presents for follow up visit for history of stage Ia left breast cancer, and iron deficiency anemia Patient takes letrozole.  Tolerates well. Patient has had a repeat bone density done at [facility] clinic.  Results were not available to me yet.  Past Medical History:  Diagnosis Date   Arthritis    Breast cancer ([place])    Chronic kidney disease    Chronic Renal Insufficiency - Stage 2   DDD (degenerative disc disease), cervical    Diabetes mellitus without complication (HCC)    GERD (gastroesophageal reflux disease)    occ no meds   Goiter    Headache    simus   Hypercholesteremia    Hypertension     Hypothyroidism    Malignant neoplasm of upper-inner quadrant of left breast in female, estrogen receptor positive ([place]) 02/04/2019   Personal history of radiation therapy    Proteinuria    Wears dentures    partial upper, full lower    Past Surgical History:  Procedure Laterality Date   ABDOMINAL HYSTERECTOMY     BREAST BIOPSY Left 12/14/2018   [facility]   BREAST LUMPECTOMY Left 01/14/2019   [facility]   COLONOSCOPY WITH PROPOFOL N/A 02/09/2015   Procedure: COLONOSCOPY WITH PROPOFOL;  Surgeon: Hulen Luster, MD;  Location: [facility] ENDOSCOPY;  Service: Gastroenterology;  Laterality: N/A;   COLONOSCOPY WITH PROPOFOL N/A 07/23/2020   Procedure: COLONOSCOPY WITH PROPOFOL;  Surgeon: Lesly Rubenstein, MD;  Location: [facility] ENDOSCOPY;  Service: Endoscopy;  Laterality: N/A;   ESOPHAGOGASTRODUODENOSCOPY (EGD) WITH PROPOFOL N/A 07/23/2020   Procedure: ESOPHAGOGASTRODUODENOSCOPY (EGD) WITH PROPOFOL;  Surgeon: Lesly Rubenstein, MD;  Location: [facility] ENDOSCOPY;  Service: Endoscopy;  Laterality: N/A;   PARTIAL MASTECTOMY WITH NEEDLE LOCALIZATION AND AXILLARY SENTINEL LYMPH NODE BX Left 01/14/2019   Procedure: PARTIAL MASTECTOMY WITH NEEDLE LOCALIZATION AND AXILLARY SENTINEL LYMPH NODE BX;  Surgeon: Benjamine Sprague, DO;  Location: [facility] ORS;  Service: General;  Laterality: Left;   THYROIDECTOMY      Family History  Problem Relation Age of Onset   Diabetes Mother    Diabetes Maternal Aunt    Diabetes Maternal Uncle    Breast cancer Neg Hx     Social History:  reports that she  has never smoked. She has never used smokeless tobacco. She reports that she does not drink alcohol and does not use drugs. 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 She has two children. She lives in [place]. The patient is alone today.  Allergies:  Allergies  Allergen Reactions   Altace [Ramipril] Other (See Comments)    Angioedema   Avandia [Rosiglitazone] Other (See Comments)     Muscle Aches   Crestor  [Rosuvastatin] Other (See Comments)    Muscle ache   Doxycycline Other (See Comments)    Muscle Aches   Lipitor [Atorvastatin] Other (See Comments)    Muscle ache    Current Medications: Current Outpatient Medications  Medication Sig Dispense Refill   aspirin EC 81 MG tablet Take 81 mg by mouth daily.      glimepiride (AMARYL) 4 MG tablet Take 4 mg by mouth in the morning and at bedtime. Combines this with the 2 mg glimepiride     levothyroxine (SYNTHROID) 88 MCG tablet Take by mouth.     losartan (COZAAR) 100 MG tablet Take 100 mg by mouth every morning.      metoprolol succinate (TOPROL-XL) 25 MG 24 hr tablet Take 25 mg by mouth daily. Unsure of dose/ just started 8/20     Multiple Vitamin (MULTI-VITAMIN) tablet Take 1 tablet by mouth daily.      ONETOUCH VERIO test strip 1 each 2 (two) times daily.     simvastatin (ZOCOR) 40 MG tablet Take 40 mg by mouth daily at 6 PM.      letrozole (FEMARA) 2.5 MG tablet Take 1 tablet (2.5 mg total) by mouth daily. 90 tablet 1   metFORMIN (GLUCOPHAGE-XR) 500 MG 24 hr tablet Take 1,000 mg by mouth 2 (two) times daily.      pioglitazone (ACTOS) 15 MG tablet Take by mouth.     No current facility-administered medications for this visit.    Review of Systems  Constitutional:  Negative for chills, diaphoresis, fever, malaise/fatigue and weight loss.       Feels "okay".  HENT:  Negative for congestion, ear discharge, ear pain, hearing loss, nosebleeds, sinus pain, sore throat and tinnitus.   Eyes:  Negative for blurred vision.  Respiratory:  Negative for cough, hemoptysis, sputum production and shortness of breath.   Cardiovascular:  Negative for chest pain, palpitations, leg swelling and PND.  Gastrointestinal:  Negative for abdominal pain, blood in stool, constipation, diarrhea, heartburn, melena, nausea and vomiting.  Genitourinary:  Negative for dysuria, frequency, hematuria and urgency.  Musculoskeletal:  Negative for back pain, joint pain,  myalgias and neck pain.  Skin:  Negative for itching and rash.  Neurological:  Negative for dizziness, tingling, sensory change and headaches.  Endo/Heme/Allergies:  Does not bruise/bleed easily.  Psychiatric/Behavioral:  Negative for depression and memory loss. The patient is not nervous/anxious and does not have insomnia.   All other systems reviewed and are negative. Performance status (ECOG): 0  Vitals Blood pressure (!) 174/84, pulse 75, temperature (!) 97.2 F (36.2 C), temperature source Tympanic, weight 209 lb 1.6 oz (94.8 kg).  Physical Exam Constitutional:      General: She is not in acute distress.    Appearance: She is not diaphoretic.  HENT:     Head: Normocephalic and atraumatic.     Nose: Nose normal.     Mouth/Throat:     Pharynx: No oropharyngeal exudate.  Eyes:     General: No scleral icterus.  Pupils: Pupils are equal, round, and reactive to light.  Cardiovascular:     Rate and Rhythm: Normal rate and regular rhythm.     Heart sounds: No murmur heard. Pulmonary:     Effort: Pulmonary effort is normal. No respiratory distress.     Breath sounds: No rales.  Chest:     Chest wall: No tenderness.  Abdominal:     General: There is no distension.     Palpations: Abdomen is soft.     Tenderness: There is no abdominal tenderness.  Musculoskeletal:        General: Normal range of motion.     Cervical back: Normal range of motion and neck supple.  Skin:    General: Skin is warm and dry.     Findings: No erythema.  Neurological:     Mental Status: She is alert and oriented to person, place, and time.     Cranial Nerves: No cranial nerve deficit.     Motor: No abnormal muscle tone.     Coordination: Coordination normal.  Psychiatric:        Mood and Affect: Affect normal.    Appointment on 05/07/2021  Component Date Value Ref Range Status   Iron 05/07/2021 65  28 - 170 ug/dL Final   TIBC 05/07/2021 277  250 - 450 ug/dL Final   Saturation Ratios  05/07/2021 23  10.4 - 31.8 % Final   UIBC 05/07/2021 212  ug/dL Final   Performed at [facility], [address] [place], [place] [address]   Ferritin 05/07/2021 40  11 - 307 ng/mL Final   Performed at [facility], [address] [place], [place] [address]   WBC 05/07/2021 5.3  4.0 - 10.5 K/uL Final   RBC 05/07/2021 3.91  3.87 - 5.11 MIL/uL Final   Hemoglobin 05/07/2021 11.2 (L)  12.0 - 15.0 g/dL Final   HCT 05/07/2021 34.9 (L)  36.0 - 46.0 % Final   MCV 05/07/2021 89.3  80.0 - 100.0 fL Final   MCH 05/07/2021 28.6  26.0 - 34.0 pg Final   MCHC 05/07/2021 32.1  30.0 - 36.0 g/dL Final   RDW 05/07/2021 15.0  11.5 - 15.5 % Final   Platelets 05/07/2021 204  150 - 400 K/uL Final   nRBC 05/07/2021 0.0  0.0 - 0.2 % Final   Neutrophils Relative % 05/07/2021 62  % Final   Neutro Abs 05/07/2021 3.3  1.7 - 7.7 K/uL Final   Lymphocytes Relative 05/07/2021 25  % Final   Lymphs Abs 05/07/2021 1.4  0.7 - 4.0 K/uL Final   Monocytes Relative 05/07/2021 10  % Final   Monocytes Absolute 05/07/2021 0.5  0.1 - 1.0 K/uL Final   Eosinophils Relative 05/07/2021 2  % Final   Eosinophils Absolute 05/07/2021 0.1  0.0 - 0.5 K/uL Final   Basophils Relative 05/07/2021 1  % Final   Basophils Absolute 05/07/2021 0.0  0.0 - 0.1 K/uL Final   Immature Granulocytes 05/07/2021 0  % Final   Abs Immature Granulocytes 05/07/2021 0.02  0.00 - 0.07 K/uL Final   Performed at [facility], [address], [place], [place] [address]   Sodium 05/07/2021 139  135 - 145 mmol/L Final   Potassium 05/07/2021 4.4  3.5 - 5.1 mmol/L Final   Chloride 05/07/2021 102  98 - 111 mmol/L Final   CO2 05/07/2021 26  22 - 32 mmol/L Final   Glucose, Bld 05/07/2021 159 (H)  70 - 99  mg/dL Final   Glucose reference range applies only to samples taken after fasting for at least 8 hours.   BUN 05/07/2021 16  8 - 23 mg/dL Final   Creatinine, Ser 05/07/2021 0.94  0.44 - 1.00 mg/dL Final   Calcium 05/07/2021 9.5  8.9  - 10.3 mg/dL Final   Total Protein 05/07/2021 7.8  6.5 - 8.1 g/dL Final   Albumin 05/07/2021 3.7  3.5 - 5.0 g/dL Final   AST 05/07/2021 18  15 - 41 U/L Final   ALT 05/07/2021 14  0 - 44 U/L Final   Alkaline Phosphatase 05/07/2021 88  38 - 126 U/L Final   Total Bilirubin 05/07/2021 0.7  0.3 - 1.2 mg/dL Final   GFR, Estimated 05/07/2021 >60  >60 mL/min Final   Comment: (NOTE) Calculated using the CKD-EPI Creatinine Equation (2021)    Anion gap 05/07/2021 11  5 - 15 Final   Performed at [facility], [address] [place], [place] [address]     Assessment and plan  1. Malignant neoplasm of upper-inner quadrant of left breast in female, estrogen receptor positive ([place])   2. Aromatase inhibitor use   3. Anemia in stage 3a chronic kidney disease (HCC)    #History of stage Ia left breast cancer, ER positive, PR positive, HER2 negative. pTb pN0 Patient tolerates letrozole.  Labs reviewed and discussed with patient Continue letrozole. She is due for a bone density.  Previous bone density was done at [facility] clinic.  Primary care provider has retired. Will ask [facility] clinic endocrinology provider to see if they can prescribe bone density. Recommend patient to continue calcium and vitamin D supplement Continue annual mammogram, due in July 2022.  Patient gets mammogram ordered through surgeon's office.  [name]#History of iron deficiency anemia.  In the context of chronic kidney disease Labs are reviewed and discussed with patient.  Ferritin level is at 40, iron saturation 23.  Continue oral iron supplementation. Hemoglobin stable at 11.2.  #Chronic kidney disease.  Check SPEP.  Results are pending at time of dictation.  I discussed the assessment and treatment plan with the patient.  The patient was provided an opportunity to ask questions and all were answered.  The patient agreed with the plan and demonstrated an understanding of the instructions.  The patient was advised to  call back if the symptoms worsen or if the condition fails to improve as anticipated.  Earlie Server, MD, PhD  05/09/2021

## 2021-05-10 LAB — PROTEIN ELECTROPHORESIS, SERUM
A/G Ratio: 1 (ref 0.7–1.7)
Albumin ELP: 3.5 g/dL (ref 2.9–4.4)
Alpha-1-Globulin: 0.2 g/dL (ref 0.0–0.4)
Alpha-2-Globulin: 1.1 g/dL — ABNORMAL HIGH (ref 0.4–1.0)
Beta Globulin: 1.2 g/dL (ref 0.7–1.3)
Gamma Globulin: 0.9 g/dL (ref 0.4–1.8)
Globulin, Total: 3.4 g/dL (ref 2.2–3.9)
Total Protein ELP: 6.9 g/dL (ref 6.0–8.5)

## 2021-06-13 DIAGNOSIS — Z79811 Long term (current) use of aromatase inhibitors: Secondary | ICD-10-CM | POA: Diagnosis not present

## 2021-06-13 DIAGNOSIS — E785 Hyperlipidemia, unspecified: Secondary | ICD-10-CM | POA: Diagnosis not present

## 2021-06-13 DIAGNOSIS — Z7982 Long term (current) use of aspirin: Secondary | ICD-10-CM | POA: Diagnosis not present

## 2021-06-13 DIAGNOSIS — Z8249 Family history of ischemic heart disease and other diseases of the circulatory system: Secondary | ICD-10-CM | POA: Diagnosis not present

## 2021-06-13 DIAGNOSIS — C50919 Malignant neoplasm of unspecified site of unspecified female breast: Secondary | ICD-10-CM | POA: Diagnosis not present

## 2021-06-13 DIAGNOSIS — I10 Essential (primary) hypertension: Secondary | ICD-10-CM | POA: Diagnosis not present

## 2021-06-13 DIAGNOSIS — Z823 Family history of stroke: Secondary | ICD-10-CM | POA: Diagnosis not present

## 2021-06-13 DIAGNOSIS — Z7984 Long term (current) use of oral hypoglycemic drugs: Secondary | ICD-10-CM | POA: Diagnosis not present

## 2021-06-13 DIAGNOSIS — Z6830 Body mass index (BMI) 30.0-30.9, adult: Secondary | ICD-10-CM | POA: Diagnosis not present

## 2021-06-13 DIAGNOSIS — E119 Type 2 diabetes mellitus without complications: Secondary | ICD-10-CM | POA: Diagnosis not present

## 2021-06-13 DIAGNOSIS — E669 Obesity, unspecified: Secondary | ICD-10-CM | POA: Diagnosis not present

## 2021-06-13 DIAGNOSIS — E890 Postprocedural hypothyroidism: Secondary | ICD-10-CM | POA: Diagnosis not present

## 2021-06-17 DIAGNOSIS — H524 Presbyopia: Secondary | ICD-10-CM | POA: Diagnosis not present

## 2021-06-24 DIAGNOSIS — E1122 Type 2 diabetes mellitus with diabetic chronic kidney disease: Secondary | ICD-10-CM | POA: Diagnosis not present

## 2021-06-24 DIAGNOSIS — E1129 Type 2 diabetes mellitus with other diabetic kidney complication: Secondary | ICD-10-CM | POA: Diagnosis not present

## 2021-06-24 DIAGNOSIS — E890 Postprocedural hypothyroidism: Secondary | ICD-10-CM | POA: Diagnosis not present

## 2021-06-24 DIAGNOSIS — D649 Anemia, unspecified: Secondary | ICD-10-CM | POA: Diagnosis not present

## 2021-06-24 DIAGNOSIS — Z23 Encounter for immunization: Secondary | ICD-10-CM | POA: Diagnosis not present

## 2021-06-24 DIAGNOSIS — M19011 Primary osteoarthritis, right shoulder: Secondary | ICD-10-CM | POA: Diagnosis not present

## 2021-06-24 DIAGNOSIS — E1169 Type 2 diabetes mellitus with other specified complication: Secondary | ICD-10-CM | POA: Diagnosis not present

## 2021-06-24 DIAGNOSIS — I493 Ventricular premature depolarization: Secondary | ICD-10-CM | POA: Diagnosis not present

## 2021-06-24 DIAGNOSIS — J301 Allergic rhinitis due to pollen: Secondary | ICD-10-CM | POA: Diagnosis not present

## 2021-06-24 DIAGNOSIS — M5030 Other cervical disc degeneration, unspecified cervical region: Secondary | ICD-10-CM | POA: Diagnosis not present

## 2021-06-24 DIAGNOSIS — Z0000 Encounter for general adult medical examination without abnormal findings: Secondary | ICD-10-CM | POA: Diagnosis not present

## 2021-06-24 DIAGNOSIS — I6523 Occlusion and stenosis of bilateral carotid arteries: Secondary | ICD-10-CM | POA: Diagnosis not present

## 2021-06-24 DIAGNOSIS — I152 Hypertension secondary to endocrine disorders: Secondary | ICD-10-CM | POA: Diagnosis not present

## 2021-06-24 DIAGNOSIS — N182 Chronic kidney disease, stage 2 (mild): Secondary | ICD-10-CM | POA: Diagnosis not present

## 2021-06-24 DIAGNOSIS — E1159 Type 2 diabetes mellitus with other circulatory complications: Secondary | ICD-10-CM | POA: Diagnosis not present

## 2021-06-25 DIAGNOSIS — Z0100 Encounter for examination of eyes and vision without abnormal findings: Secondary | ICD-10-CM | POA: Diagnosis not present

## 2021-07-01 DIAGNOSIS — E890 Postprocedural hypothyroidism: Secondary | ICD-10-CM | POA: Diagnosis not present

## 2021-07-01 DIAGNOSIS — R809 Proteinuria, unspecified: Secondary | ICD-10-CM | POA: Diagnosis not present

## 2021-07-01 DIAGNOSIS — I152 Hypertension secondary to endocrine disorders: Secondary | ICD-10-CM | POA: Diagnosis not present

## 2021-07-01 DIAGNOSIS — E785 Hyperlipidemia, unspecified: Secondary | ICD-10-CM | POA: Diagnosis not present

## 2021-07-01 DIAGNOSIS — E1169 Type 2 diabetes mellitus with other specified complication: Secondary | ICD-10-CM | POA: Diagnosis not present

## 2021-07-01 DIAGNOSIS — E1129 Type 2 diabetes mellitus with other diabetic kidney complication: Secondary | ICD-10-CM | POA: Diagnosis not present

## 2021-07-01 DIAGNOSIS — E1159 Type 2 diabetes mellitus with other circulatory complications: Secondary | ICD-10-CM | POA: Diagnosis not present

## 2021-08-13 DIAGNOSIS — M1711 Unilateral primary osteoarthritis, right knee: Secondary | ICD-10-CM | POA: Diagnosis not present

## 2021-08-13 DIAGNOSIS — M222X1 Patellofemoral disorders, right knee: Secondary | ICD-10-CM | POA: Diagnosis not present

## 2021-08-13 DIAGNOSIS — R809 Proteinuria, unspecified: Secondary | ICD-10-CM | POA: Diagnosis not present

## 2021-08-13 DIAGNOSIS — M25561 Pain in right knee: Secondary | ICD-10-CM | POA: Diagnosis not present

## 2021-08-13 DIAGNOSIS — E669 Obesity, unspecified: Secondary | ICD-10-CM | POA: Diagnosis not present

## 2021-08-13 DIAGNOSIS — E1129 Type 2 diabetes mellitus with other diabetic kidney complication: Secondary | ICD-10-CM | POA: Diagnosis not present

## 2021-09-13 IMAGING — MG MM DIGITAL DIAGNOSTIC UNILAT*L* W/ TOMO W/ CAD
6 series · 6 of 18 positions shown · non-contrast
Comparison: Previous exam(s).

CLINICAL DATA: 69-year-old female status post malignant left
lumpectomy with radiation therapy in 8686. The patient's physician
palpates a new lump in the upper-outer quadrant of the left breast.

EXAM:
DIGITAL DIAGNOSTIC LEFT MAMMOGRAM WITH CAD AND TOMO
ULTRASOUND LEFT BREAST

[L MLO synth-2D]
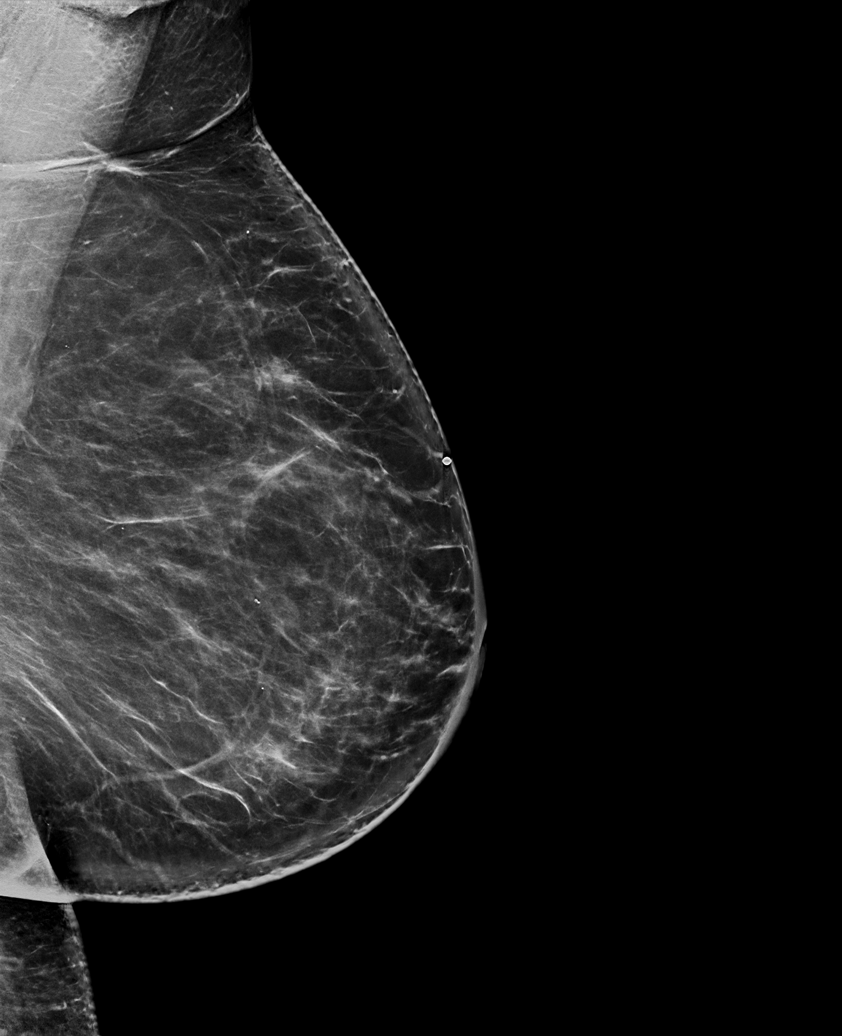

[L CC synth-2D]
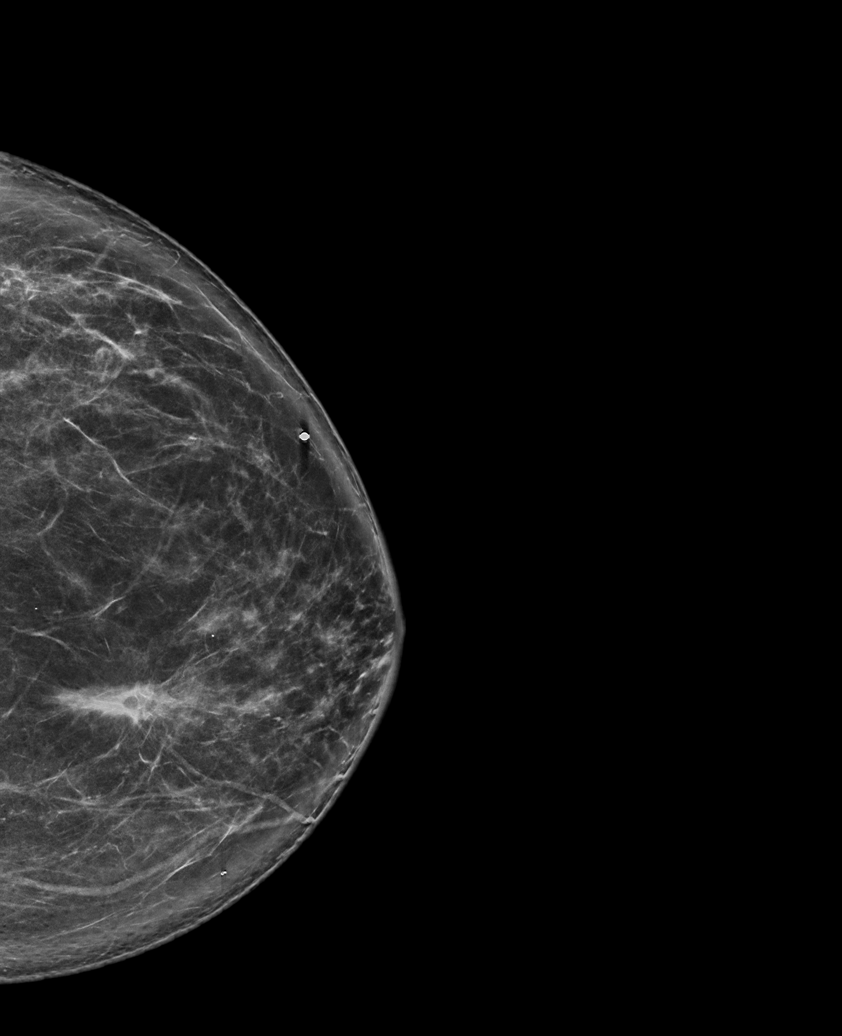

[L TAN synth-2D]
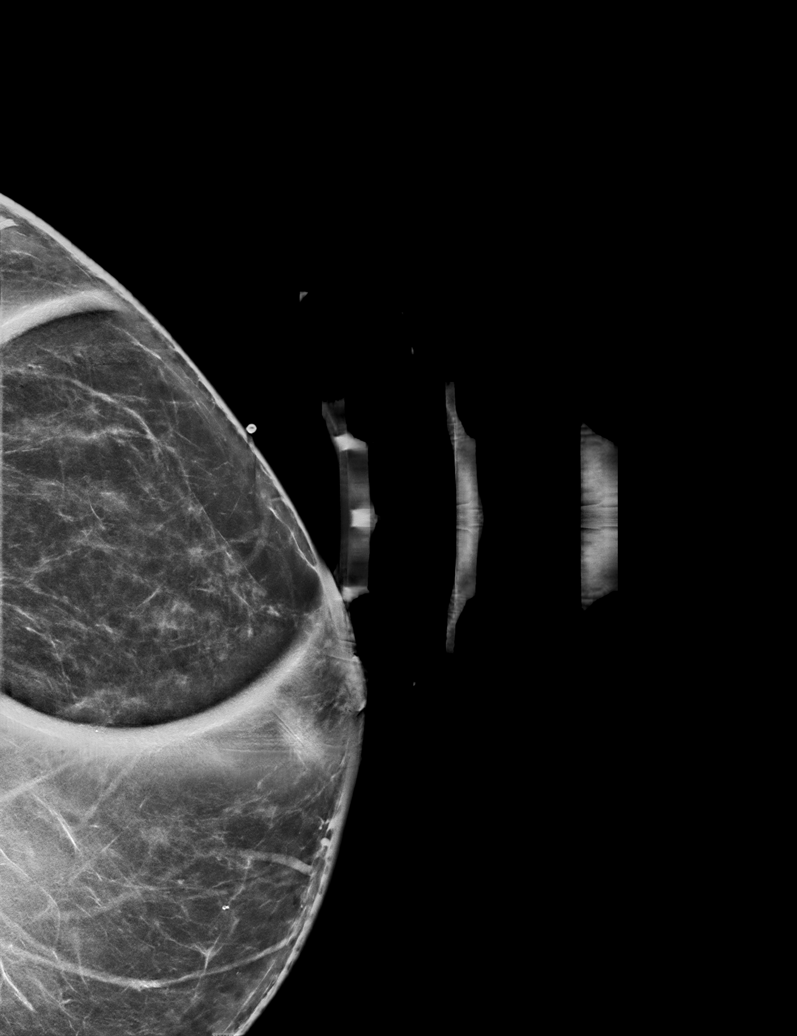

[L CC tomo · tomo slice 48/95.0]
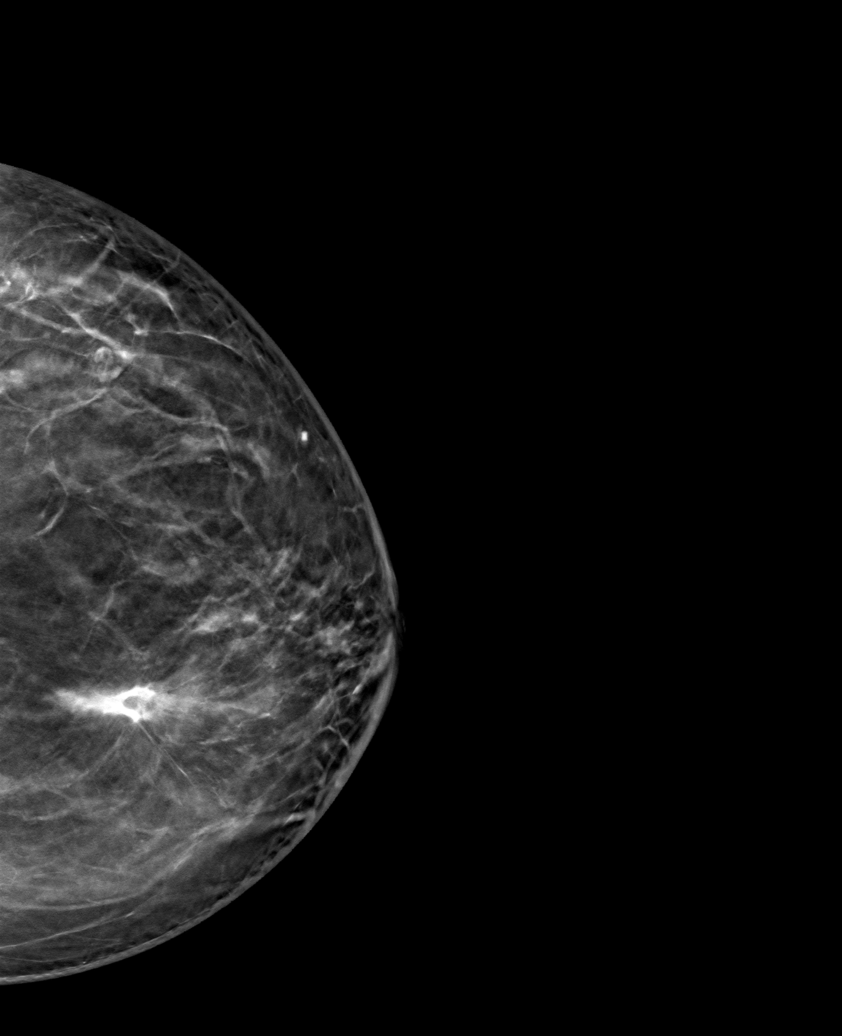

[L TAN tomo · tomo slice 39/77.0]
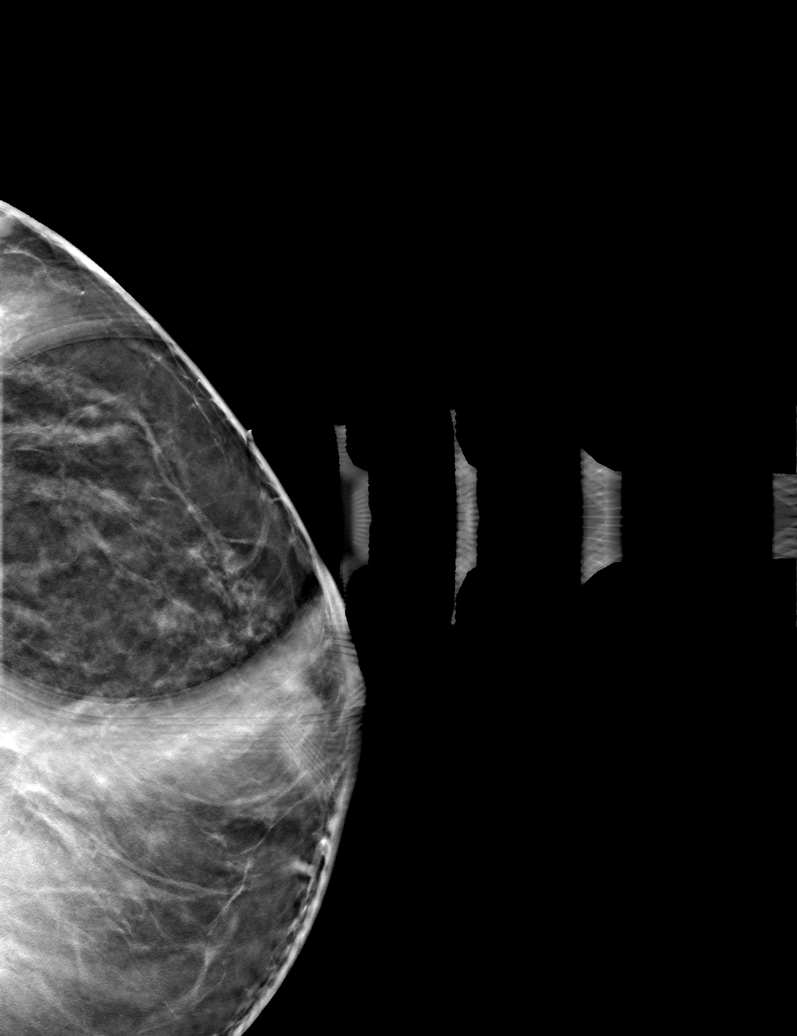

[L MLO tomo · tomo slice 47/94.0]
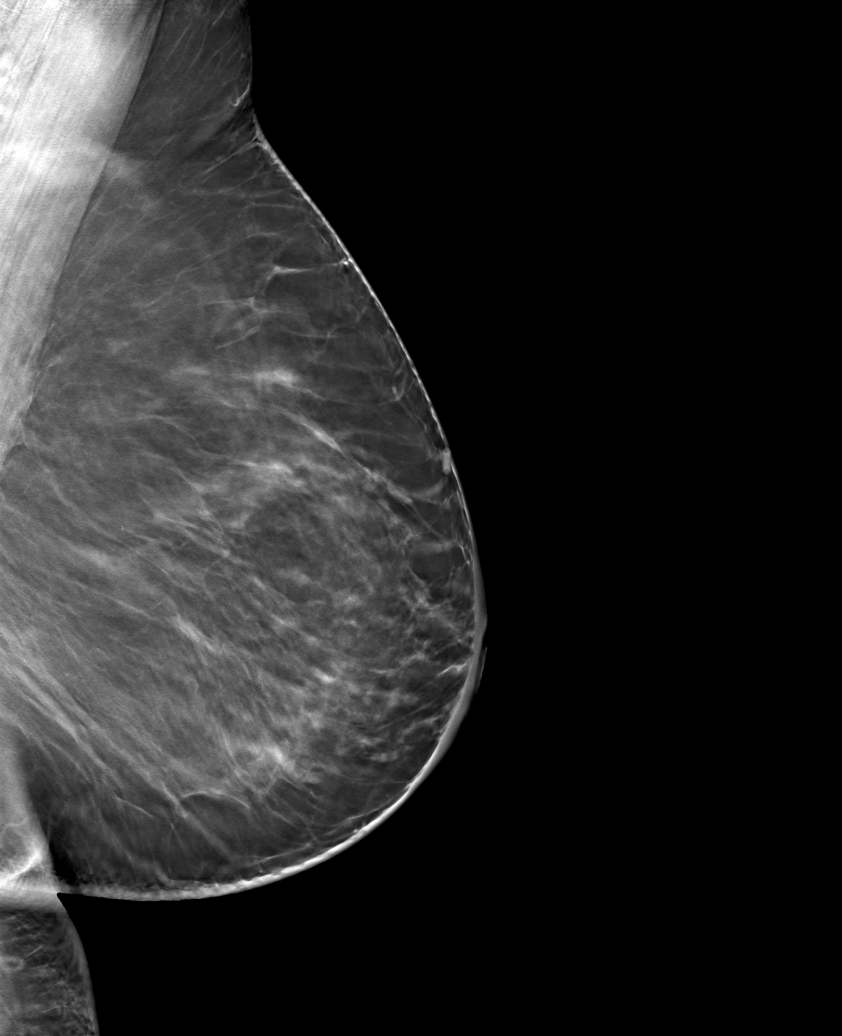

[6 of 18 positions shown; findings below may reference images not displayed]

ACR Breast Density Category b: There are scattered areas of
fibroglandular density.
FINDINGS: A radiopaque BB was placed at the site of the patient's physician
palpated lump in the upper-outer left breast. No focal or suspicious
mammographic findings are seen deep to the radiopaque BB. Stable
postsurgical changes noted medially. No additional suspicious
findings identified.

Mammographic images were processed with CAD.

Targeted ultrasound is performed, showing normal fibroglandular
tissue without focal or suspicious sonographic abnormality.
Evaluation of the entire upper outer left breast was performed.
IMPRESSION: No suspicious mammographic or sonographic findings corresponding
with the patient's left breast physician palpated lump.
Recommendation is for clinical and symptomatic follow-up.

RECOMMENDATION:
1. Clinical follow-up recommended for the palpable area of concern
in the left breast. Any further workup should be based on clinical
grounds.
2. The patient is due for annual bilateral diagnostic mammogram in
November 2020.

I have discussed the findings and recommendations with the patient.
If applicable, a reminder letter will be sent to the patient
regarding the next appointment.

BI-RADS CATEGORY  2: Benign.

## 2021-11-02 ENCOUNTER — Other Ambulatory Visit: Payer: Self-pay | Admitting: Oncology

## 2021-11-02 DIAGNOSIS — Z170 Estrogen receptor positive status [ER+]: Secondary | ICD-10-CM

## 2021-11-05 ENCOUNTER — Other Ambulatory Visit: Payer: Self-pay

## 2021-11-05 ENCOUNTER — Inpatient Hospital Stay: Payer: Medicare HMO | Attending: Oncology

## 2021-11-05 DIAGNOSIS — N182 Chronic kidney disease, stage 2 (mild): Secondary | ICD-10-CM | POA: Diagnosis not present

## 2021-11-05 DIAGNOSIS — E1122 Type 2 diabetes mellitus with diabetic chronic kidney disease: Secondary | ICD-10-CM | POA: Diagnosis not present

## 2021-11-05 DIAGNOSIS — Z90710 Acquired absence of both cervix and uterus: Secondary | ICD-10-CM | POA: Insufficient documentation

## 2021-11-05 DIAGNOSIS — Z853 Personal history of malignant neoplasm of breast: Secondary | ICD-10-CM | POA: Insufficient documentation

## 2021-11-05 DIAGNOSIS — I129 Hypertensive chronic kidney disease with stage 1 through stage 4 chronic kidney disease, or unspecified chronic kidney disease: Secondary | ICD-10-CM | POA: Insufficient documentation

## 2021-11-05 DIAGNOSIS — D631 Anemia in chronic kidney disease: Secondary | ICD-10-CM | POA: Diagnosis not present

## 2021-11-05 DIAGNOSIS — C50212 Malignant neoplasm of upper-inner quadrant of left female breast: Secondary | ICD-10-CM

## 2021-11-05 LAB — COMPREHENSIVE METABOLIC PANEL
ALT: 12 U/L (ref 0–44)
AST: 18 U/L (ref 15–41)
Albumin: 3.7 g/dL (ref 3.5–5.0)
Alkaline Phosphatase: 63 U/L (ref 38–126)
Anion gap: 8 (ref 5–15)
BUN: 26 mg/dL — ABNORMAL HIGH (ref 8–23)
CO2: 25 mmol/L (ref 22–32)
Calcium: 9.5 mg/dL (ref 8.9–10.3)
Chloride: 108 mmol/L (ref 98–111)
Creatinine, Ser: 1.36 mg/dL — ABNORMAL HIGH (ref 0.44–1.00)
GFR, Estimated: 41 mL/min — ABNORMAL LOW (ref >60)
Glucose, Bld: 87 mg/dL (ref 70–99)
Potassium: 4.6 mmol/L (ref 3.5–5.1)
Sodium: 141 mmol/L (ref 135–145)
Total Bilirubin: 0.4 mg/dL (ref 0.3–1.2)
Total Protein: 7.6 g/dL (ref 6.5–8.1)

## 2021-11-05 LAB — CBC WITH DIFFERENTIAL/PLATELET
Abs Immature Granulocytes: 0.01 10*3/uL (ref 0.00–0.07)
Basophils Absolute: 0 10*3/uL (ref 0.0–0.1)
Basophils Relative: 1 %
Eosinophils Absolute: 0.1 10*3/uL (ref 0.0–0.5)
Eosinophils Relative: 2 %
HCT: 36 % (ref 36.0–46.0)
Hemoglobin: 11.2 g/dL — ABNORMAL LOW (ref 12.0–15.0)
Immature Granulocytes: 0 %
Lymphocytes Relative: 28 %
Lymphs Abs: 1.2 10*3/uL (ref 0.7–4.0)
MCH: 28.4 pg (ref 26.0–34.0)
MCHC: 31.1 g/dL (ref 30.0–36.0)
MCV: 91.4 fL (ref 80.0–100.0)
Monocytes Absolute: 0.4 10*3/uL (ref 0.1–1.0)
Monocytes Relative: 9 %
Neutro Abs: 2.6 10*3/uL (ref 1.7–7.7)
Neutrophils Relative %: 60 %
Platelets: 192 10*3/uL (ref 150–400)
RBC: 3.94 MIL/uL (ref 3.87–5.11)
RDW: 15.5 % (ref 11.5–15.5)
WBC: 4.2 10*3/uL (ref 4.0–10.5)
nRBC: 0 % (ref 0.0–0.2)

## 2021-11-05 LAB — IRON AND TIBC
Iron: 60 ug/dL (ref 28–170)
Saturation Ratios: 18 % (ref 10.4–31.8)
TIBC: 340 ug/dL (ref 250–450)
UIBC: 280 ug/dL

## 2021-11-05 LAB — FERRITIN: Ferritin: 27 ng/mL (ref 11–307)

## 2021-11-06 LAB — CANCER ANTIGEN 27.29: CA 27.29: 14.7 U/mL (ref 0.0–38.6)

## 2021-11-07 ENCOUNTER — Encounter: Payer: Self-pay | Admitting: Oncology

## 2021-11-07 ENCOUNTER — Inpatient Hospital Stay: Payer: Medicare HMO | Admitting: Oncology

## 2021-11-07 ENCOUNTER — Telehealth: Payer: Self-pay

## 2021-11-07 VITALS — BP 142/67 | HR 59 | Temp 96.1°F | Resp 18 | Wt 202.0 lb

## 2021-11-07 DIAGNOSIS — Z79811 Long term (current) use of aromatase inhibitors: Secondary | ICD-10-CM

## 2021-11-07 DIAGNOSIS — N182 Chronic kidney disease, stage 2 (mild): Secondary | ICD-10-CM | POA: Diagnosis not present

## 2021-11-07 DIAGNOSIS — C50212 Malignant neoplasm of upper-inner quadrant of left female breast: Secondary | ICD-10-CM | POA: Diagnosis not present

## 2021-11-07 DIAGNOSIS — N1832 Chronic kidney disease, stage 3b: Secondary | ICD-10-CM | POA: Diagnosis not present

## 2021-11-07 DIAGNOSIS — D631 Anemia in chronic kidney disease: Secondary | ICD-10-CM | POA: Diagnosis not present

## 2021-11-07 DIAGNOSIS — Z853 Personal history of malignant neoplasm of breast: Secondary | ICD-10-CM | POA: Diagnosis not present

## 2021-11-07 DIAGNOSIS — N1831 Chronic kidney disease, stage 3a: Secondary | ICD-10-CM | POA: Insufficient documentation

## 2021-11-07 DIAGNOSIS — Z170 Estrogen receptor positive status [ER+]: Secondary | ICD-10-CM | POA: Diagnosis not present

## 2021-11-07 DIAGNOSIS — I129 Hypertensive chronic kidney disease with stage 1 through stage 4 chronic kidney disease, or unspecified chronic kidney disease: Secondary | ICD-10-CM | POA: Diagnosis not present

## 2021-11-07 DIAGNOSIS — Z90710 Acquired absence of both cervix and uterus: Secondary | ICD-10-CM | POA: Diagnosis not present

## 2021-11-07 DIAGNOSIS — E1122 Type 2 diabetes mellitus with diabetic chronic kidney disease: Secondary | ICD-10-CM | POA: Diagnosis not present

## 2021-11-07 DIAGNOSIS — N183 Chronic kidney disease, stage 3 unspecified: Secondary | ICD-10-CM | POA: Insufficient documentation

## 2021-11-07 NOTE — Assessment & Plan Note (Addendum)
Continue calcium and vitamin D supplementation. She has DEXA done, result is not available to me.

## 2021-11-07 NOTE — Telephone Encounter (Signed)
Called Dr. Sherren Mocha office in [place] to request patients DEXA scan to be faxed. Caylee made a note for provider and nurse, to be faxed.

## 2021-11-07 NOTE — Assessment & Plan Note (Addendum)
History of stage Ia left breast cancer, ER positive, PR positive, HER2 negative. pTb pN0 Labs reviewed and discussed with patient Continue letrozole. Obtain annual mammogram, due in July 2022.

## 2021-11-07 NOTE — Assessment & Plan Note (Signed)
Labs reviewed and discussed with patient Continue oral iron supplementation

## 2021-11-07 NOTE — Progress Notes (Signed)
Clinic Day:  11/07/2021  ASSESSMENT & PLAN:   Malignant neoplasm of upper-inner quadrant of left breast in female, estrogen receptor positive ([place]) History of stage Ia left breast cancer, ER positive, PR positive, HER2 negative. pTb pN0 Labs reviewed and discussed with patient Continue letrozole. Obtain annual mammogram, due in July 2022.   Anemia in chronic kidney disease (CKD) Labs reviewed and discussed with patient Continue oral iron supplementation  Aromatase inhibitor use Continue calcium and vitamin D supplementation. She has DEXA done, result is not available to me.   Orders Placed This Encounter  Procedures   MM 3D SCREEN BREAST BILATERAL    Standing Status:   Future    Standing Expiration Date:   11/08/2022    Order Specific Question:   Reason for Exam (SYMPTOM  OR DIAGNOSIS REQUIRED)    Answer:   history of breast cancer    Order Specific Question:   Preferred imaging location?    Answer:   [facility]   CBC with Differential/Platelet    Standing Status:   Future    Standing Expiration Date:   11/08/2022   Iron and TIBC    Standing Status:   Future    Standing Expiration Date:   11/08/2022   Comprehensive metabolic panel    Standing Status:   Future    Standing Expiration Date:   11/08/2022   Ferritin    Standing Status:   Future    Standing Expiration Date:   11/08/2022   Follow up Lab in 6 months, cbc cmp iron tibc ferritin prior to MD + Breast exam All questions were answered. The patient knows to call the clinic with any problems, questions or concerns.  Earlie Server, MD, PhD [facility] Hematology Oncology 11/07/2021    Chief Complaint: Caitlin Ford is a 71 y.o. female presents for follow up of stage I left breast cancer and iron deficiency  PERTINENT HEMATOLOGY HISTORY Patient previously followed up by Dr.Corcoran, patient switched care to me on 01/03/21 Extensive medical record review was performed by me  Oncology History  Malignant  neoplasm of upper-inner quadrant of left breast in female, estrogen receptor positive ([place])  10/21/2018 Imaging   DEXA normal with a T-score of +0.3 in the left femoral neck and + 1.3 in AP spine L1-L4.   01/14/2019 Initial Diagnosis   Malignant neoplasm of upper-inner quadrant of left breast in female, estrogen receptor positive  - #01/14/2019.  Stage IA left breast cancer s/p lumpectomy and axillary lymph node biopsy. 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  Two sentinel lymph nodes negative for malignancy.  Tumor was ER + >90%, PR + >90%, and HER-2/neu negative (1+).  Pathologic stage was pT1b pN0.    02/16/2019 - 03/21/2019 Radiation Therapy   Adjuvant radiation   03/31/2019 -  Anti-estrogen oral therapy   Started on letrozole   11/29/2020 Mammogram   Bilateral diagnostic mammogram showed No mammographic evidence for malignancy. Stable lumpectomy changes left breast.    # iron deficiency anemia.   Gastroenterology work-up EGD on 07/23/2020 revealed erosive gastropathy with no stigmata of recent bleeding. Colonoscopy on 07/23/2020 revealed diverticulosis in the sigmoid colon, descending colon and ascending colon. There were non-bleeding internal hemorrhoids.   INTERVAL HISTORY Caitlin Ford is a 71 y.o. female who has above history reviewed by me today presents for follow up visit for history of stage Ia left breast cancer, and iron deficiency  anemia Ports feeling well.  No new complaints.  She tolerates letrozole. Patient has had a repeat bone density done at [facility] clinic.  Results were not available to me yet.  Past Medical History:  Diagnosis Date   Arthritis    Breast cancer ([place])    Chronic kidney disease    Chronic Renal Insufficiency - Stage 2   DDD (degenerative disc disease), cervical    Diabetes mellitus without complication (HCC)    GERD (gastroesophageal reflux disease)    occ no meds   Goiter     Headache    simus   Hypercholesteremia    Hypertension    Hypothyroidism    Malignant neoplasm of upper-inner quadrant of left breast in female, estrogen receptor positive ([place]) 02/04/2019   Personal history of radiation therapy    Proteinuria    Wears dentures    partial upper, full lower    Past Surgical History:  Procedure Laterality Date   ABDOMINAL HYSTERECTOMY     BREAST BIOPSY Left 12/14/2018   [facility]   BREAST LUMPECTOMY Left 01/14/2019   [facility]   COLONOSCOPY WITH PROPOFOL N/A 02/09/2015   Procedure: COLONOSCOPY WITH PROPOFOL;  Surgeon: Hulen Luster, MD;  Location: [facility] ENDOSCOPY;  Service: Gastroenterology;  Laterality: N/A;   COLONOSCOPY WITH PROPOFOL N/A 07/23/2020   Procedure: COLONOSCOPY WITH PROPOFOL;  Surgeon: Lesly Rubenstein, MD;  Location: [facility] ENDOSCOPY;  Service: Endoscopy;  Laterality: N/A;   ESOPHAGOGASTRODUODENOSCOPY (EGD) WITH PROPOFOL N/A 07/23/2020   Procedure: ESOPHAGOGASTRODUODENOSCOPY (EGD) WITH PROPOFOL;  Surgeon: Lesly Rubenstein, MD;  Location: [facility] ENDOSCOPY;  Service: Endoscopy;  Laterality: N/A;   PARTIAL MASTECTOMY WITH NEEDLE LOCALIZATION AND AXILLARY SENTINEL LYMPH NODE BX Left 01/14/2019   Procedure: PARTIAL MASTECTOMY WITH NEEDLE LOCALIZATION AND AXILLARY SENTINEL LYMPH NODE BX;  Surgeon: Benjamine Sprague, DO;  Location: [facility] ORS;  Service: General;  Laterality: Left;   THYROIDECTOMY      Family History  Problem Relation Age of Onset   Diabetes Mother    Diabetes Maternal Aunt    Diabetes Maternal Uncle    Breast cancer Neg Hx     Social History:  reports that she has never smoked. She has never used smokeless tobacco. She reports that she does not drink alcohol and does not use drugs.  Allergies:  Allergies  Allergen Reactions   Altace [Ramipril] Other (See Comments)    Angioedema   Avandia [Rosiglitazone] Other (See Comments)     Muscle Aches   Crestor [Rosuvastatin] Other (See Comments)    Muscle ache   Doxycycline Other (See Comments)     Muscle Aches   Lipitor [Atorvastatin] Other (See Comments)    Muscle ache    Current Medications: Current Outpatient Medications  Medication Sig Dispense Refill   aspirin EC 81 MG tablet Take 81 mg by mouth daily.      glimepiride (AMARYL) 4 MG tablet Take 4 mg by mouth in the morning and at bedtime. Combines this with the 2 mg glimepiride     letrozole (FEMARA) 2.5 MG tablet TAKE 1 TABLET BY MOUTH EVERY DAY 90 tablet 0   levothyroxine (SYNTHROID) 88 MCG tablet Take by mouth.     losartan (COZAAR) 100 MG tablet Take 100 mg by mouth every morning.      metFORMIN (GLUCOPHAGE-XR) 500 MG 24 hr tablet Take 1,000 mg by mouth 2 (two) times daily.      metoprolol succinate (TOPROL-XL) 25 MG 24 hr tablet Take 25 mg by mouth daily. Unsure of  dose/ just started 8/20     Multiple Vitamin (MULTI-VITAMIN) tablet Take 1 tablet by mouth daily.      ONETOUCH VERIO test strip 1 each 2 (two) times daily.     pioglitazone (ACTOS) 15 MG tablet Take by mouth.     simvastatin (ZOCOR) 40 MG tablet Take 40 mg by mouth daily at 6 PM.      No current facility-administered medications for this visit.   Review of Systems  Constitutional:  Negative for appetite change, chills, fatigue and fever.  HENT:   Negative for hearing loss and voice change.   Eyes:  Negative for eye problems.  Respiratory:  Negative for chest tightness and cough.   Cardiovascular:  Negative for chest pain.  Gastrointestinal:  Negative for abdominal distention, abdominal pain and blood in stool.  Endocrine: Negative for hot flashes.  Genitourinary:  Negative for difficulty urinating and frequency.   Musculoskeletal:  Negative for arthralgias.  Skin:  Negative for itching and rash.  Neurological:  Negative for extremity weakness.  Hematological:  Negative for adenopathy.  Psychiatric/Behavioral:  Negative for confusion.     Performance status (ECOG): 0  Vitals Blood pressure (!) 142/67, pulse (!) 59, temperature (!) 96.1 F (35.6  C), resp. rate 18, weight 202 lb (91.6 kg).  Physical Exam Constitutional:      General: She is not in acute distress.    Appearance: She is not diaphoretic.  HENT:     Head: Normocephalic and atraumatic.     Nose: Nose normal.     Mouth/Throat:     Pharynx: No oropharyngeal exudate.  Eyes:     General: No scleral icterus.    Pupils: Pupils are equal, round, and reactive to light.  Cardiovascular:     Rate and Rhythm: Normal rate and regular rhythm.     Heart sounds: No murmur heard. Pulmonary:     Effort: Pulmonary effort is normal. No respiratory distress.     Breath sounds: No rales.  Chest:     Chest wall: No tenderness.  Abdominal:     General: There is no distension.     Palpations: Abdomen is soft.     Tenderness: There is no abdominal tenderness.  Musculoskeletal:        General: Normal range of motion.     Cervical back: Normal range of motion and neck supple.  Skin:    General: Skin is warm and dry.     Findings: No erythema.  Neurological:     Mental Status: She is alert and oriented to person, place, and time.     Cranial Nerves: No cranial nerve deficit.     Motor: No abnormal muscle tone.     Coordination: Coordination normal.  Psychiatric:        Mood and Affect: Affect normal.    Labs are reviewed and discussed with patient.    Latest Ref Rng & Units 11/05/2021   10:33 AM 05/07/2021    9:54 AM 01/03/2021    2:01 PM  CBC  WBC 4.0 - 10.5 K/uL 4.2  5.3  4.3   Hemoglobin 12.0 - 15.0 g/dL 11.2  11.2  11.4   Hematocrit 36.0 - 46.0 % 36.0  34.9  35.0   Platelets 150 - 400 K/uL 192  204  214        Latest Ref Rng & Units 11/05/2021   10:33 AM 05/07/2021    9:54 AM 01/03/2021    2:01 PM  CMP  Glucose  70 - 99 mg/dL 87  159  164   BUN 8 - 23 mg/dL _0 Creatinine 0.44 - 1.00 mg/dL 1.36  0.94  1.27   Sodium 135 - 145 mmol/L 141  139  137   Potassium 3.5 - 5.1 mmol/L 4.6  4.4  5.1   Chloride 98 - 111 mmol/L 108  102  106   CO2 22 - 32 mmol/L _1 Calcium 8.9 - 10.3 mg/dL 9.5  9.5  9.4   Total Protein 6.5 - 8.1 g/dL 7.6  7.8  7.6   Total Bilirubin 0.3 - 1.2 mg/dL 0.4  0.7  0.5   Alkaline Phos 38 - 126 U/L 63  88  75   AST 15 - 41 U/L _2 ALT 0 - 44 U/L _3 Iron/TIBC/Ferritin/ %Sat    Component Value Date/Time   IRON 60 11/05/2021 1033   TIBC 340 11/05/2021 1033   FERRITIN 27 11/05/2021 1033   IRONPCTSAT 18 11/05/2021 1033

## 2021-11-08 ENCOUNTER — Encounter: Payer: Self-pay | Admitting: Endocrinology

## 2021-11-08 NOTE — Telephone Encounter (Signed)
Bone density results scanned in media

## 2021-11-18 DIAGNOSIS — E1159 Type 2 diabetes mellitus with other circulatory complications: Secondary | ICD-10-CM | POA: Diagnosis not present

## 2021-11-18 DIAGNOSIS — E890 Postprocedural hypothyroidism: Secondary | ICD-10-CM | POA: Diagnosis not present

## 2021-11-18 DIAGNOSIS — I152 Hypertension secondary to endocrine disorders: Secondary | ICD-10-CM | POA: Diagnosis not present

## 2021-11-18 DIAGNOSIS — E1169 Type 2 diabetes mellitus with other specified complication: Secondary | ICD-10-CM | POA: Diagnosis not present

## 2021-11-18 DIAGNOSIS — E785 Hyperlipidemia, unspecified: Secondary | ICD-10-CM | POA: Diagnosis not present

## 2021-11-18 DIAGNOSIS — E1129 Type 2 diabetes mellitus with other diabetic kidney complication: Secondary | ICD-10-CM | POA: Diagnosis not present

## 2021-11-18 DIAGNOSIS — R809 Proteinuria, unspecified: Secondary | ICD-10-CM | POA: Diagnosis not present

## 2021-12-02 ENCOUNTER — Ambulatory Visit
Admission: RE | Admit: 2021-12-02 | Discharge: 2021-12-02 | Disposition: A | Discharge status: Home / Self Care | Payer: Medicare HMO | Source: Ambulatory Visit | Attending: Oncology | Admitting: Oncology

## 2021-12-02 DIAGNOSIS — Z1231 Encounter for screening mammogram for malignant neoplasm of breast: Secondary | ICD-10-CM | POA: Diagnosis not present

## 2021-12-02 DIAGNOSIS — Z853 Personal history of malignant neoplasm of breast: Secondary | ICD-10-CM | POA: Diagnosis not present

## 2021-12-02 DIAGNOSIS — Z170 Estrogen receptor positive status [ER+]: Secondary | ICD-10-CM

## 2022-01-08 DIAGNOSIS — R809 Proteinuria, unspecified: Secondary | ICD-10-CM | POA: Diagnosis not present

## 2022-01-08 DIAGNOSIS — E1129 Type 2 diabetes mellitus with other diabetic kidney complication: Secondary | ICD-10-CM | POA: Diagnosis not present

## 2022-01-08 DIAGNOSIS — I152 Hypertension secondary to endocrine disorders: Secondary | ICD-10-CM | POA: Diagnosis not present

## 2022-01-08 DIAGNOSIS — I493 Ventricular premature depolarization: Secondary | ICD-10-CM | POA: Diagnosis not present

## 2022-01-08 DIAGNOSIS — E1159 Type 2 diabetes mellitus with other circulatory complications: Secondary | ICD-10-CM | POA: Diagnosis not present

## 2022-01-08 DIAGNOSIS — E782 Mixed hyperlipidemia: Secondary | ICD-10-CM | POA: Diagnosis not present

## 2022-01-08 DIAGNOSIS — I10 Essential (primary) hypertension: Secondary | ICD-10-CM | POA: Diagnosis not present

## 2022-01-08 DIAGNOSIS — Z853 Personal history of malignant neoplasm of breast: Secondary | ICD-10-CM | POA: Diagnosis not present

## 2022-01-08 DIAGNOSIS — E1169 Type 2 diabetes mellitus with other specified complication: Secondary | ICD-10-CM | POA: Diagnosis not present

## 2022-01-08 DIAGNOSIS — Z09 Encounter for follow-up examination after completed treatment for conditions other than malignant neoplasm: Secondary | ICD-10-CM | POA: Diagnosis not present

## 2022-01-08 DIAGNOSIS — Z23 Encounter for immunization: Secondary | ICD-10-CM | POA: Diagnosis not present

## 2022-02-05 ENCOUNTER — Other Ambulatory Visit: Payer: Self-pay | Admitting: Oncology

## 2022-02-05 DIAGNOSIS — C50212 Malignant neoplasm of upper-inner quadrant of left female breast: Secondary | ICD-10-CM

## 2022-02-24 DIAGNOSIS — J301 Allergic rhinitis due to pollen: Secondary | ICD-10-CM | POA: Diagnosis not present

## 2022-02-24 DIAGNOSIS — J029 Acute pharyngitis, unspecified: Secondary | ICD-10-CM | POA: Diagnosis not present

## 2022-03-07 DIAGNOSIS — R809 Proteinuria, unspecified: Secondary | ICD-10-CM | POA: Diagnosis not present

## 2022-03-07 DIAGNOSIS — E1169 Type 2 diabetes mellitus with other specified complication: Secondary | ICD-10-CM | POA: Diagnosis not present

## 2022-03-07 DIAGNOSIS — E782 Mixed hyperlipidemia: Secondary | ICD-10-CM | POA: Diagnosis not present

## 2022-03-07 DIAGNOSIS — E1129 Type 2 diabetes mellitus with other diabetic kidney complication: Secondary | ICD-10-CM | POA: Diagnosis not present

## 2022-03-07 DIAGNOSIS — E1165 Type 2 diabetes mellitus with hyperglycemia: Secondary | ICD-10-CM | POA: Diagnosis not present

## 2022-03-07 DIAGNOSIS — E1159 Type 2 diabetes mellitus with other circulatory complications: Secondary | ICD-10-CM | POA: Diagnosis not present

## 2022-03-07 DIAGNOSIS — I152 Hypertension secondary to endocrine disorders: Secondary | ICD-10-CM | POA: Diagnosis not present

## 2022-03-25 DIAGNOSIS — E1159 Type 2 diabetes mellitus with other circulatory complications: Secondary | ICD-10-CM | POA: Diagnosis not present

## 2022-03-25 DIAGNOSIS — I493 Ventricular premature depolarization: Secondary | ICD-10-CM | POA: Diagnosis not present

## 2022-03-25 DIAGNOSIS — I152 Hypertension secondary to endocrine disorders: Secondary | ICD-10-CM | POA: Diagnosis not present

## 2022-03-25 DIAGNOSIS — E782 Mixed hyperlipidemia: Secondary | ICD-10-CM | POA: Diagnosis not present

## 2022-03-25 DIAGNOSIS — E1169 Type 2 diabetes mellitus with other specified complication: Secondary | ICD-10-CM | POA: Diagnosis not present

## 2022-05-09 ENCOUNTER — Other Ambulatory Visit: Payer: Medicare HMO

## 2022-05-09 ENCOUNTER — Ambulatory Visit: Payer: Medicare HMO | Admitting: Oncology

## 2022-05-12 ENCOUNTER — Other Ambulatory Visit: Payer: Self-pay | Admitting: Oncology

## 2022-05-12 DIAGNOSIS — C50212 Malignant neoplasm of upper-inner quadrant of left female breast: Secondary | ICD-10-CM

## 2022-05-16 ENCOUNTER — Encounter: Payer: Self-pay | Admitting: Oncology

## 2022-05-16 ENCOUNTER — Inpatient Hospital Stay (HOSPITAL_BASED_OUTPATIENT_CLINIC_OR_DEPARTMENT_OTHER): Payer: Medicare HMO | Admitting: Oncology

## 2022-05-16 ENCOUNTER — Inpatient Hospital Stay: Payer: Medicare HMO | Attending: Oncology

## 2022-05-16 VITALS — BP 141/90 | HR 75 | Temp 98.5°F | Resp 18 | Wt 205.6 lb

## 2022-05-16 DIAGNOSIS — N1831 Chronic kidney disease, stage 3a: Secondary | ICD-10-CM | POA: Diagnosis not present

## 2022-05-16 DIAGNOSIS — Z79811 Long term (current) use of aromatase inhibitors: Secondary | ICD-10-CM

## 2022-05-16 DIAGNOSIS — C50212 Malignant neoplasm of upper-inner quadrant of left female breast: Secondary | ICD-10-CM | POA: Diagnosis not present

## 2022-05-16 DIAGNOSIS — Z170 Estrogen receptor positive status [ER+]: Secondary | ICD-10-CM | POA: Insufficient documentation

## 2022-05-16 DIAGNOSIS — E1122 Type 2 diabetes mellitus with diabetic chronic kidney disease: Secondary | ICD-10-CM | POA: Diagnosis not present

## 2022-05-16 DIAGNOSIS — D631 Anemia in chronic kidney disease: Secondary | ICD-10-CM | POA: Insufficient documentation

## 2022-05-16 DIAGNOSIS — Z90710 Acquired absence of both cervix and uterus: Secondary | ICD-10-CM | POA: Insufficient documentation

## 2022-05-16 DIAGNOSIS — I129 Hypertensive chronic kidney disease with stage 1 through stage 4 chronic kidney disease, or unspecified chronic kidney disease: Secondary | ICD-10-CM | POA: Diagnosis not present

## 2022-05-16 DIAGNOSIS — N1832 Chronic kidney disease, stage 3b: Secondary | ICD-10-CM

## 2022-05-16 LAB — COMPREHENSIVE METABOLIC PANEL
ALT: 17 U/L (ref 0–44)
AST: 20 U/L (ref 15–41)
Albumin: 3.7 g/dL (ref 3.5–5.0)
Alkaline Phosphatase: 89 U/L (ref 38–126)
Anion gap: 9 (ref 5–15)
BUN: 20 mg/dL (ref 8–23)
CO2: 24 mmol/L (ref 22–32)
Calcium: 9.1 mg/dL (ref 8.9–10.3)
Chloride: 106 mmol/L (ref 98–111)
Creatinine, Ser: 1.15 mg/dL — ABNORMAL HIGH (ref 0.44–1.00)
GFR, Estimated: 51 mL/min — ABNORMAL LOW (ref >60)
Glucose, Bld: 176 mg/dL — ABNORMAL HIGH (ref 70–99)
Potassium: 4.6 mmol/L (ref 3.5–5.1)
Sodium: 139 mmol/L (ref 135–145)
Total Bilirubin: 0.3 mg/dL (ref 0.3–1.2)
Total Protein: 7.7 g/dL (ref 6.5–8.1)

## 2022-05-16 LAB — CBC WITH DIFFERENTIAL/PLATELET
Abs Immature Granulocytes: 0.02 10*3/uL (ref 0.00–0.07)
Basophils Absolute: 0 10*3/uL (ref 0.0–0.1)
Basophils Relative: 1 %
Eosinophils Absolute: 0.1 10*3/uL (ref 0.0–0.5)
Eosinophils Relative: 2 %
HCT: 34.4 % — ABNORMAL LOW (ref 36.0–46.0)
Hemoglobin: 11 g/dL — ABNORMAL LOW (ref 12.0–15.0)
Immature Granulocytes: 0 %
Lymphocytes Relative: 25 %
Lymphs Abs: 1.4 10*3/uL (ref 0.7–4.0)
MCH: 28.1 pg (ref 26.0–34.0)
MCHC: 32 g/dL (ref 30.0–36.0)
MCV: 87.8 fL (ref 80.0–100.0)
Monocytes Absolute: 0.5 10*3/uL (ref 0.1–1.0)
Monocytes Relative: 9 %
Neutro Abs: 3.6 10*3/uL (ref 1.7–7.7)
Neutrophils Relative %: 63 %
Platelets: 221 10*3/uL (ref 150–400)
RBC: 3.92 MIL/uL (ref 3.87–5.11)
RDW: 14.5 % (ref 11.5–15.5)
WBC: 5.7 10*3/uL (ref 4.0–10.5)
nRBC: 0 % (ref 0.0–0.2)

## 2022-05-16 LAB — IRON AND TIBC
Iron: 47 ug/dL (ref 28–170)
Saturation Ratios: 16 % (ref 10.4–31.8)
TIBC: 295 ug/dL (ref 250–450)
UIBC: 248 ug/dL

## 2022-05-16 LAB — FERRITIN: Ferritin: 34 ng/mL (ref 11–307)

## 2022-05-16 MED ORDER — LETROZOLE 2.5 MG PO TABS: 2.5000 mg | ORAL_TABLET | Freq: Every day | ORAL | 1 refills | Status: DC

## 2022-05-16 NOTE — Assessment & Plan Note (Signed)
History of stage IA left breast cancer, ER positive, PR positive, HER2 negative. pTb pN0 On Letrozole since 03/31/2019 Labs reviewed and discussed with patient Continue letrozole 2.73m daily Obtain annual mammogram, due in July 2024.

## 2022-05-18 NOTE — Assessment & Plan Note (Signed)
Continue calcium and vitamin D supplementation. She has DEXA done, normal result.

## 2022-05-18 NOTE — Progress Notes (Signed)
Clinic Day:  05/18/2022  ASSESSMENT & PLAN:   Cancer Staging  Malignant neoplasm of upper-inner quadrant of left breast in female, estrogen receptor positive ([place]) Staging form: Breast, AJCC 8th Edition - Pathologic stage from 01/14/2019: Stage IA (pT1b, pN0, cM0, G1, ER+, PR+, HER2-) - Signed by Caitlin Server, MD on 05/18/2022   Malignant neoplasm of upper-inner quadrant of left breast in female, estrogen receptor positive ([place]) History of stage IA left breast cancer, ER positive, PR positive, HER2 negative. pTb pN0 On Letrozole since 03/31/2019 Labs reviewed and discussed with patient Continue letrozole 2.87m daily Obtain annual mammogram, due in July 2024.  Anemia in chronic kidney disease (CKD) Labs reviewed and discussed with patient, stable hemoglobin and iron panel. Continue oral iron supplementation once daily.   Aromatase inhibitor use Continue calcium and vitamin D supplementation. She has DEXA done, normal result.   Stage 3a chronic kidney disease (CKD) ([place] Encourage oral hydration and avoid nephrotoxins.    Orders Placed This Encounter  Procedures   MM 3D SCREEN BREAST BILATERAL    Standing Status:   Future    Standing Expiration Date:   05/17/2023    Order Specific Question:   Reason for Exam (SYMPTOM  OR DIAGNOSIS REQUIRED)    Answer:   history of breast cancer    Order Specific Question:   Preferred imaging location?    Answer:   [facility]   CBC with Differential/Platelet    Standing Status:   Future    Standing Expiration Date:   05/17/2023   Comprehensive metabolic panel    Standing Status:   Future    Standing Expiration Date:   05/16/2023   Follow up Lab in 6 months All questions were answered. The patient knows to call the clinic with any problems, questions or concerns.  ZEarlie Server MD, PhD [facility] H[facility] 05/16/2022    Chief Complaint: WRusty Glodowskiis a 71y.o. female presents for follow up of stage I left breast  cancer and iron deficiency  PERTINENT HEMATOLOGY HISTORY Patient previously followed up by Dr.Corcoran, patient switched care to me on 01/03/21 Extensive medical record review was performed by me  Oncology History  Malignant neoplasm of upper-inner quadrant of left breast in female, estrogen receptor positive ([place]  10/21/2018 Imaging   DEXA normal with a T-score of +0.3 in the left femoral neck and + 1.3 in AP spine L1-L4.   01/14/2019 Initial Diagnosis   Malignant neoplasm of upper-inner quadrant of left breast in female, estrogen receptor positive  - #01/14/2019.  Stage IA left breast cancer s/p lumpectomy and axillary lymph node biopsy. 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  Two sentinel lymph nodes negative for malignancy.  Tumor was ER + >90%, PR + >90%, and HER-2/neu negative (1+).  Pathologic stage was pT1b pN0.    02/16/2019 - 03/21/2019 Radiation Therapy   Adjuvant radiation   03/31/2019 -  Anti-estrogen oral therapy   Started on letrozole   11/29/2020 Mammogram   Bilateral diagnostic mammogram showed No mammographic evidence for malignancy. Stable lumpectomy changes left breast.    # iron deficiency anemia.   Gastroenterology work-up EGD on 07/23/2020 revealed erosive gastropathy with no stigmata of recent bleeding. Colonoscopy on 07/23/2020 revealed diverticulosis in the sigmoid colon, descending colon and ascending colon. There were non-bleeding internal hemorrhoids.   INTERVAL HISTORY WAnairaCAmela Handleyis a 71y.o. female who has above history reviewed  by me today presents for follow up visit for history of stage Ia left breast cancer, and iron deficiency anemia Ports feeling well.  No new complaints.  She tolerates letrozole.   Past Medical History:  Diagnosis Date   Arthritis    Breast cancer ([place])    Chronic kidney disease    Chronic Renal Insufficiency - Stage 2   DDD (degenerative disc disease),  cervical    Diabetes mellitus without complication (HCC)    GERD (gastroesophageal reflux disease)    occ no meds   Goiter    Headache    simus   Hypercholesteremia    Hypertension    Hypothyroidism    Malignant neoplasm of upper-inner quadrant of left breast in female, estrogen receptor positive ([place]) 02/04/2019   Personal history of radiation therapy    Proteinuria    Wears dentures    partial upper, full lower    Past Surgical History:  Procedure Laterality Date   ABDOMINAL HYSTERECTOMY     BREAST BIOPSY Left 12/14/2018   [facility]   BREAST LUMPECTOMY Left 01/14/2019   [facility]   COLONOSCOPY WITH PROPOFOL N/A 02/09/2015   Procedure: COLONOSCOPY WITH PROPOFOL;  Surgeon: Hulen Luster, MD;  Location: [facility] ENDOSCOPY;  Service: Gastroenterology;  Laterality: N/A;   COLONOSCOPY WITH PROPOFOL N/A 07/23/2020   Procedure: COLONOSCOPY WITH PROPOFOL;  Surgeon: Lesly Rubenstein, MD;  Location: [facility] ENDOSCOPY;  Service: Endoscopy;  Laterality: N/A;   ESOPHAGOGASTRODUODENOSCOPY (EGD) WITH PROPOFOL N/A 07/23/2020   Procedure: ESOPHAGOGASTRODUODENOSCOPY (EGD) WITH PROPOFOL;  Surgeon: Lesly Rubenstein, MD;  Location: [facility] ENDOSCOPY;  Service: Endoscopy;  Laterality: N/A;   PARTIAL MASTECTOMY WITH NEEDLE LOCALIZATION AND AXILLARY SENTINEL LYMPH NODE BX Left 01/14/2019   Procedure: PARTIAL MASTECTOMY WITH NEEDLE LOCALIZATION AND AXILLARY SENTINEL LYMPH NODE BX;  Surgeon: Benjamine Sprague, DO;  Location: [facility] ORS;  Service: General;  Laterality: Left;   THYROIDECTOMY      Family History  Problem Relation Age of Onset   Diabetes Mother    Diabetes Maternal Aunt    Diabetes Maternal Uncle    Breast cancer Neg Hx     Social History:  reports that she has never smoked. She has never used smokeless tobacco. She reports that she does not drink alcohol and does not use drugs.  Allergies:  Allergies  Allergen Reactions   Altace [Ramipril] Other (See Comments)    Angioedema   Avandia [Rosiglitazone] Other (See  Comments)     Muscle Aches   Crestor [Rosuvastatin] Other (See Comments)    Muscle ache   Doxycycline Other (See Comments)    Muscle Aches   Lipitor [Atorvastatin] Other (See Comments)    Muscle ache    Current Medications: Current Outpatient Medications  Medication Sig Dispense Refill   aspirin EC 81 MG tablet Take 81 mg by mouth daily.      fluticasone (FLONASE) 50 MCG/ACT nasal spray Place 2 sprays into both nostrils daily.     glimepiride (AMARYL) 4 MG tablet Take 4 mg by mouth in the morning and at bedtime. Combines this with the 2 mg glimepiride     levothyroxine (SYNTHROID) 88 MCG tablet Take by mouth.     losartan (COZAAR) 100 MG tablet Take 100 mg by mouth every morning.      metFORMIN (GLUCOPHAGE-XR) 500 MG 24 hr tablet Take 1,000 mg by mouth 2 (two) times daily.      metoprolol succinate (TOPROL-XL) 25 MG 24 hr tablet Take 25 mg by mouth daily. Unsure  of dose/ just started 8/20     Multiple Vitamin (MULTI-VITAMIN) tablet Take 1 tablet by mouth daily.      ONETOUCH VERIO test strip 1 each 2 (two) times daily.     pioglitazone (ACTOS) 15 MG tablet Take by mouth.     simvastatin (ZOCOR) 40 MG tablet Take 40 mg by mouth daily at 6 PM.      letrozole (FEMARA) 2.5 MG tablet Take 1 tablet (2.5 mg total) by mouth daily. 90 tablet 1   No current facility-administered medications for this visit.   Review of Systems  Constitutional:  Negative for appetite change, chills, fatigue and fever.  HENT:   Negative for hearing loss and voice change.   Eyes:  Negative for eye problems.  Respiratory:  Negative for chest tightness and cough.   Cardiovascular:  Negative for chest pain.  Gastrointestinal:  Negative for abdominal distention, abdominal pain and blood in stool.  Endocrine: Negative for hot flashes.  Genitourinary:  Negative for difficulty urinating and frequency.   Musculoskeletal:  Negative for arthralgias.  Skin:  Negative for itching and rash.  Neurological:  Negative for  extremity weakness.  Hematological:  Negative for adenopathy.  Psychiatric/Behavioral:  Negative for confusion.     Performance status (ECOG): 0  Vitals Blood pressure (!) 141/90, pulse 75, temperature 98.5 F (36.9 C), resp. rate 18, weight 205 lb 9.6 oz (93.3 kg).  Physical Exam Constitutional:      General: She is not in acute distress.    Appearance: She is not diaphoretic.  HENT:     Head: Normocephalic and atraumatic.     Nose: Nose normal.     Mouth/Throat:     Pharynx: No oropharyngeal exudate.  Eyes:     General: No scleral icterus.    Pupils: Pupils are equal, round, and reactive to light.  Cardiovascular:     Rate and Rhythm: Normal rate and regular rhythm.     Heart sounds: No murmur heard. Pulmonary:     Effort: Pulmonary effort is normal. No respiratory distress.     Breath sounds: No rales.  Chest:     Chest wall: No tenderness.  Abdominal:     General: There is no distension.     Palpations: Abdomen is soft.     Tenderness: There is no abdominal tenderness.  Musculoskeletal:        General: Normal range of motion.     Cervical back: Normal range of motion and neck supple.  Skin:    General: Skin is warm and dry.     Findings: No erythema.  Neurological:     Mental Status: She is alert and oriented to person, place, and time.     Cranial Nerves: No cranial nerve deficit.     Motor: No abnormal muscle tone.     Coordination: Coordination normal.  Psychiatric:        Mood and Affect: Affect normal.    Breast exam was performed in seated and lying down position. Patient is status post left lumpectomy with a well-healed surgical scar.  No palpable breast masses bilaterally.  No palpable axillary adenopathy bilaterally.    Labs are reviewed and discussed with patient.    Latest Ref Rng & Units 05/16/2022    9:32 AM 11/05/2021   10:33 AM 05/07/2021    9:54 AM  CBC  WBC 4.0 - 10.5 K/uL 5.7  4.2  5.3   Hemoglobin 12.0 - 15.0 g/dL 11.0  11.2  11.2  Hematocrit 36.0 - 46.0 % 34.4  36.0  34.9   Platelets 150 - 400 K/uL 221  192  204        Latest Ref Rng & Units 05/16/2022    9:32 AM 11/05/2021   10:33 AM 05/07/2021    9:54 AM  CMP  Glucose 70 - 99 mg/dL 176  87  159   BUN 8 - 23 mg/dL _0 Creatinine 0.44 - 1.00 mg/dL 1.15  1.36  0.94   Sodium 135 - 145 mmol/L 139  141  139   Potassium 3.5 - 5.1 mmol/L 4.6  4.6  4.4   Chloride 98 - 111 mmol/L 106  108  102   CO2 22 - 32 mmol/L _1 Calcium 8.9 - 10.3 mg/dL 9.1  9.5  9.5   Total Protein 6.5 - 8.1 g/dL 7.7  7.6  7.8   Total Bilirubin 0.3 - 1.2 mg/dL 0.3  0.4  0.7   Alkaline Phos 38 - 126 U/L 89  63  88   AST 15 - 41 U/L _2 ALT 0 - 44 U/L _3 Iron/TIBC/Ferritin/ %Sat    Component Value Date/Time   IRON 47 05/16/2022 0932   TIBC 295 05/16/2022 0932   FERRITIN 34 05/16/2022 0932   IRONPCTSAT 16 05/16/2022 0932

## 2022-05-18 NOTE — Assessment & Plan Note (Addendum)
Labs reviewed and discussed with patient, stable hemoglobin and iron panel. Continue oral iron supplementation once daily.

## 2022-05-18 NOTE — Assessment & Plan Note (Addendum)
Encourage oral hydration and avoid nephrotoxins.   

## 2022-07-16 DIAGNOSIS — Z853 Personal history of malignant neoplasm of breast: Secondary | ICD-10-CM | POA: Diagnosis not present

## 2022-07-16 DIAGNOSIS — E669 Obesity, unspecified: Secondary | ICD-10-CM | POA: Diagnosis not present

## 2022-07-16 DIAGNOSIS — E1122 Type 2 diabetes mellitus with diabetic chronic kidney disease: Secondary | ICD-10-CM | POA: Diagnosis not present

## 2022-07-16 DIAGNOSIS — N1831 Chronic kidney disease, stage 3a: Secondary | ICD-10-CM | POA: Diagnosis not present

## 2022-07-16 DIAGNOSIS — E890 Postprocedural hypothyroidism: Secondary | ICD-10-CM | POA: Diagnosis not present

## 2022-07-16 DIAGNOSIS — Z0000 Encounter for general adult medical examination without abnormal findings: Secondary | ICD-10-CM | POA: Diagnosis not present

## 2022-07-16 DIAGNOSIS — E559 Vitamin D deficiency, unspecified: Secondary | ICD-10-CM | POA: Diagnosis not present

## 2022-07-16 DIAGNOSIS — R12 Heartburn: Secondary | ICD-10-CM | POA: Diagnosis not present

## 2022-07-16 DIAGNOSIS — I6523 Occlusion and stenosis of bilateral carotid arteries: Secondary | ICD-10-CM | POA: Diagnosis not present

## 2022-07-16 DIAGNOSIS — M5030 Other cervical disc degeneration, unspecified cervical region: Secondary | ICD-10-CM | POA: Diagnosis not present

## 2022-07-16 DIAGNOSIS — E1169 Type 2 diabetes mellitus with other specified complication: Secondary | ICD-10-CM | POA: Diagnosis not present

## 2022-07-16 DIAGNOSIS — I493 Ventricular premature depolarization: Secondary | ICD-10-CM | POA: Diagnosis not present

## 2022-07-16 DIAGNOSIS — E1129 Type 2 diabetes mellitus with other diabetic kidney complication: Secondary | ICD-10-CM | POA: Diagnosis not present

## 2022-07-16 DIAGNOSIS — J301 Allergic rhinitis due to pollen: Secondary | ICD-10-CM | POA: Diagnosis not present

## 2022-12-10 ENCOUNTER — Ambulatory Visit
Admission: RE | Admit: 2022-12-10 | Discharge: 2022-12-10 | Disposition: A | Discharge status: Home / Self Care | Payer: Medicare HMO | Source: Ambulatory Visit | Attending: Oncology | Admitting: Oncology

## 2022-12-10 DIAGNOSIS — Z1231 Encounter for screening mammogram for malignant neoplasm of breast: Secondary | ICD-10-CM | POA: Insufficient documentation

## 2022-12-10 DIAGNOSIS — C50212 Malignant neoplasm of upper-inner quadrant of left female breast: Secondary | ICD-10-CM | POA: Diagnosis not present

## 2022-12-10 DIAGNOSIS — Z170 Estrogen receptor positive status [ER+]: Secondary | ICD-10-CM | POA: Diagnosis not present

## 2022-12-15 ENCOUNTER — Inpatient Hospital Stay: Payer: Medicare HMO | Admitting: Oncology

## 2022-12-15 ENCOUNTER — Inpatient Hospital Stay: Payer: Medicare HMO

## 2022-12-24 ENCOUNTER — Other Ambulatory Visit: Payer: Self-pay

## 2022-12-24 ENCOUNTER — Encounter: Payer: Self-pay | Admitting: Oncology

## 2022-12-24 ENCOUNTER — Inpatient Hospital Stay (HOSPITAL_BASED_OUTPATIENT_CLINIC_OR_DEPARTMENT_OTHER): Payer: Medicare HMO | Admitting: Oncology

## 2022-12-24 ENCOUNTER — Inpatient Hospital Stay: Payer: Medicare HMO | Attending: Oncology

## 2022-12-24 VITALS — BP 134/78 | HR 58 | Temp 97.4°F | Resp 18 | Wt 202.7 lb

## 2022-12-24 DIAGNOSIS — D631 Anemia in chronic kidney disease: Secondary | ICD-10-CM | POA: Diagnosis not present

## 2022-12-24 DIAGNOSIS — Z79811 Long term (current) use of aromatase inhibitors: Secondary | ICD-10-CM | POA: Diagnosis not present

## 2022-12-24 DIAGNOSIS — N1831 Chronic kidney disease, stage 3a: Secondary | ICD-10-CM

## 2022-12-24 DIAGNOSIS — C50212 Malignant neoplasm of upper-inner quadrant of left female breast: Secondary | ICD-10-CM | POA: Insufficient documentation

## 2022-12-24 DIAGNOSIS — Z90710 Acquired absence of both cervix and uterus: Secondary | ICD-10-CM | POA: Diagnosis not present

## 2022-12-24 DIAGNOSIS — Z170 Estrogen receptor positive status [ER+]: Secondary | ICD-10-CM

## 2022-12-24 DIAGNOSIS — D509 Iron deficiency anemia, unspecified: Secondary | ICD-10-CM | POA: Insufficient documentation

## 2022-12-24 DIAGNOSIS — N182 Chronic kidney disease, stage 2 (mild): Secondary | ICD-10-CM | POA: Diagnosis not present

## 2022-12-24 LAB — COMPREHENSIVE METABOLIC PANEL
ALT: 16 U/L (ref 0–44)
AST: 18 U/L (ref 15–41)
Albumin: 3.5 g/dL (ref 3.5–5.0)
Alkaline Phosphatase: 62 U/L (ref 38–126)
Anion gap: 6 (ref 5–15)
BUN: 33 mg/dL — ABNORMAL HIGH (ref 8–23)
CO2: 25 mmol/L (ref 22–32)
Calcium: 9.2 mg/dL (ref 8.9–10.3)
Chloride: 107 mmol/L (ref 98–111)
Creatinine, Ser: 1.54 mg/dL — ABNORMAL HIGH (ref 0.44–1.00)
GFR, Estimated: 35 mL/min — ABNORMAL LOW (ref >60)
Glucose, Bld: 65 mg/dL — ABNORMAL LOW (ref 70–99)
Potassium: 4.8 mmol/L (ref 3.5–5.1)
Sodium: 138 mmol/L (ref 135–145)
Total Bilirubin: 0.3 mg/dL (ref 0.3–1.2)
Total Protein: 7.6 g/dL (ref 6.5–8.1)

## 2022-12-24 LAB — RETIC PANEL
Immature Retic Fract: 8.3 % (ref 2.3–15.9)
RBC.: 3.67 MIL/uL — ABNORMAL LOW (ref 3.87–5.11)
Retic Count, Absolute: 35.6 10*3/uL (ref 19.0–186.0)
Retic Ct Pct: 1 % (ref 0.4–3.1)
Reticulocyte Hemoglobin: 31.9 pg (ref >27.9)

## 2022-12-24 LAB — IRON AND TIBC
Iron: 50 ug/dL (ref 28–170)
Saturation Ratios: 15 % (ref 10.4–31.8)
TIBC: 339 ug/dL (ref 250–450)
UIBC: 289 ug/dL

## 2022-12-24 LAB — CBC WITH DIFFERENTIAL/PLATELET
Abs Immature Granulocytes: 0 10*3/uL (ref 0.00–0.07)
Basophils Absolute: 0 10*3/uL (ref 0.0–0.1)
Basophils Relative: 0 %
Eosinophils Absolute: 0.1 10*3/uL (ref 0.0–0.5)
Eosinophils Relative: 2 %
HCT: 32.6 % — ABNORMAL LOW (ref 36.0–46.0)
Hemoglobin: 10.3 g/dL — ABNORMAL LOW (ref 12.0–15.0)
Immature Granulocytes: 0 %
Lymphocytes Relative: 36 %
Lymphs Abs: 1.6 10*3/uL (ref 0.7–4.0)
MCH: 28.1 pg (ref 26.0–34.0)
MCHC: 31.6 g/dL (ref 30.0–36.0)
MCV: 89.1 fL (ref 80.0–100.0)
Monocytes Absolute: 0.5 10*3/uL (ref 0.1–1.0)
Monocytes Relative: 10 %
Neutro Abs: 2.3 10*3/uL (ref 1.7–7.7)
Neutrophils Relative %: 52 %
Platelets: 173 10*3/uL (ref 150–400)
RBC: 3.66 MIL/uL — ABNORMAL LOW (ref 3.87–5.11)
RDW: 15 % (ref 11.5–15.5)
WBC: 4.5 10*3/uL (ref 4.0–10.5)
nRBC: 0 % (ref 0.0–0.2)

## 2022-12-24 LAB — FOLATE: Folate: 20.1 ng/mL (ref >5.9)

## 2022-12-24 LAB — FERRITIN: Ferritin: 37 ng/mL (ref 11–307)

## 2022-12-24 MED ORDER — LETROZOLE 2.5 MG PO TABS
2.5000 mg | ORAL_TABLET | Freq: Every day | ORAL | 1 refills | Status: DC
Start: 2022-12-24 — End: 2023-04-01

## 2022-12-24 NOTE — Assessment & Plan Note (Signed)
Continue calcium and vitamin D supplementation. She has DEXA done in Dec 2022, normal result.  Reccommend to repeat every 2 years. She prefers to ask her PCP to order in Dec 2024

## 2022-12-24 NOTE — Progress Notes (Signed)
Clinic Day:  12/24/2022  ASSESSMENT & PLAN:   Cancer Staging  Malignant neoplasm of upper-inner quadrant of left breast in female, estrogen receptor positive (HCC) Staging form: Breast, AJCC 8th Edition - Pathologic stage from 01/14/2019: Stage IA (pT1b, pN0, cM0, G1, ER+, PR+, HER2-) - Signed by Rickard Patience, MD on 05/18/2022   Malignant neoplasm of upper-inner quadrant of left breast in female, estrogen receptor positive (HCC) History of stage IA left breast cancer, ER positive, PR positive, HER2 negative. pTb pN0 On Letrozole since 03/31/2019, plan 5 years, till 03/2024 Labs reviewed and discussed with patient Continue letrozole 2.5mg  daily Recent mammgram results were reviewed with patient.  Obtain annual mammogram, due in July 2025.  Anemia in chronic kidney disease (CKD) Labs reviewed and discussed with patient,  Lab Results  Component Value Date   HGB 10.3 (L) 12/24/2022   TIBC 339 12/24/2022   IRONPCTSAT 15 12/24/2022   FERRITIN 37 12/24/2022    Hemoglobin has dropped, ferritin is <200 Discussed with patient about rationale and side effects of IV venofer. She is hesitant  I recommend patient to increase oral ferrous sulfate 325mg  BID with vitamin C.  Follow up in 3 months.    Aromatase inhibitor use Continue calcium and vitamin D supplementation. She has DEXA done in Dec 2022, normal result.  Reccommend to repeat every 2 years. She prefers to ask her PCP to order in Dec 2024  Stage 3a chronic kidney disease (CKD) (HCC) Encourage oral hydration and avoid nephrotoxins.    Orders Placed This Encounter  Procedures   CBC with Differential (Cancer Center Only)    Standing Status:   Future    Standing Expiration Date:   12/24/2023   Iron and TIBC    Standing Status:   Future    Standing Expiration Date:   12/24/2023   Ferritin    Standing Status:   Future    Standing Expiration Date:   12/24/2023   Retic Panel    Standing Status:   Future    Standing Expiration  Date:   12/24/2023   Vitamin B12    Standing Status:   Future    Standing Expiration Date:   12/24/2023   Multiple Myeloma Panel (SPEP&IFE w/QIG)    Standing Status:   Future    Standing Expiration Date:   12/24/2023   Kappa/lambda light chains    Standing Status:   Future    Standing Expiration Date:   12/24/2023   Iron and TIBC    Standing Status:   Future    Number of Occurrences:   1    Standing Expiration Date:   12/24/2023   Ferritin    Standing Status:   Future    Number of Occurrences:   1    Standing Expiration Date:   12/24/2023   Retic Panel    Standing Status:   Future    Number of Occurrences:   1    Standing Expiration Date:   12/24/2023   Folate    Standing Status:   Future    Number of Occurrences:   1    Standing Expiration Date:   12/24/2023   Follow up  in 3 months All questions were answered. The patient knows to call the clinic with any problems, questions or concerns.  Rickard Patience, MD, PhD [facility] Hematology Oncology 12/24/2022    Chief Complaint: Caitlin Ford is a 72 y.o. female presents for follow up of stage I left  breast cancer and iron deficiency  PERTINENT HEMATOLOGY HISTORY Patient previously followed up by Dr.Corcoran, patient switched care to me on 01/03/21 Extensive medical record review was performed by me  Oncology History  Malignant neoplasm of upper-inner quadrant of left breast in female, estrogen receptor positive (HCC)  10/21/2018 Imaging   DEXA normal with a T-score of +0.3 in the left femoral neck and + 1.3 in AP spine L1-L4.   01/14/2019 Initial Diagnosis   Malignant neoplasm of upper-inner quadrant of left breast in female, estrogen receptor positive  - #01/14/2019.  Stage IA left breast cancer s/p lumpectomy and axillary lymph node biopsy. 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  Two sentinel lymph nodes negative for malignancy.  Tumor was ER + >90%, PR + >90%, and  HER-2/neu negative (1+).  Pathologic stage was pT1b pN0.    01/14/2019 Cancer Staging   Staging form: Breast, AJCC 8th Edition - Pathologic stage from 01/14/2019: Stage IA (pT1b, pN0, cM0, G1, ER+, PR+, HER2-) - Signed by Rickard Patience, MD on 05/18/2022 Stage prefix: Initial diagnosis Histologic grading system: 3 grade system   02/16/2019 - 03/21/2019 Radiation Therapy   Adjuvant radiation   03/31/2019 -  Anti-estrogen oral therapy   Started on letrozole   11/29/2020 Mammogram   Bilateral diagnostic mammogram showed No mammographic evidence for malignancy. Stable lumpectomy changes left breast.    # iron deficiency anemia.   Gastroenterology work-up EGD on 07/23/2020 revealed erosive gastropathy with no stigmata of recent bleeding. Colonoscopy on 07/23/2020 revealed diverticulosis in the sigmoid colon, descending colon and ascending colon. There were non-bleeding internal hemorrhoids.   INTERVAL HISTORY Caitlin Ford is a 71 y.o. female who has above history reviewed by me today presents for follow up visit for history of stage Ia left breast cancer, and iron deficiency anemia Ports feeling well.  No new complaints.  She tolerates letrozole.   Past Medical History:  Diagnosis Date   Arthritis    Breast cancer (HCC)    Chronic kidney disease    Chronic Renal Insufficiency - Stage 2   DDD (degenerative disc disease), cervical    Diabetes mellitus without complication (HCC)    GERD (gastroesophageal reflux disease)    occ no meds   Goiter    Headache    simus   Hypercholesteremia    Hypertension    Hypothyroidism    Malignant neoplasm of upper-inner quadrant of left breast in female, estrogen receptor positive (HCC) 02/04/2019   Personal history of radiation therapy    Proteinuria    Wears dentures    partial upper, full lower    Past Surgical History:  Procedure Laterality Date   ABDOMINAL HYSTERECTOMY     BREAST BIOPSY Left 12/14/2018   [facility]   BREAST LUMPECTOMY Left  01/14/2019   [facility]   COLONOSCOPY WITH PROPOFOL N/A 02/09/2015   Procedure: COLONOSCOPY WITH PROPOFOL;  Surgeon: Wallace Cullens, MD;  Location: [facility] ENDOSCOPY;  Service: Gastroenterology;  Laterality: N/A;   COLONOSCOPY WITH PROPOFOL N/A 07/23/2020   Procedure: COLONOSCOPY WITH PROPOFOL;  Surgeon: Regis Bill, MD;  Location: [facility] ENDOSCOPY;  Service: Endoscopy;  Laterality: N/A;   ESOPHAGOGASTRODUODENOSCOPY (EGD) WITH PROPOFOL N/A 07/23/2020   Procedure: ESOPHAGOGASTRODUODENOSCOPY (EGD) WITH PROPOFOL;  Surgeon: Regis Bill, MD;  Location: [facility] ENDOSCOPY;  Service: Endoscopy;  Laterality: N/A;   PARTIAL MASTECTOMY WITH NEEDLE LOCALIZATION AND AXILLARY SENTINEL LYMPH NODE BX Left 01/14/2019   Procedure: PARTIAL MASTECTOMY WITH  NEEDLE LOCALIZATION AND AXILLARY SENTINEL LYMPH NODE BX;  Surgeon: Sung Amabile, DO;  Location: [facility] ORS;  Service: General;  Laterality: Left;   THYROIDECTOMY      Family History  Problem Relation Age of Onset   Diabetes Mother    Diabetes Maternal Aunt    Diabetes Maternal Uncle    Breast cancer Neg Hx     Social History:  reports that she has never smoked. She has never used smokeless tobacco. She reports that she does not drink alcohol and does not use drugs.  Allergies:  Allergies  Allergen Reactions   Altace [Ramipril] Other (See Comments)    Angioedema   Avandia [Rosiglitazone] Other (See Comments)     Muscle Aches   Crestor [Rosuvastatin] Other (See Comments)    Muscle ache   Doxycycline Other (See Comments)    Muscle Aches   Lipitor [Atorvastatin] Other (See Comments)    Muscle ache    Current Medications: Current Outpatient Medications  Medication Sig Dispense Refill   aspirin EC 81 MG tablet Take 81 mg by mouth daily.      fluticasone (FLONASE) 50 MCG/ACT nasal spray Place 2 sprays into both nostrils daily.     glimepiride (AMARYL) 4 MG tablet Take 4 mg by mouth in the morning and at bedtime. Combines this with the 2 mg glimepiride      levothyroxine (SYNTHROID) 88 MCG tablet Take by mouth.     losartan (COZAAR) 100 MG tablet Take 100 mg by mouth every morning.      metFORMIN (GLUCOPHAGE-XR) 500 MG 24 hr tablet Take 1,000 mg by mouth 2 (two) times daily.      metoprolol succinate (TOPROL-XL) 25 MG 24 hr tablet Take 25 mg by mouth daily. Unsure of dose/ just started 8/20     Multiple Vitamin (MULTI-VITAMIN) tablet Take 1 tablet by mouth daily.      ONETOUCH VERIO test strip 1 each 2 (two) times daily.     pioglitazone (ACTOS) 15 MG tablet Take by mouth.     simvastatin (ZOCOR) 40 MG tablet Take 40 mg by mouth daily at 6 PM.      letrozole (FEMARA) 2.5 MG tablet Take 1 tablet (2.5 mg total) by mouth daily. 90 tablet 1   No current facility-administered medications for this visit.   Review of Systems  Constitutional:  Negative for appetite change, chills, fatigue and fever.  HENT:   Negative for hearing loss and voice change.   Eyes:  Negative for eye problems.  Respiratory:  Negative for chest tightness and cough.   Cardiovascular:  Negative for chest pain.  Gastrointestinal:  Negative for abdominal distention, abdominal pain and blood in stool.  Endocrine: Negative for hot flashes.  Genitourinary:  Negative for difficulty urinating and frequency.   Musculoskeletal:  Negative for arthralgias.  Skin:  Negative for itching and rash.  Neurological:  Negative for extremity weakness.  Hematological:  Negative for adenopathy.  Psychiatric/Behavioral:  Negative for confusion.     Performance status (ECOG): 0  Vitals Blood pressure 134/78, pulse (!) 58, temperature (!) 97.4 F (36.3 C), resp. rate 18, weight 202 lb 11.2 oz (91.9 kg).  Physical Exam Constitutional:      General: She is not in acute distress.    Appearance: She is not diaphoretic.  HENT:     Head: Normocephalic and atraumatic.     Nose: Nose normal.     Mouth/Throat:     Pharynx: No oropharyngeal exudate.  Eyes:  General: No scleral icterus.     Pupils: Pupils are equal, round, and reactive to light.  Cardiovascular:     Rate and Rhythm: Normal rate and regular rhythm.     Heart sounds: No murmur heard. Pulmonary:     Effort: Pulmonary effort is normal. No respiratory distress.     Breath sounds: No rales.  Chest:     Chest wall: No tenderness.  Abdominal:     General: There is no distension.     Palpations: Abdomen is soft.     Tenderness: There is no abdominal tenderness.  Musculoskeletal:        General: Normal range of motion.     Cervical back: Normal range of motion and neck supple.  Skin:    General: Skin is warm and dry.     Findings: No erythema.  Neurological:     Mental Status: She is alert and oriented to person, place, and time.     Cranial Nerves: No cranial nerve deficit.     Motor: No abnormal muscle tone.     Coordination: Coordination normal.  Psychiatric:        Mood and Affect: Affect normal.    Breast exam was performed in seated and lying down position. Patient is status post left lumpectomy with a well-healed surgical scar.  No palpable breast masses bilaterally.  No palpable axillary adenopathy bilaterally.    Labs are reviewed and discussed with patient.    Latest Ref Rng & Units 12/24/2022   10:05 AM 05/16/2022    9:32 AM 11/05/2021   10:33 AM  CBC  WBC 4.0 - 10.5 K/uL 4.5  5.7  4.2   Hemoglobin 12.0 - 15.0 g/dL 40.9  81.1  91.4   Hematocrit 36.0 - 46.0 % 32.6  34.4  36.0   Platelets 150 - 400 K/uL 173  221  192        Latest Ref Rng & Units 12/24/2022   10:05 AM 05/16/2022    9:32 AM 11/05/2021   10:33 AM  CMP  Glucose 70 - 99 mg/dL 65  782  87   BUN 8 - 23 mg/dL 33  20  26   Creatinine 0.44 - 1.00 mg/dL 9.56  2.13  0.86   Sodium 135 - 145 mmol/L 138  139  141   Potassium 3.5 - 5.1 mmol/L 4.8  4.6  4.6   Chloride 98 - 111 mmol/L 107  106  108   CO2 22 - 32 mmol/L 25  24  25    Calcium 8.9 - 10.3 mg/dL 9.2  9.1  9.5   Total Protein 6.5 - 8.1 g/dL 7.6  7.7  7.6   Total Bilirubin  0.3 - 1.2 mg/dL 0.3  0.3  0.4   Alkaline Phos 38 - 126 U/L 62  89  63   AST 15 - 41 U/L 18  20  18    ALT 0 - 44 U/L 16  17  12      Iron/TIBC/Ferritin/ %Sat    Component Value Date/Time   IRON 50 12/24/2022 1005   TIBC 339 12/24/2022 1005   FERRITIN 37 12/24/2022 1005   IRONPCTSAT 15 12/24/2022 1005

## 2022-12-24 NOTE — Assessment & Plan Note (Signed)
Encourage oral hydration and avoid nephrotoxins.   

## 2022-12-24 NOTE — Assessment & Plan Note (Addendum)
History of stage IA left breast cancer, ER positive, PR positive, HER2 negative. pTb pN0 On Letrozole since 03/31/2019, plan 5 years, till 03/2024 Labs reviewed and discussed with patient Continue letrozole 2.5mg  daily Recent mammgram results were reviewed with patient.  Obtain annual mammogram, due in July 2025.

## 2022-12-24 NOTE — Progress Notes (Signed)
Pt here for follow up. No new breast problems.

## 2022-12-24 NOTE — Assessment & Plan Note (Addendum)
Labs reviewed and discussed with patient,  Lab Results  Component Value Date   HGB 10.3 (L) 12/24/2022   TIBC 339 12/24/2022   IRONPCTSAT 15 12/24/2022   FERRITIN 37 12/24/2022    Hemoglobin has dropped, ferritin is <200 Discussed with patient about rationale and side effects of IV venofer. She is hesitant  I recommend patient to increase oral ferrous sulfate 325mg  BID with vitamin C.  Follow up in 3 months.

## 2023-01-17 DIAGNOSIS — E785 Hyperlipidemia, unspecified: Secondary | ICD-10-CM | POA: Diagnosis not present

## 2023-01-17 DIAGNOSIS — I129 Hypertensive chronic kidney disease with stage 1 through stage 4 chronic kidney disease, or unspecified chronic kidney disease: Secondary | ICD-10-CM | POA: Diagnosis not present

## 2023-01-17 DIAGNOSIS — M1990 Unspecified osteoarthritis, unspecified site: Secondary | ICD-10-CM | POA: Diagnosis not present

## 2023-01-17 DIAGNOSIS — E890 Postprocedural hypothyroidism: Secondary | ICD-10-CM | POA: Diagnosis not present

## 2023-01-17 DIAGNOSIS — Z7984 Long term (current) use of oral hypoglycemic drugs: Secondary | ICD-10-CM | POA: Diagnosis not present

## 2023-01-17 DIAGNOSIS — N189 Chronic kidney disease, unspecified: Secondary | ICD-10-CM | POA: Diagnosis not present

## 2023-01-17 DIAGNOSIS — E669 Obesity, unspecified: Secondary | ICD-10-CM | POA: Diagnosis not present

## 2023-01-17 DIAGNOSIS — C50919 Malignant neoplasm of unspecified site of unspecified female breast: Secondary | ICD-10-CM | POA: Diagnosis not present

## 2023-01-17 DIAGNOSIS — E1140 Type 2 diabetes mellitus with diabetic neuropathy, unspecified: Secondary | ICD-10-CM | POA: Diagnosis not present

## 2023-01-17 DIAGNOSIS — E1122 Type 2 diabetes mellitus with diabetic chronic kidney disease: Secondary | ICD-10-CM | POA: Diagnosis not present

## 2023-01-17 DIAGNOSIS — K219 Gastro-esophageal reflux disease without esophagitis: Secondary | ICD-10-CM | POA: Diagnosis not present

## 2023-01-21 DIAGNOSIS — E782 Mixed hyperlipidemia: Secondary | ICD-10-CM | POA: Diagnosis not present

## 2023-01-21 DIAGNOSIS — Z23 Encounter for immunization: Secondary | ICD-10-CM | POA: Diagnosis not present

## 2023-01-21 DIAGNOSIS — E1129 Type 2 diabetes mellitus with other diabetic kidney complication: Secondary | ICD-10-CM | POA: Diagnosis not present

## 2023-01-21 DIAGNOSIS — I152 Hypertension secondary to endocrine disorders: Secondary | ICD-10-CM | POA: Diagnosis not present

## 2023-01-21 DIAGNOSIS — R809 Proteinuria, unspecified: Secondary | ICD-10-CM | POA: Diagnosis not present

## 2023-01-21 DIAGNOSIS — N1831 Chronic kidney disease, stage 3a: Secondary | ICD-10-CM | POA: Diagnosis not present

## 2023-01-21 DIAGNOSIS — E1122 Type 2 diabetes mellitus with diabetic chronic kidney disease: Secondary | ICD-10-CM | POA: Diagnosis not present

## 2023-01-21 DIAGNOSIS — D631 Anemia in chronic kidney disease: Secondary | ICD-10-CM | POA: Diagnosis not present

## 2023-01-21 DIAGNOSIS — E1159 Type 2 diabetes mellitus with other circulatory complications: Secondary | ICD-10-CM | POA: Diagnosis not present

## 2023-01-21 DIAGNOSIS — Z09 Encounter for follow-up examination after completed treatment for conditions other than malignant neoplasm: Secondary | ICD-10-CM | POA: Diagnosis not present

## 2023-01-21 DIAGNOSIS — J301 Allergic rhinitis due to pollen: Secondary | ICD-10-CM | POA: Diagnosis not present

## 2023-01-21 DIAGNOSIS — E1169 Type 2 diabetes mellitus with other specified complication: Secondary | ICD-10-CM | POA: Diagnosis not present

## 2023-02-16 DIAGNOSIS — E1169 Type 2 diabetes mellitus with other specified complication: Secondary | ICD-10-CM | POA: Diagnosis not present

## 2023-02-16 DIAGNOSIS — E1129 Type 2 diabetes mellitus with other diabetic kidney complication: Secondary | ICD-10-CM | POA: Diagnosis not present

## 2023-02-16 DIAGNOSIS — R809 Proteinuria, unspecified: Secondary | ICD-10-CM | POA: Diagnosis not present

## 2023-02-16 DIAGNOSIS — E785 Hyperlipidemia, unspecified: Secondary | ICD-10-CM | POA: Diagnosis not present

## 2023-03-12 ENCOUNTER — Ambulatory Visit
Admission: EM | Admit: 2023-03-12 | Discharge: 2023-03-12 | Disposition: A | Discharge status: Home / Self Care | Payer: Medicare HMO | Attending: Physician Assistant | Admitting: Physician Assistant

## 2023-03-12 DIAGNOSIS — M79672 Pain in left foot: Secondary | ICD-10-CM | POA: Insufficient documentation

## 2023-03-12 DIAGNOSIS — M7989 Other specified soft tissue disorders: Secondary | ICD-10-CM | POA: Diagnosis not present

## 2023-03-12 LAB — URIC ACID: Uric Acid, Serum: 6.5 mg/dL (ref 2.5–7.1)

## 2023-03-12 MED ORDER — PREDNISONE 20 MG PO TABS: 40.0000 mg | ORAL_TABLET | Freq: Every day | ORAL | 0 refills | Status: AC

## 2023-03-12 NOTE — ED Provider Notes (Signed)
[facility]    CSN: [id number] Arrival date & time: 03/12/23  1208      History   Chief Complaint Chief Complaint  Patient presents with   Foot Pain    HPI Caitlin Ford is a 72 y.o. female with history of diabetes, CKD stage III, HTN, HLD, and breast cancer (2020).  Using presents for left foot pain, swelling and redness x 2 days.  Denies injury.  Has increased pain when she walks and bears weight.  Has been taking Tylenol and applying ice.  Symptoms are not any better today.  Reports increased swelling today.  History of arthritis.  No history of gout.  No history of DVT or PE.  HPI  Past Medical History:  Diagnosis Date   Arthritis    Breast cancer (HCC)    Chronic kidney disease    Chronic Renal Insufficiency - Stage 2   DDD (degenerative disc disease), cervical    Diabetes mellitus without complication (HCC)    GERD (gastroesophageal reflux disease)    occ no meds   Goiter    Headache    simus   Hypercholesteremia    Hypertension    Hypothyroidism    Malignant neoplasm of upper-inner quadrant of left breast in female, estrogen receptor positive (HCC) 02/04/2019   Personal history of radiation therapy    Proteinuria    Wears dentures    partial upper, full lower    Patient Active Problem List   Diagnosis Date Noted   Stage 3a chronic kidney disease (CKD) (HCC) 11/07/2021   Aromatase inhibitor use 05/09/2021   Anemia in chronic kidney disease (CKD) 04/24/2020   Malignant neoplasm of upper-inner quadrant of left breast in female, estrogen receptor positive (HCC) 02/04/2019    Past Surgical History:  Procedure Laterality Date   ABDOMINAL HYSTERECTOMY     BREAST BIOPSY Left 12/14/2018   [facility]   BREAST LUMPECTOMY Left 01/14/2019   [facility]   COLONOSCOPY WITH PROPOFOL N/A 02/09/2015   Procedure: COLONOSCOPY WITH PROPOFOL;  Surgeon: Wallace Cullens, MD;  Location: [facility] END[facility]COPY;  Service: Gastroenterology;  Laterality: N/A;   COLONOSCOPY WITH PROPOFOL N/A  07/23/2020   Procedure: COLONOSCOPY WITH PROPOFOL;  Surgeon: Regis Bill, MD;  Location: [facility];  Service: Endoscopy;  Laterality: N/A;   ESOPHAGOGASTRODUODENOSCOPY (EGD) WITH PROPOFOL N/A 07/23/2020   Procedure: ESOPHAGOGASTRODUODENOSCOPY (EGD) WITH PROPOFOL;  Surgeon: Regis Bill, MD;  Location: [facility];  Service: Endoscopy;  Laterality: N/A;   PARTIAL MASTECTOMY WITH NEEDLE LOCALIZATION AND AXILLARY SENTINEL LYMPH NODE BX Left 01/14/2019   Procedure: PARTIAL MASTECTOMY WITH NEEDLE LOCALIZATION AND AXILLARY SENTINEL LYMPH NODE BX;  Surgeon: Sung Amabile, DO;  Location: [facility] ORS;  Service: General;  Laterality: Left;   THYROIDECTOMY      OB History   No obstetric history on file.      Home Medications    Prior to Admission medications   Medication Sig Start Date End Date Taking? Authorizing Provider  aspirin EC 81 MG tablet Take 81 mg by mouth daily.    Yes [provider]  glimepiride (AMARYL) 4 MG tablet Take 4 mg by mouth in the morning and at bedtime. Combines this with the 2 mg glimepiride   Yes [provider]  letrozole (FEMARA) 2.5 MG tablet Take 1 tablet (2.5 mg total) by mouth daily. 12/24/22  Yes Rickard Patience, MD  losartan (COZAAR) 100 MG tablet Take 100 mg by mouth every morning.    Yes [provider]  metoprolol succinate (TOPROL-XL) 25 MG 24 hr tablet Take 25 mg by mouth daily. Unsure of dose/ just started 8/20   Yes [provider]  Multiple Vitamin (MULTI-VITAMIN) tablet Take 1 tablet by mouth daily.    Yes [provider]  [facility] VERIO test strip 1 each 2 (two) times daily. 03/30/20  Yes [provider]  predniSONE (DELTASONE) 20 MG tablet Take 2 tablets (40 mg total) by mouth daily for 5 days. 03/12/23 03/17/23 Yes Shirlee Latch, PA-C  simvastatin (ZOCOR) 40 MG tablet Take 40 mg by mouth daily at 6 PM.    Yes [provider]  levothyroxine (SYNTHROID) 88 MCG tablet Take by mouth.  11/05/20 12/24/22  [provider]  metFORMIN (GLUCOPHAGE-XR) 500 MG 24 hr tablet Take 1,000 mg by mouth 2 (two) times daily.  08/13/18 12/24/22  [provider]  pioglitazone (ACTOS) 15 MG tablet Take by mouth. 09/27/19 12/24/22  [provider]    Family History Family History  Problem Relation Age of Onset   Diabetes Mother    Diabetes Maternal Aunt    Diabetes Maternal Uncle    Breast cancer Neg Hx     Social History Social History   Tobacco Use   Smoking status: Never   Smokeless tobacco: Never  Vaping Use   Vaping status: Never Used  Substance Use Topics   Alcohol use: No   Drug use: No     Allergies   Altace [ramipril], Avandia [rosiglitazone], Crestor [rosuvastatin], Doxycycline, and Lipitor [atorvastatin]   Review of Systems Review of Systems  Constitutional:  Negative for fatigue and fever.  Musculoskeletal:  Positive for arthralgias and joint swelling. Negative for gait problem.  Skin:  Positive for color change. Negative for wound.  Neurological:  Negative for weakness and numbness.     Physical Exam Triage Vital Signs ED Triage Vitals  Encounter Vitals Group     BP 03/12/23 1215 (!) 174/77     Systolic BP Percentile --      Diastolic BP Percentile --      Pulse Rate 03/12/23 1215 80     Resp 03/12/23 1215 17     Temp 03/12/23 1215 98.4 F (36.9 C)     Temp Source 03/12/23 1215 Oral     SpO2 03/12/23 1215 96 %     Weight --      Height --      Head Circumference --      Peak Flow --      Pain Score 03/12/23 1216 7     Pain Loc --      Pain Education --      Exclude from Growth Chart --    No data found.  Updated Vital Signs BP (!) 144/80 (BP Location: Right Arm)   Pulse 80   Temp 98.4 F (36.9 C) (Oral)   Resp 17   SpO2 96%      Physical Exam Vitals and nursing note reviewed.  Constitutional:      General: She is not in acute distress.    Appearance: Normal appearance. She is not ill-appearing or  toxic-appearing.  HENT:     Head: Normocephalic and atraumatic.  Eyes:     General: No scleral icterus.       Right eye: No discharge.        Left eye: No discharge.     Conjunctiva/sclera: Conjunctivae normal.  Cardiovascular:     Rate and Rhythm: Normal rate and regular rhythm.  Pulses: Normal pulses.  Pulmonary:     Effort: Pulmonary effort is normal. No respiratory distress.  Musculoskeletal:     Cervical back: Neck supple.     Comments: Left Foot: There is mild erythema and increased warmth of the metatarsals 2 through 4 with tenderness to palpation.  Full range of motion of foot, good strength and normal pulses.  Skin:    General: Skin is dry.  Neurological:     General: No focal deficit present.     Mental Status: She is alert. Mental status is at baseline.     Motor: No weakness.     Gait: Gait normal.  Psychiatric:        Mood and Affect: Mood normal.      UC Treatments / Results  Labs (all labs ordered are listed, but only abnormal results are displayed) Labs Reviewed  URIC ACID    EKG   Radiology No results found.  Procedures Procedures (including critical care time)  Medications Ordered in UC Medications - No data to display  Initial Impression / Assessment and Plan / UC Course  I have reviewed the triage vital signs and the nursing notes.  Pertinent labs & imaging results that were available during my care of the patient were reviewed by me and considered in my medical decision making (see chart for details).   73 year old female with history of stage III CKD, breast cancer, hypertension, diabetes, hyperlipidemia and anemia presents for left foot pain, swelling and redness x 2 days.  Denies injury.  Tried ice, Tylenol and elevation without relief.  Patient is afebrile and overall well-appearing.  On exam she has area of erythema, swelling and tenderness of the metatarsals.  Will obtain uric acid to assess for possible gout.  Explained to  patient that this does not rule gou out if the uric acid level is normal.  Will treat for inflammatory arthritis causing condition.  Since patient has stage III CKD will avoid NSAIDs.  Treating at this time with prednisone.  Advised that this can increase her blood sugar.  Advised her to monitor this.  Reviewed ice, elevation, Voltaren gel, Tylenol.  Reviewed return and ER precautions.  Uric acid level within normal limits.  Called to discuss result with patient.  Final Clinical Impressions(s) / UC Diagnoses   Final diagnoses:  Right foot pain  Swelling of right foot     Discharge Instructions      -We are obtaining a uric acid level.  This level is high I think your symptoms may be due to gout.  If this level is normal it does not completely rule out possible gout.  If you do have gout you will make sure that you want to avoid taking diuretic medications, red meat, seafood, alcohol. - If the uric acid level is normal and still could be gout or it could be inflammation related to underlying osteoarthritis of the foot.  And other condition anti-inflammatory medication will be helpful.  You can apply topical Voltaren gel over-the-counter, take Tylenol, elevate and ice foot.  I sent prednisone to the pharmacy but you should make sure to monitor your blood sugar while you are taking this medication as it can cause elevations in blood sugar.     ED Prescriptions     Medication Sig Dispense Auth. Provider   predniSONE (DELTASONE) 20 MG tablet Take 2 tablets (40 mg total) by mouth daily for 5 days. 10 tablet Shirlee Latch, PA-C  PDMP not reviewed this encounter.   Shirlee Latch, PA-C 03/12/23 1444

## 2023-03-12 NOTE — ED Triage Notes (Signed)
Left foot pain x 2 days. Pain and swelling on the top of the foot.

## 2023-03-12 NOTE — Discharge Instructions (Signed)
-  We are obtaining a uric acid level.  This level is high I think your symptoms may be due to gout.  If this level is normal it does not completely rule out possible gout.  If you do have gout you will make sure that you want to avoid taking diuretic medications, red meat, seafood, alcohol. - If the uric acid level is normal and still could be gout or it could be inflammation related to underlying osteoarthritis of the foot.  And other condition anti-inflammatory medication will be helpful.  You can apply topical Voltaren gel over-the-counter, take Tylenol, elevate and ice foot.  I sent prednisone to the pharmacy but you should make sure to monitor your blood sugar while you are taking this medication as it can cause elevations in blood sugar.

## 2023-03-16 NOTE — Plan of Care (Signed)
[facility] Tonsillectomy/Adenoidectomy, Postoperative PEDS care plan entered in error.

## 2023-03-25 ENCOUNTER — Inpatient Hospital Stay: Payer: Medicare HMO | Attending: Oncology

## 2023-03-25 DIAGNOSIS — N1831 Chronic kidney disease, stage 3a: Secondary | ICD-10-CM | POA: Insufficient documentation

## 2023-03-25 DIAGNOSIS — D631 Anemia in chronic kidney disease: Secondary | ICD-10-CM | POA: Insufficient documentation

## 2023-03-25 DIAGNOSIS — Z170 Estrogen receptor positive status [ER+]: Secondary | ICD-10-CM | POA: Insufficient documentation

## 2023-03-25 DIAGNOSIS — Z79811 Long term (current) use of aromatase inhibitors: Secondary | ICD-10-CM | POA: Diagnosis not present

## 2023-03-25 DIAGNOSIS — C50212 Malignant neoplasm of upper-inner quadrant of left female breast: Secondary | ICD-10-CM | POA: Diagnosis not present

## 2023-03-25 LAB — CBC WITH DIFFERENTIAL (CANCER CENTER ONLY)
Abs Immature Granulocytes: 0.04 10*3/uL (ref 0.00–0.07)
Basophils Absolute: 0 10*3/uL (ref 0.0–0.1)
Basophils Relative: 1 %
Eosinophils Absolute: 0 10*3/uL (ref 0.0–0.5)
Eosinophils Relative: 0 %
HCT: 34.6 % — ABNORMAL LOW (ref 36.0–46.0)
Hemoglobin: 10.9 g/dL — ABNORMAL LOW (ref 12.0–15.0)
Immature Granulocytes: 1 %
Lymphocytes Relative: 26 %
Lymphs Abs: 2.2 10*3/uL (ref 0.7–4.0)
MCH: 28.4 pg (ref 26.0–34.0)
MCHC: 31.5 g/dL (ref 30.0–36.0)
MCV: 90.1 fL (ref 80.0–100.0)
Monocytes Absolute: 0.4 10*3/uL (ref 0.1–1.0)
Monocytes Relative: 5 %
Neutro Abs: 5.7 10*3/uL (ref 1.7–7.7)
Neutrophils Relative %: 67 %
Platelet Count: 187 10*3/uL (ref 150–400)
RBC: 3.84 MIL/uL — ABNORMAL LOW (ref 3.87–5.11)
RDW: 15.9 % — ABNORMAL HIGH (ref 11.5–15.5)
WBC Count: 8.4 10*3/uL (ref 4.0–10.5)
nRBC: 0 % (ref 0.0–0.2)

## 2023-03-25 LAB — IRON AND TIBC
Iron: 62 ug/dL (ref 28–170)
Saturation Ratios: 19 % (ref 10.4–31.8)
TIBC: 335 ug/dL (ref 250–450)
UIBC: 273 ug/dL

## 2023-03-25 LAB — RETIC PANEL
Immature Retic Fract: 18.8 % — ABNORMAL HIGH (ref 2.3–15.9)
RBC.: 3.87 MIL/uL (ref 3.87–5.11)
Retic Count, Absolute: 53.4 10*3/uL (ref 19.0–186.0)
Retic Ct Pct: 1.4 % (ref 0.4–3.1)
Reticulocyte Hemoglobin: 33.4 pg (ref >27.9)

## 2023-03-25 LAB — FERRITIN: Ferritin: 56 ng/mL (ref 11–307)

## 2023-03-25 LAB — VITAMIN B12: Vitamin B-12: 454 pg/mL (ref 180–914)

## 2023-03-26 DIAGNOSIS — Z23 Encounter for immunization: Secondary | ICD-10-CM | POA: Diagnosis not present

## 2023-03-26 DIAGNOSIS — I10 Essential (primary) hypertension: Secondary | ICD-10-CM | POA: Diagnosis not present

## 2023-03-26 DIAGNOSIS — I517 Cardiomegaly: Secondary | ICD-10-CM | POA: Diagnosis not present

## 2023-03-26 LAB — KAPPA/LAMBDA LIGHT CHAINS
Kappa free light chain: 62.4 mg/L — ABNORMAL HIGH (ref 3.3–19.4)
Kappa, lambda light chain ratio: 1.57 (ref 0.26–1.65)
Lambda free light chains: 39.8 mg/L — ABNORMAL HIGH (ref 5.7–26.3)

## 2023-03-31 LAB — MULTIPLE MYELOMA PANEL, SERUM
Albumin SerPl Elph-Mcnc: 3.6 g/dL (ref 2.9–4.4)
Albumin/Glob SerPl: 1.1 (ref 0.7–1.7)
Alpha 1: 0.2 g/dL (ref 0.0–0.4)
Alpha2 Glob SerPl Elph-Mcnc: 1.2 g/dL — ABNORMAL HIGH (ref 0.4–1.0)
B-Globulin SerPl Elph-Mcnc: 1.3 g/dL (ref 0.7–1.3)
Gamma Glob SerPl Elph-Mcnc: 0.9 g/dL (ref 0.4–1.8)
Globulin, Total: 3.6 g/dL (ref 2.2–3.9)
IgA: 270 mg/dL (ref 64–422)
IgG (Immunoglobin G), Serum: 1096 mg/dL (ref 586–1602)
IgM (Immunoglobulin M), Srm: 53 mg/dL (ref 26–217)
Total Protein ELP: 7.2 g/dL (ref 6.0–8.5)

## 2023-04-01 ENCOUNTER — Inpatient Hospital Stay: Payer: Medicare HMO | Attending: Oncology | Admitting: Oncology

## 2023-04-01 ENCOUNTER — Encounter: Payer: Self-pay | Admitting: Oncology

## 2023-04-01 VITALS — BP 119/72 | HR 81 | Temp 97.6°F | Resp 18 | Wt 199.9 lb

## 2023-04-01 DIAGNOSIS — N1831 Chronic kidney disease, stage 3a: Secondary | ICD-10-CM | POA: Insufficient documentation

## 2023-04-01 DIAGNOSIS — Z170 Estrogen receptor positive status [ER+]: Secondary | ICD-10-CM | POA: Insufficient documentation

## 2023-04-01 DIAGNOSIS — Z1721 Progesterone receptor positive status: Secondary | ICD-10-CM | POA: Insufficient documentation

## 2023-04-01 DIAGNOSIS — Z923 Personal history of irradiation: Secondary | ICD-10-CM | POA: Diagnosis not present

## 2023-04-01 DIAGNOSIS — D631 Anemia in chronic kidney disease: Secondary | ICD-10-CM | POA: Diagnosis not present

## 2023-04-01 DIAGNOSIS — Z79811 Long term (current) use of aromatase inhibitors: Secondary | ICD-10-CM | POA: Diagnosis not present

## 2023-04-01 DIAGNOSIS — D509 Iron deficiency anemia, unspecified: Secondary | ICD-10-CM | POA: Insufficient documentation

## 2023-04-01 DIAGNOSIS — C50212 Malignant neoplasm of upper-inner quadrant of left female breast: Secondary | ICD-10-CM | POA: Diagnosis not present

## 2023-04-01 MED ORDER — LETROZOLE 2.5 MG PO TABS: 2.5000 mg | ORAL_TABLET | Freq: Every day | ORAL | 1 refills | Status: DC

## 2023-04-01 NOTE — Assessment & Plan Note (Signed)
History of stage IA left breast cancer, ER positive, PR positive, HER2 negative. pTb pN0 On Letrozole since 03/31/2019, plan 5 years, till 03/2024 Labs reviewed and discussed with patient Continue letrozole 2.5mg  daily Recent mammgram results were reviewed with patient.  Obtain annual mammogram, due in July 2025.

## 2023-04-01 NOTE — Assessment & Plan Note (Signed)
Continue calcium and vitamin D supplementation. She has DEXA done in Dec 2022, normal result.  Reccommend to repeat every 2 years. Will repeat DEXA prior to next viist.

## 2023-04-01 NOTE — Assessment & Plan Note (Signed)
Encourage oral hydration and avoid nephrotoxins.   

## 2023-04-01 NOTE — Progress Notes (Signed)
Clinic Day:  04/01/2023  ASSESSMENT & PLAN:   Cancer Staging  Malignant neoplasm of upper-inner quadrant of left breast in female, estrogen receptor positive (HCC) Staging form: Breast, AJCC 8th Edition - Pathologic stage from 01/14/2019: Stage IA (pT1b, pN0, cM0, G1, ER+, PR+, HER2-) - Signed by Rickard Patience, MD on 05/18/2022   Malignant neoplasm of upper-inner quadrant of left breast in female, estrogen receptor positive (HCC) History of stage IA left breast cancer, ER positive, PR positive, HER2 negative. pTb pN0 On Letrozole since 03/31/2019, plan 5 years, till 03/2024 Labs reviewed and discussed with patient Continue letrozole 2.5mg  daily Recent mammgram results were reviewed with patient.  Obtain annual mammogram, due in July 2025.  Anemia in chronic kidney disease (CKD) Labs reviewed and discussed with patient,  Lab Results  Component Value Date   HGB 10.9 (L) 03/25/2023   TIBC 335 03/25/2023   IRONPCTSAT 19 03/25/2023   FERRITIN 56 03/25/2023   She declined IV Venofer.  Hemoglobin has improved since start of oral iron supplementation.  ferritin is <200 Recommend patient to continue ferrous sulfate 325mg  BID with vitamin C.    Aromatase inhibitor use Continue calcium and vitamin D supplementation. She has DEXA done in Dec 2022, normal result.  Reccommend to repeat every 2 years. Will repeat DEXA prior to next viist.   Stage 3a chronic kidney disease (CKD) (HCC) Encourage oral hydration and avoid nephrotoxins.    Orders Placed This Encounter  Procedures   DG Bone Density    Standing Status:   Future    Standing Expiration Date:   03/31/2024    Order Specific Question:   Reason for Exam (SYMPTOM  OR DIAGNOSIS REQUIRED)    Answer:   Breast cancer    Order Specific Question:   Preferred imaging location?    Answer:   [facility]   CMP (Cancer Center only)    Standing Status:   Future    Standing Expiration Date:   03/31/2024   CBC with Differential (Cancer  Center Only)    Standing Status:   Future    Standing Expiration Date:   03/31/2024   Iron and TIBC    Standing Status:   Future    Standing Expiration Date:   03/31/2024   Ferritin    Standing Status:   Future    Standing Expiration Date:   03/31/2024   Retic Panel    Standing Status:   Future    Standing Expiration Date:   03/31/2024   Follow up  in 6  months All questions were answered. The patient knows to call the clinic with any problems, questions or concerns.  Rickard Patience, MD, PhD [facility] Hematology Oncology 04/01/2023    Chief Complaint: Caitlin Ford is a 72 y.o. female presents for follow up of stage I left breast cancer and iron deficiency  PERTINENT HEMATOLOGY HISTORY Patient previously followed up by Dr.Corcoran, patient switched care to me on 01/03/21 Extensive medical record review was performed by me  Oncology History  Malignant neoplasm of upper-inner quadrant of left breast in female, estrogen receptor positive (HCC)  10/21/2018 Imaging   DEXA normal with a T-score of +0.3 in the left femoral neck and + 1.3 in AP spine L1-L4.   01/14/2019 Initial Diagnosis   Malignant neoplasm of upper-inner quadrant of left breast in female, estrogen receptor positive  - #01/14/2019.  Stage IA left breast cancer s/p lumpectomy and axillary lymph node biopsy. 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  Two sentinel lymph nodes negative for malignancy.  Tumor was ER + >90%, PR + >90%, and HER-2/neu negative (1+).  Pathologic stage was pT1b pN0.    01/14/2019 Cancer Staging   Staging form: Breast, AJCC 8th Edition - Pathologic stage from 01/14/2019: Stage IA (pT1b, pN0, cM0, G1, ER+, PR+, HER2-) - Signed by Rickard Patience, MD on 05/18/2022 Stage prefix: Initial diagnosis Histologic grading system: 3 grade system   02/16/2019 - 03/21/2019 Radiation Therapy   Adjuvant radiation   03/31/2019 -  Anti-estrogen oral therapy    Started on letrozole   11/29/2020 Mammogram   Bilateral diagnostic mammogram showed No mammographic evidence for malignancy. Stable lumpectomy changes left breast.    # iron deficiency anemia.   Gastroenterology work-up EGD on 07/23/2020 revealed erosive gastropathy with no stigmata of recent bleeding. Colonoscopy on 07/23/2020 revealed diverticulosis in the sigmoid colon, descending colon and ascending colon. There were non-bleeding internal hemorrhoids.   INTERVAL HISTORY Caitlin Ford is a 72 y.o. female who has above history reviewed by me today presents for follow up visit for history of stage Ia left breast cancer, and iron deficiency anemia Ports feeling well.  No new complaints.  She tolerates letrozole.   Past Medical History:  Diagnosis Date   Arthritis    Breast cancer (HCC)    Chronic kidney disease    Chronic Renal Insufficiency - Stage 2   DDD (degenerative disc disease), cervical    Diabetes mellitus without complication (HCC)    GERD (gastroesophageal reflux disease)    occ no meds   Goiter    Headache    simus   Hypercholesteremia    Hypertension    Hypothyroidism    Malignant neoplasm of upper-inner quadrant of left breast in female, estrogen receptor positive (HCC) 02/04/2019   Personal history of radiation therapy    Proteinuria    Wears dentures    partial upper, full lower    Past Surgical History:  Procedure Laterality Date   ABDOMINAL HYSTERECTOMY     BREAST BIOPSY Left 12/14/2018   [facility]   BREAST LUMPECTOMY Left 01/14/2019   [facility]   COLONOSCOPY WITH PROPOFOL N/A 02/09/2015   Procedure: COLONOSCOPY WITH PROPOFOL;  Surgeon: Wallace Cullens, MD;  Location: [facility] ENDOSCOPY;  Service: Gastroenterology;  Laterality: N/A;   COLONOSCOPY WITH PROPOFOL N/A 07/23/2020   Procedure: COLONOSCOPY WITH PROPOFOL;  Surgeon: Regis Bill, MD;  Location: [facility] ENDOSCOPY;  Service: Endoscopy;  Laterality: N/A;   ESOPHAGOGASTRODUODENOSCOPY (EGD) WITH PROPOFOL N/A  07/23/2020   Procedure: ESOPHAGOGASTRODUODENOSCOPY (EGD) WITH PROPOFOL;  Surgeon: Regis Bill, MD;  Location: [facility] ENDOSCOPY;  Service: Endoscopy;  Laterality: N/A;   PARTIAL MASTECTOMY WITH NEEDLE LOCALIZATION AND AXILLARY SENTINEL LYMPH NODE BX Left 01/14/2019   Procedure: PARTIAL MASTECTOMY WITH NEEDLE LOCALIZATION AND AXILLARY SENTINEL LYMPH NODE BX;  Surgeon: Sung Amabile, DO;  Location: [facility] ORS;  Service: General;  Laterality: Left;   THYROIDECTOMY      Family History  Problem Relation Age of Onset   Diabetes Mother    Diabetes Maternal Aunt    Diabetes Maternal Uncle    Breast cancer Neg Hx     Social History:  reports that she has never smoked. She has never used smokeless tobacco. She reports that she does not drink alcohol and does not use drugs.  Allergies:  Allergies  Allergen Reactions   Altace [Ramipril] Other (See Comments)    Angioedema  Avandia [Rosiglitazone] Other (See Comments)     Muscle Aches   Crestor [Rosuvastatin] Other (See Comments)    Muscle ache   Doxycycline Other (See Comments)    Muscle Aches   Lipitor [Atorvastatin] Other (See Comments)    Muscle ache    Current Medications: Current Outpatient Medications  Medication Sig Dispense Refill   aspirin EC 81 MG tablet Take 81 mg by mouth daily.      glimepiride (AMARYL) 4 MG tablet Take 4 mg by mouth in the morning and at bedtime. Combines this with the 2 mg glimepiride     levothyroxine (SYNTHROID) 88 MCG tablet Take by mouth.     losartan (COZAAR) 100 MG tablet Take 100 mg by mouth every morning.      metFORMIN (GLUCOPHAGE-XR) 500 MG 24 hr tablet Take 1,000 mg by mouth 2 (two) times daily.      metoprolol succinate (TOPROL-XL) 25 MG 24 hr tablet Take 25 mg by mouth daily. Unsure of dose/ just started 8/20     Multiple Vitamin (MULTI-VITAMIN) tablet Take 1 tablet by mouth daily.      ONETOUCH VERIO test strip 1 each 2 (two) times daily.     pioglitazone (ACTOS) 15 MG tablet Take by mouth.      letrozole (FEMARA) 2.5 MG tablet Take 1 tablet (2.5 mg total) by mouth daily. 90 tablet 1   No current facility-administered medications for this visit.   Review of Systems  Constitutional:  Negative for appetite change, chills, fatigue and fever.  HENT:   Negative for hearing loss and voice change.   Eyes:  Negative for eye problems.  Respiratory:  Negative for chest tightness and cough.   Cardiovascular:  Negative for chest pain.  Gastrointestinal:  Negative for abdominal distention, abdominal pain and blood in stool.  Endocrine: Negative for hot flashes.  Genitourinary:  Negative for difficulty urinating and frequency.   Musculoskeletal:  Negative for arthralgias.  Skin:  Negative for itching and rash.  Neurological:  Negative for extremity weakness.  Hematological:  Negative for adenopathy.  Psychiatric/Behavioral:  Negative for confusion.     Performance status (ECOG): 0  Vitals Blood pressure 119/72, pulse 81, temperature 97.6 F (36.4 C), resp. rate 18, weight 199 lb 14.4 oz (90.7 kg).  Physical Exam Constitutional:      General: She is not in acute distress.    Appearance: She is not diaphoretic.  HENT:     Head: Normocephalic and atraumatic.  Eyes:     General: No scleral icterus. Cardiovascular:     Rate and Rhythm: Normal rate and regular rhythm.  Pulmonary:     Effort: Pulmonary effort is normal. No respiratory distress.  Abdominal:     General: Bowel sounds are normal. There is no distension.     Palpations: Abdomen is soft.  Musculoskeletal:        General: Normal range of motion.     Cervical back: Normal range of motion and neck supple.  Skin:    General: Skin is warm and dry.     Findings: No erythema.  Neurological:     Mental Status: She is alert and oriented to person, place, and time.     Cranial Nerves: No cranial nerve deficit.     Motor: No abnormal muscle tone.     Coordination: Coordination normal.  Psychiatric:        Mood and Affect:  Mood and affect normal.       Labs are reviewed and  discussed with patient.    Latest Ref Rng & Units 03/25/2023   10:13 AM 12/24/2022   10:05 AM 05/16/2022    9:32 AM  CBC  WBC 4.0 - 10.5 K/uL 8.4  4.5  5.7   Hemoglobin 12.0 - 15.0 g/dL 16.1  09.6  04.5   Hematocrit 36.0 - 46.0 % 34.6  32.6  34.4   Platelets 150 - 400 K/uL 187  173  221        Latest Ref Rng & Units 12/24/2022   10:05 AM 05/16/2022    9:32 AM 11/05/2021   10:33 AM  CMP  Glucose 70 - 99 mg/dL 65  409  87   BUN 8 - 23 mg/dL 33  20  26   Creatinine 0.44 - 1.00 mg/dL 8.11  9.14  7.82   Sodium 135 - 145 mmol/L 138  139  141   Potassium 3.5 - 5.1 mmol/L 4.8  4.6  4.6   Chloride 98 - 111 mmol/L 107  106  108   CO2 22 - 32 mmol/L 25  24  25    Calcium 8.9 - 10.3 mg/dL 9.2  9.1  9.5   Total Protein 6.5 - 8.1 g/dL 7.6  7.7  7.6   Total Bilirubin 0.3 - 1.2 mg/dL 0.3  0.3  0.4   Alkaline Phos 38 - 126 U/L 62  89  63   AST 15 - 41 U/L 18  20  18    ALT 0 - 44 U/L 16  17  12      Iron/TIBC/Ferritin/ %Sat    Component Value Date/Time   IRON 62 03/25/2023 1013   TIBC 335 03/25/2023 1013   FERRITIN 56 03/25/2023 1013   IRONPCTSAT 19 03/25/2023 1013

## 2023-04-01 NOTE — Assessment & Plan Note (Addendum)
Labs reviewed and discussed with patient,  Lab Results  Component Value Date   HGB 10.9 (L) 03/25/2023   TIBC 335 03/25/2023   IRONPCTSAT 19 03/25/2023   FERRITIN 56 03/25/2023   She declined IV Venofer.  Hemoglobin has improved since start of oral iron supplementation.  ferritin is <200 Recommend patient to continue ferrous sulfate 325mg  BID with vitamin C.

## 2023-04-01 NOTE — Progress Notes (Signed)
Pt here for follow up. No new concerns voiced.   

## 2023-04-07 DIAGNOSIS — Z0100 Encounter for examination of eyes and vision without abnormal findings: Secondary | ICD-10-CM | POA: Diagnosis not present

## 2023-04-09 DIAGNOSIS — I517 Cardiomegaly: Secondary | ICD-10-CM | POA: Diagnosis not present

## 2023-04-21 DIAGNOSIS — E1159 Type 2 diabetes mellitus with other circulatory complications: Secondary | ICD-10-CM | POA: Diagnosis not present

## 2023-04-21 DIAGNOSIS — I152 Hypertension secondary to endocrine disorders: Secondary | ICD-10-CM | POA: Diagnosis not present

## 2023-04-21 DIAGNOSIS — I493 Ventricular premature depolarization: Secondary | ICD-10-CM | POA: Diagnosis not present

## 2023-04-21 DIAGNOSIS — E66811 Obesity, class 1: Secondary | ICD-10-CM | POA: Diagnosis not present

## 2023-04-22 DIAGNOSIS — M79672 Pain in left foot: Secondary | ICD-10-CM | POA: Diagnosis not present

## 2023-04-22 DIAGNOSIS — E119 Type 2 diabetes mellitus without complications: Secondary | ICD-10-CM | POA: Diagnosis not present

## 2023-06-03 DIAGNOSIS — Z008 Encounter for other general examination: Secondary | ICD-10-CM | POA: Diagnosis not present

## 2023-07-27 DIAGNOSIS — M10071 Idiopathic gout, right ankle and foot: Secondary | ICD-10-CM | POA: Diagnosis not present

## 2023-07-27 DIAGNOSIS — E119 Type 2 diabetes mellitus without complications: Secondary | ICD-10-CM | POA: Diagnosis not present

## 2023-08-04 ENCOUNTER — Other Ambulatory Visit: Payer: Self-pay | Admitting: Nephrology

## 2023-08-04 DIAGNOSIS — I10 Essential (primary) hypertension: Secondary | ICD-10-CM | POA: Diagnosis not present

## 2023-08-04 DIAGNOSIS — N1831 Chronic kidney disease, stage 3a: Secondary | ICD-10-CM | POA: Diagnosis not present

## 2023-08-04 DIAGNOSIS — N179 Acute kidney failure, unspecified: Secondary | ICD-10-CM | POA: Diagnosis not present

## 2023-08-04 DIAGNOSIS — E1122 Type 2 diabetes mellitus with diabetic chronic kidney disease: Secondary | ICD-10-CM | POA: Diagnosis not present

## 2023-08-06 ENCOUNTER — Ambulatory Visit
Admission: RE | Admit: 2023-08-06 | Discharge: 2023-08-06 | Disposition: A | Discharge status: Home / Self Care | Source: Ambulatory Visit | Attending: Nephrology | Admitting: Nephrology

## 2023-08-06 DIAGNOSIS — N189 Chronic kidney disease, unspecified: Secondary | ICD-10-CM | POA: Diagnosis not present

## 2023-08-06 DIAGNOSIS — N179 Acute kidney failure, unspecified: Secondary | ICD-10-CM | POA: Insufficient documentation

## 2023-08-06 DIAGNOSIS — N1831 Chronic kidney disease, stage 3a: Secondary | ICD-10-CM | POA: Diagnosis not present

## 2023-08-27 DIAGNOSIS — D631 Anemia in chronic kidney disease: Secondary | ICD-10-CM | POA: Diagnosis not present

## 2023-08-27 DIAGNOSIS — I10 Essential (primary) hypertension: Secondary | ICD-10-CM | POA: Diagnosis not present

## 2023-08-27 DIAGNOSIS — N179 Acute kidney failure, unspecified: Secondary | ICD-10-CM | POA: Diagnosis not present

## 2023-08-27 DIAGNOSIS — E1122 Type 2 diabetes mellitus with diabetic chronic kidney disease: Secondary | ICD-10-CM | POA: Diagnosis not present

## 2023-08-27 DIAGNOSIS — R768 Other specified abnormal immunological findings in serum: Secondary | ICD-10-CM | POA: Diagnosis not present

## 2023-08-27 DIAGNOSIS — N1831 Chronic kidney disease, stage 3a: Secondary | ICD-10-CM | POA: Diagnosis not present

## 2023-09-29 ENCOUNTER — Inpatient Hospital Stay: Payer: Medicare HMO | Attending: Oncology

## 2023-09-29 ENCOUNTER — Ambulatory Visit
Admission: RE | Admit: 2023-09-29 | Discharge: 2023-09-29 | Disposition: A | Discharge status: Home / Self Care | Payer: Medicare HMO | Source: Ambulatory Visit | Attending: Oncology | Admitting: Oncology

## 2023-09-29 DIAGNOSIS — Z780 Asymptomatic menopausal state: Secondary | ICD-10-CM | POA: Diagnosis not present

## 2023-09-29 DIAGNOSIS — D509 Iron deficiency anemia, unspecified: Secondary | ICD-10-CM | POA: Insufficient documentation

## 2023-09-29 DIAGNOSIS — Z170 Estrogen receptor positive status [ER+]: Secondary | ICD-10-CM | POA: Insufficient documentation

## 2023-09-29 DIAGNOSIS — Z923 Personal history of irradiation: Secondary | ICD-10-CM | POA: Insufficient documentation

## 2023-09-29 DIAGNOSIS — D631 Anemia in chronic kidney disease: Secondary | ICD-10-CM | POA: Insufficient documentation

## 2023-09-29 DIAGNOSIS — Z79811 Long term (current) use of aromatase inhibitors: Secondary | ICD-10-CM | POA: Insufficient documentation

## 2023-09-29 DIAGNOSIS — C50212 Malignant neoplasm of upper-inner quadrant of left female breast: Secondary | ICD-10-CM | POA: Diagnosis not present

## 2023-09-29 DIAGNOSIS — Z13820 Encounter for screening for osteoporosis: Secondary | ICD-10-CM | POA: Insufficient documentation

## 2023-09-29 DIAGNOSIS — E875 Hyperkalemia: Secondary | ICD-10-CM | POA: Diagnosis not present

## 2023-09-29 DIAGNOSIS — Z1721 Progesterone receptor positive status: Secondary | ICD-10-CM | POA: Diagnosis not present

## 2023-09-29 DIAGNOSIS — N1831 Chronic kidney disease, stage 3a: Secondary | ICD-10-CM | POA: Insufficient documentation

## 2023-09-29 DIAGNOSIS — Z1732 Human epidermal growth factor receptor 2 negative status: Secondary | ICD-10-CM | POA: Diagnosis not present

## 2023-09-29 LAB — CMP (CANCER CENTER ONLY)
ALT: 22 U/L (ref 0–44)
AST: 24 U/L (ref 15–41)
Albumin: 3.8 g/dL (ref 3.5–5.0)
Alkaline Phosphatase: 78 U/L (ref 38–126)
Anion gap: 10 (ref 5–15)
BUN: 38 mg/dL — ABNORMAL HIGH (ref 8–23)
CO2: 24 mmol/L (ref 22–32)
Calcium: 9.8 mg/dL (ref 8.9–10.3)
Chloride: 109 mmol/L (ref 98–111)
Creatinine: 2.28 mg/dL — ABNORMAL HIGH (ref 0.44–1.00)
GFR, Estimated: 22 mL/min — ABNORMAL LOW (ref >60)
Glucose, Bld: 122 mg/dL — ABNORMAL HIGH (ref 70–99)
Potassium: 5.4 mmol/L — ABNORMAL HIGH (ref 3.5–5.1)
Sodium: 143 mmol/L (ref 135–145)
Total Bilirubin: 0.3 mg/dL (ref 0.0–1.2)
Total Protein: 7.4 g/dL (ref 6.5–8.1)

## 2023-09-29 LAB — CBC WITH DIFFERENTIAL (CANCER CENTER ONLY)
Abs Immature Granulocytes: 0.02 10*3/uL (ref 0.00–0.07)
Basophils Absolute: 0 10*3/uL (ref 0.0–0.1)
Basophils Relative: 1 %
Eosinophils Absolute: 0.2 10*3/uL (ref 0.0–0.5)
Eosinophils Relative: 3 %
HCT: 29.4 % — ABNORMAL LOW (ref 36.0–46.0)
Hemoglobin: 9.2 g/dL — ABNORMAL LOW (ref 12.0–15.0)
Immature Granulocytes: 0 %
Lymphocytes Relative: 26 %
Lymphs Abs: 1.4 10*3/uL (ref 0.7–4.0)
MCH: 28.7 pg (ref 26.0–34.0)
MCHC: 31.3 g/dL (ref 30.0–36.0)
MCV: 91.6 fL (ref 80.0–100.0)
Monocytes Absolute: 0.5 10*3/uL (ref 0.1–1.0)
Monocytes Relative: 9 %
Neutro Abs: 3.2 10*3/uL (ref 1.7–7.7)
Neutrophils Relative %: 61 %
Platelet Count: 190 10*3/uL (ref 150–400)
RBC: 3.21 MIL/uL — ABNORMAL LOW (ref 3.87–5.11)
RDW: 15.1 % (ref 11.5–15.5)
WBC Count: 5.2 10*3/uL (ref 4.0–10.5)
nRBC: 0 % (ref 0.0–0.2)

## 2023-09-29 LAB — FERRITIN: Ferritin: 67 ng/mL (ref 11–307)

## 2023-09-29 LAB — IRON AND TIBC
Iron: 67 ug/dL (ref 28–170)
Saturation Ratios: 23 % (ref 10.4–31.8)
TIBC: 298 ug/dL (ref 250–450)
UIBC: 231 ug/dL

## 2023-09-29 LAB — RETIC PANEL
Immature Retic Fract: 12.3 % (ref 2.3–15.9)
RBC.: 3.16 MIL/uL — ABNORMAL LOW (ref 3.87–5.11)
Retic Count, Absolute: 37.6 10*3/uL (ref 19.0–186.0)
Retic Ct Pct: 1.2 % (ref 0.4–3.1)
Reticulocyte Hemoglobin: 33.4 pg (ref >27.9)

## 2023-10-06 ENCOUNTER — Inpatient Hospital Stay (HOSPITAL_BASED_OUTPATIENT_CLINIC_OR_DEPARTMENT_OTHER): Payer: Medicare HMO | Admitting: Oncology

## 2023-10-06 ENCOUNTER — Encounter: Payer: Self-pay | Admitting: Oncology

## 2023-10-06 VITALS — BP 130/75 | HR 73 | Temp 98.7°F | Resp 18 | Wt 184.1 lb

## 2023-10-06 DIAGNOSIS — C50212 Malignant neoplasm of upper-inner quadrant of left female breast: Secondary | ICD-10-CM | POA: Diagnosis not present

## 2023-10-06 DIAGNOSIS — D509 Iron deficiency anemia, unspecified: Secondary | ICD-10-CM | POA: Diagnosis not present

## 2023-10-06 DIAGNOSIS — E875 Hyperkalemia: Secondary | ICD-10-CM | POA: Insufficient documentation

## 2023-10-06 DIAGNOSIS — D631 Anemia in chronic kidney disease: Secondary | ICD-10-CM

## 2023-10-06 DIAGNOSIS — N1831 Chronic kidney disease, stage 3a: Secondary | ICD-10-CM

## 2023-10-06 DIAGNOSIS — Z1721 Progesterone receptor positive status: Secondary | ICD-10-CM | POA: Diagnosis not present

## 2023-10-06 DIAGNOSIS — Z923 Personal history of irradiation: Secondary | ICD-10-CM | POA: Diagnosis not present

## 2023-10-06 DIAGNOSIS — Z170 Estrogen receptor positive status [ER+]: Secondary | ICD-10-CM

## 2023-10-06 DIAGNOSIS — Z79811 Long term (current) use of aromatase inhibitors: Secondary | ICD-10-CM

## 2023-10-06 DIAGNOSIS — Z1732 Human epidermal growth factor receptor 2 negative status: Secondary | ICD-10-CM | POA: Diagnosis not present

## 2023-10-06 MED ORDER — LETROZOLE 2.5 MG PO TABS: 2.5000 mg | ORAL_TABLET | Freq: Every day | ORAL | 1 refills | Status: AC

## 2023-10-06 NOTE — Assessment & Plan Note (Signed)
 Recommend low K diet. Follow up with nephrology.

## 2023-10-06 NOTE — Progress Notes (Signed)
 CMP results faxed to Dr. Donel Fujisawa office per MD request

## 2023-10-06 NOTE — Assessment & Plan Note (Signed)
 Cr is getting worse. She follows up with nephrology.  CMP result is faxed over to Dr. Donel Fujisawa office.

## 2023-10-06 NOTE — Assessment & Plan Note (Addendum)
 Labs reviewed and discussed with patient,  Lab Results  Component Value Date   HGB 9.2 (L) 09/29/2023   TIBC 298 09/29/2023   IRONPCTSAT 23 09/29/2023   FERRITIN 67 09/29/2023   She declined IV Venofer.  ferritin is <200 Recommend patient to continue ferrous sulfate 325mg  BID with vitamin C.

## 2023-10-06 NOTE — Progress Notes (Signed)
 Hematology/Oncology Progress note [phone] [phone]      Clinic Day:  10/06/2023  ASSESSMENT & PLAN:   Cancer Staging  Malignant neoplasm of upper-inner quadrant of left breast in female, estrogen receptor positive (HCC) Staging form: Breast, AJCC 8th Edition - Pathologic stage from 01/14/2019: Stage IA (pT1b, pN0, cM0, G1, ER+, PR+, HER2-) - Signed by Timmy Forbes, MD on 05/18/2022   Malignant neoplasm of upper-inner quadrant of left breast in female, estrogen receptor positive (HCC) History of stage IA left breast cancer, ER positive, PR positive, HER2 negative. pT1b pN0 On Letrozole  since 03/31/2019, plan 5 years, till 03/2024 Labs reviewed and discussed with patient Continue letrozole  2.5mg  daily Obtain annual mammogram, due in July 2025.  Anemia in chronic kidney disease (CKD) Labs reviewed and discussed with patient,  Lab Results  Component Value Date   HGB 9.2 (L) 09/29/2023   TIBC 298 09/29/2023   IRONPCTSAT 23 09/29/2023   FERRITIN 67 09/29/2023   She declined IV Venofer.  ferritin is <200 Recommend patient to continue ferrous sulfate 325mg  BID with vitamin C.    Aromatase inhibitor use Continue calcium and vitamin D supplementation. She has DEXA done in May 2025  normal result.    Stage 3a chronic kidney disease (CKD) (HCC) Cr is getting worse. She follows up with nephrology.  CMP result is faxed over to Dr. Donel Fujisawa office.   Hyperkalemia Recommend low K diet. Follow up with nephrology.    Orders Placed This Encounter  Procedures   MM 3D SCREEN BREAST BILATERAL    Standing Status:   Future    Expected Date:   12/06/2023    Expiration Date:   10/06/2024    Reason for Exam (SYMPTOM  OR DIAGNOSIS REQUIRED):   history of breast cancer    Preferred imaging [facility]   CMP (Cancer Center only)    Standing Status:   Future    Expected Date:   04/07/2024    Expiration Date:   10/05/2024   CBC with Differential  (Cancer Center Only)    Standing Status:   Future    Expected Date:   04/07/2024    Expiration Date:   10/05/2024   Follow up  in 6  months All questions were answered. The patient knows to call the clinic with any problems, questions or concerns.  Timmy Forbes, MD, PhD [facility] Hematology Oncology 10/06/2023    Chief Complaint: Caitlin Ford is a 73 y.o. female presents for follow up of stage I left breast cancer and iron deficiency  PERTINENT HEMATOLOGY HISTORY Patient previously followed up by Dr.Corcoran, patient switched care to me on 01/03/21 Extensive medical record review was performed by me  Oncology History  Malignant neoplasm of upper-inner quadrant of left breast in female, estrogen receptor positive (HCC)  10/21/2018 Imaging   DEXA normal with a T-score of +0.3 in the left femoral neck and + 1.3 in AP spine L1-L4.   01/14/2019 Initial Diagnosis   Malignant neoplasm of upper-inner quadrant of left breast in female, estrogen receptor positive  - #01/14/2019.  Stage IA left breast cancer s/p lumpectomy and axillary lymph node biopsy. 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  Two sentinel lymph nodes negative for malignancy.  Tumor was ER + >90%, PR + >90%, and HER-2/neu negative (1+).  Pathologic stage was pT1b pN0.    01/14/2019 Cancer Staging   Staging form: Breast, AJCC 8th Edition -  Pathologic stage from 01/14/2019: Stage IA (pT1b, pN0, cM0, G1, ER+, PR+, HER2-) - Signed by Timmy Forbes, MD on 05/18/2022 Stage prefix: Initial diagnosis Histologic grading system: 3 grade system   02/16/2019 - 03/21/2019 Radiation Therapy   Adjuvant radiation   03/31/2019 -  Anti-estrogen oral therapy   Started on letrozole    11/29/2020 Mammogram   Bilateral diagnostic mammogram showed No mammographic evidence for malignancy. Stable lumpectomy changes left breast.    # iron deficiency anemia.   Gastroenterology work-up EGD on  07/23/2020 revealed erosive gastropathy with no stigmata of recent bleeding. Colonoscopy on 07/23/2020 revealed diverticulosis in the sigmoid colon, descending colon and ascending colon. There were non-bleeding internal hemorrhoids.   INTERVAL HISTORY Caitlin Ford is a 73 y.o. female who has above history reviewed by me today presents for follow up visit for history of stage Ia left breast cancer, and iron deficiency anemia Ports feeling well.  No new complaints.  She tolerates letrozole .   Past Medical History:  Diagnosis Date   Arthritis    Breast cancer (HCC)    Chronic kidney disease    Chronic Renal Insufficiency - Stage 2   DDD (degenerative disc disease), cervical    Diabetes mellitus without complication (HCC)    GERD (gastroesophageal reflux disease)    occ no meds   Goiter    Headache    simus   Hypercholesteremia    Hypertension    Hypothyroidism    Malignant neoplasm of upper-inner quadrant of left breast in female, estrogen receptor positive (HCC) 02/04/2019   Personal history of radiation therapy    Proteinuria    Wears dentures    partial upper, full lower    Past Surgical History:  Procedure Laterality Date   ABDOMINAL HYSTERECTOMY     BREAST BIOPSY Left 12/14/2018   [facility]   BREAST LUMPECTOMY Left 01/14/2019   [facility]   COLONOSCOPY WITH PROPOFOL  N/A 02/09/2015   Procedure: COLONOSCOPY WITH PROPOFOL ;  Surgeon: Stephens Eis, MD;  Location: [facility] ENDOSCOPY;  Service: Gastroenterology;  Laterality: N/A;   COLONOSCOPY WITH PROPOFOL  N/A 07/23/2020   Procedure: COLONOSCOPY WITH PROPOFOL ;  Surgeon: Shane Darling, MD;  Location: [facility] ENDOSCOPY;  Service: Endoscopy;  Laterality: N/A;   ESOPHAGOGASTRODUODENOSCOPY (EGD) WITH PROPOFOL  N/A 07/23/2020   Procedure: ESOPHAGOGASTRODUODENOSCOPY (EGD) WITH PROPOFOL ;  Surgeon: Shane Darling, MD;  Location: [facility] ENDOSCOPY;  Service: Endoscopy;  Laterality: N/A;   PARTIAL MASTECTOMY WITH NEEDLE LOCALIZATION AND AXILLARY  SENTINEL LYMPH NODE BX Left 01/14/2019   Procedure: PARTIAL MASTECTOMY WITH NEEDLE LOCALIZATION AND AXILLARY SENTINEL LYMPH NODE BX;  Surgeon: Conrado Delay, DO;  Location: [facility] ORS;  Service: General;  Laterality: Left;   THYROIDECTOMY      Family History  Problem Relation Age of Onset   Diabetes Mother    Diabetes Maternal Aunt    Diabetes Maternal Uncle    Breast cancer Neg Hx     Social History:  reports that she has never smoked. She has never used smokeless tobacco. She reports that she does not drink alcohol and does not use drugs.  Allergies:  Allergies  Allergen Reactions   Altace [Ramipril] Other (See Comments)    Angioedema   Avandia [Rosiglitazone] Other (See Comments)     Muscle Aches   Crestor [Rosuvastatin] Other (See Comments)    Muscle ache   Doxycycline Other (See Comments)    Muscle Aches   Lipitor [Atorvastatin] Other (See Comments)    Muscle ache    Current Medications: Current  Outpatient Medications  Medication Sig Dispense Refill   aspirin EC 81 MG tablet Take 81 mg by mouth daily.      glimepiride (AMARYL) 4 MG tablet Take 4 mg by mouth in the morning and at bedtime. Combines this with the 2 mg glimepiride     levothyroxine (SYNTHROID) 88 MCG tablet Take by mouth.     losartan (COZAAR) 100 MG tablet Take 100 mg by mouth every morning.      metFORMIN (GLUCOPHAGE-XR) 500 MG 24 hr tablet Take 1,000 mg by mouth 2 (two) times daily.      metoprolol succinate (TOPROL-XL) 25 MG 24 hr tablet Take 25 mg by mouth daily. Unsure of dose/ just started 8/20     Multiple Vitamin (MULTI-VITAMIN) tablet Take 1 tablet by mouth daily.      ONETOUCH VERIO test strip 1 each 2 (two) times daily.     pioglitazone (ACTOS) 15 MG tablet Take by mouth.     letrozole  (FEMARA ) 2.5 MG tablet Take 1 tablet (2.5 mg total) by mouth daily. 90 tablet 1   No current facility-administered medications for this visit.   Review of Systems  Constitutional:  Negative for appetite change,  chills, fatigue and fever.  HENT:   Negative for hearing loss and voice change.   Eyes:  Negative for eye problems.  Respiratory:  Negative for chest tightness and cough.   Cardiovascular:  Negative for chest pain.  Gastrointestinal:  Negative for abdominal distention, abdominal pain and blood in stool.  Endocrine: Negative for hot flashes.  Genitourinary:  Negative for difficulty urinating and frequency.   Musculoskeletal:  Negative for arthralgias.  Skin:  Negative for itching and rash.  Neurological:  Negative for extremity weakness.  Hematological:  Negative for adenopathy.  Psychiatric/Behavioral:  Negative for confusion.     Performance status (ECOG): 0  Vitals Blood pressure 130/75, pulse 73, temperature 98.7 F (37.1 C), temperature source Tympanic, resp. rate 18, weight 184 lb 1.6 oz (83.5 kg), SpO2 100%.  Physical Exam Constitutional:      General: She is not in acute distress.    Appearance: She is not diaphoretic.  HENT:     Head: Normocephalic and atraumatic.  Eyes:     General: No scleral icterus. Cardiovascular:     Rate and Rhythm: Normal rate and regular rhythm.  Pulmonary:     Effort: Pulmonary effort is normal. No respiratory distress.  Abdominal:     General: Bowel sounds are normal. There is no distension.     Palpations: Abdomen is soft.  Musculoskeletal:        General: Normal range of motion.     Cervical back: Normal range of motion and neck supple.  Skin:    General: Skin is warm and dry.     Findings: No erythema.  Neurological:     Mental Status: She is alert and oriented to person, place, and time.     Cranial Nerves: No cranial nerve deficit.     Motor: No abnormal muscle tone.     Coordination: Coordination normal.  Psychiatric:        Mood and Affect: Mood and affect normal.       Labs are reviewed and discussed with patient.    Latest Ref Rng & Units 09/29/2023    8:47 AM 03/25/2023   10:13 AM 12/24/2022   10:05 AM  CBC  WBC  4.0 - 10.5 K/uL 5.2  8.4  4.5   Hemoglobin 12.0 - 15.0 g/dL  9.2  10.9  10.3   Hematocrit 36.0 - 46.0 % 29.4  34.6  32.6   Platelets 150 - 400 K/uL 190  187  173        Latest Ref Rng & Units 09/29/2023    8:47 AM 12/24/2022   10:05 AM 05/16/2022    9:32 AM  CMP  Glucose 70 - 99 mg/dL 578  65  469   BUN 8 - 23 mg/dL 38  33  20   Creatinine 0.44 - 1.00 mg/dL 6.29  5.28  4.13   Sodium 135 - 145 mmol/L 143  138  139   Potassium 3.5 - 5.1 mmol/L 5.4  4.8  4.6   Chloride 98 - 111 mmol/L 109  107  106   CO2 22 - 32 mmol/L 24  25  24    Calcium 8.9 - 10.3 mg/dL 9.8  9.2  9.1   Total Protein 6.5 - 8.1 g/dL 7.4  7.6  7.7   Total Bilirubin 0.0 - 1.2 mg/dL 0.3  0.3  0.3   Alkaline Phos 38 - 126 U/L 78  62  89   AST 15 - 41 U/L 24  18  20    ALT 0 - 44 U/L 22  16  17      Iron/TIBC/Ferritin/ %Sat    Component Value Date/Time   IRON 67 09/29/2023 0847   TIBC 298 09/29/2023 0847   FERRITIN 67 09/29/2023 0847   IRONPCTSAT 23 09/29/2023 0847

## 2023-10-06 NOTE — Assessment & Plan Note (Addendum)
 History of stage IA left breast cancer, ER positive, PR positive, HER2 negative. pT1b pN0 On Letrozole  since 03/31/2019, plan 5 years, till 03/2024 Labs reviewed and discussed with patient Continue letrozole  2.5mg  daily Obtain annual mammogram, due in July 2025.

## 2023-10-06 NOTE — Assessment & Plan Note (Addendum)
 Continue calcium and vitamin D supplementation. She has DEXA done in May 2025  normal result.

## 2023-10-15 DIAGNOSIS — R768 Other specified abnormal immunological findings in serum: Secondary | ICD-10-CM | POA: Diagnosis not present

## 2023-10-15 DIAGNOSIS — R682 Dry mouth, unspecified: Secondary | ICD-10-CM | POA: Diagnosis not present

## 2023-10-15 DIAGNOSIS — R899 Unspecified abnormal finding in specimens from other organs, systems and tissues: Secondary | ICD-10-CM | POA: Diagnosis not present

## 2023-11-02 DIAGNOSIS — E1165 Type 2 diabetes mellitus with hyperglycemia: Secondary | ICD-10-CM | POA: Diagnosis not present

## 2023-11-02 DIAGNOSIS — E1129 Type 2 diabetes mellitus with other diabetic kidney complication: Secondary | ICD-10-CM | POA: Diagnosis not present

## 2023-11-02 DIAGNOSIS — R809 Proteinuria, unspecified: Secondary | ICD-10-CM | POA: Diagnosis not present

## 2023-12-28 DIAGNOSIS — I493 Ventricular premature depolarization: Secondary | ICD-10-CM | POA: Diagnosis not present

## 2023-12-28 DIAGNOSIS — E1169 Type 2 diabetes mellitus with other specified complication: Secondary | ICD-10-CM | POA: Diagnosis not present

## 2023-12-28 DIAGNOSIS — I152 Hypertension secondary to endocrine disorders: Secondary | ICD-10-CM | POA: Diagnosis not present

## 2023-12-28 DIAGNOSIS — D631 Anemia in chronic kidney disease: Secondary | ICD-10-CM | POA: Diagnosis not present

## 2023-12-28 DIAGNOSIS — E1159 Type 2 diabetes mellitus with other circulatory complications: Secondary | ICD-10-CM | POA: Diagnosis not present

## 2023-12-28 DIAGNOSIS — E782 Mixed hyperlipidemia: Secondary | ICD-10-CM | POA: Diagnosis not present

## 2023-12-28 DIAGNOSIS — E66811 Obesity, class 1: Secondary | ICD-10-CM | POA: Diagnosis not present

## 2023-12-28 DIAGNOSIS — I6523 Occlusion and stenosis of bilateral carotid arteries: Secondary | ICD-10-CM | POA: Diagnosis not present

## 2023-12-28 DIAGNOSIS — N189 Chronic kidney disease, unspecified: Secondary | ICD-10-CM | POA: Diagnosis not present

## 2023-12-31 DIAGNOSIS — D631 Anemia in chronic kidney disease: Secondary | ICD-10-CM | POA: Diagnosis not present

## 2023-12-31 DIAGNOSIS — N1831 Chronic kidney disease, stage 3a: Secondary | ICD-10-CM | POA: Diagnosis not present

## 2023-12-31 DIAGNOSIS — N179 Acute kidney failure, unspecified: Secondary | ICD-10-CM | POA: Diagnosis not present

## 2023-12-31 DIAGNOSIS — E1122 Type 2 diabetes mellitus with diabetic chronic kidney disease: Secondary | ICD-10-CM | POA: Diagnosis not present

## 2023-12-31 DIAGNOSIS — I10 Essential (primary) hypertension: Secondary | ICD-10-CM | POA: Diagnosis not present

## 2024-01-06 ENCOUNTER — Ambulatory Visit
Admission: RE | Admit: 2024-01-06 | Discharge: 2024-01-06 | Disposition: A | Discharge status: Home / Self Care | Source: Ambulatory Visit | Attending: Oncology | Admitting: Oncology

## 2024-01-06 DIAGNOSIS — Z170 Estrogen receptor positive status [ER+]: Secondary | ICD-10-CM | POA: Diagnosis not present

## 2024-01-06 DIAGNOSIS — Z1231 Encounter for screening mammogram for malignant neoplasm of breast: Secondary | ICD-10-CM | POA: Diagnosis not present

## 2024-01-06 DIAGNOSIS — C50212 Malignant neoplasm of upper-inner quadrant of left female breast: Secondary | ICD-10-CM | POA: Diagnosis not present

## 2024-01-26 DIAGNOSIS — E1122 Type 2 diabetes mellitus with diabetic chronic kidney disease: Secondary | ICD-10-CM | POA: Diagnosis not present

## 2024-01-26 DIAGNOSIS — Z853 Personal history of malignant neoplasm of breast: Secondary | ICD-10-CM | POA: Diagnosis not present

## 2024-01-26 DIAGNOSIS — I152 Hypertension secondary to endocrine disorders: Secondary | ICD-10-CM | POA: Diagnosis not present

## 2024-01-26 DIAGNOSIS — J301 Allergic rhinitis due to pollen: Secondary | ICD-10-CM | POA: Diagnosis not present

## 2024-01-26 DIAGNOSIS — E1169 Type 2 diabetes mellitus with other specified complication: Secondary | ICD-10-CM | POA: Diagnosis not present

## 2024-01-26 DIAGNOSIS — Z23 Encounter for immunization: Secondary | ICD-10-CM | POA: Diagnosis not present

## 2024-01-26 DIAGNOSIS — E1159 Type 2 diabetes mellitus with other circulatory complications: Secondary | ICD-10-CM | POA: Diagnosis not present

## 2024-01-26 DIAGNOSIS — N1831 Chronic kidney disease, stage 3a: Secondary | ICD-10-CM | POA: Diagnosis not present

## 2024-01-26 DIAGNOSIS — E669 Obesity, unspecified: Secondary | ICD-10-CM | POA: Diagnosis not present

## 2024-01-26 DIAGNOSIS — Z1331 Encounter for screening for depression: Secondary | ICD-10-CM | POA: Diagnosis not present

## 2024-01-26 DIAGNOSIS — Z0000 Encounter for general adult medical examination without abnormal findings: Secondary | ICD-10-CM | POA: Diagnosis not present

## 2024-01-26 DIAGNOSIS — E890 Postprocedural hypothyroidism: Secondary | ICD-10-CM | POA: Diagnosis not present

## 2024-01-26 DIAGNOSIS — Z79899 Other long term (current) drug therapy: Secondary | ICD-10-CM | POA: Diagnosis not present

## 2024-02-10 ENCOUNTER — Encounter: Payer: Self-pay | Admitting: Nephrology

## 2024-03-07 DIAGNOSIS — E785 Hyperlipidemia, unspecified: Secondary | ICD-10-CM | POA: Diagnosis not present

## 2024-03-07 DIAGNOSIS — E1169 Type 2 diabetes mellitus with other specified complication: Secondary | ICD-10-CM | POA: Diagnosis not present

## 2024-03-07 DIAGNOSIS — E890 Postprocedural hypothyroidism: Secondary | ICD-10-CM | POA: Diagnosis not present

## 2024-03-07 DIAGNOSIS — E1129 Type 2 diabetes mellitus with other diabetic kidney complication: Secondary | ICD-10-CM | POA: Diagnosis not present

## 2024-03-07 DIAGNOSIS — I152 Hypertension secondary to endocrine disorders: Secondary | ICD-10-CM | POA: Diagnosis not present

## 2024-03-07 DIAGNOSIS — R809 Proteinuria, unspecified: Secondary | ICD-10-CM | POA: Diagnosis not present

## 2024-03-07 DIAGNOSIS — E1159 Type 2 diabetes mellitus with other circulatory complications: Secondary | ICD-10-CM | POA: Diagnosis not present

## 2024-03-30 DIAGNOSIS — E1122 Type 2 diabetes mellitus with diabetic chronic kidney disease: Secondary | ICD-10-CM | POA: Diagnosis not present

## 2024-03-30 DIAGNOSIS — N179 Acute kidney failure, unspecified: Secondary | ICD-10-CM | POA: Diagnosis not present

## 2024-03-30 DIAGNOSIS — R7689 Other specified abnormal immunological findings in serum: Secondary | ICD-10-CM | POA: Diagnosis not present

## 2024-03-30 DIAGNOSIS — N1831 Chronic kidney disease, stage 3a: Secondary | ICD-10-CM | POA: Diagnosis not present

## 2024-03-30 DIAGNOSIS — I10 Essential (primary) hypertension: Secondary | ICD-10-CM | POA: Diagnosis not present

## 2024-03-30 DIAGNOSIS — D631 Anemia in chronic kidney disease: Secondary | ICD-10-CM | POA: Diagnosis not present

## 2024-04-06 DIAGNOSIS — E1122 Type 2 diabetes mellitus with diabetic chronic kidney disease: Secondary | ICD-10-CM | POA: Diagnosis not present

## 2024-04-06 DIAGNOSIS — N184 Chronic kidney disease, stage 4 (severe): Secondary | ICD-10-CM | POA: Diagnosis not present

## 2024-04-06 DIAGNOSIS — I10 Essential (primary) hypertension: Secondary | ICD-10-CM | POA: Diagnosis not present

## 2024-04-06 DIAGNOSIS — Z7984 Long term (current) use of oral hypoglycemic drugs: Secondary | ICD-10-CM | POA: Diagnosis not present

## 2024-04-06 DIAGNOSIS — N2581 Secondary hyperparathyroidism of renal origin: Secondary | ICD-10-CM | POA: Diagnosis not present

## 2024-04-06 DIAGNOSIS — D631 Anemia in chronic kidney disease: Secondary | ICD-10-CM | POA: Diagnosis not present

## 2024-04-06 DIAGNOSIS — R809 Proteinuria, unspecified: Secondary | ICD-10-CM | POA: Diagnosis not present

## 2024-04-07 ENCOUNTER — Encounter: Payer: Self-pay | Admitting: Oncology

## 2024-04-07 ENCOUNTER — Inpatient Hospital Stay: Attending: Oncology

## 2024-04-07 ENCOUNTER — Inpatient Hospital Stay: Admitting: Oncology

## 2024-04-07 VITALS — BP 145/58 | HR 70 | Temp 98.5°F | Resp 18 | Wt 195.4 lb

## 2024-04-07 DIAGNOSIS — N1831 Chronic kidney disease, stage 3a: Secondary | ICD-10-CM | POA: Insufficient documentation

## 2024-04-07 DIAGNOSIS — Z170 Estrogen receptor positive status [ER+]: Secondary | ICD-10-CM | POA: Diagnosis not present

## 2024-04-07 DIAGNOSIS — Z79811 Long term (current) use of aromatase inhibitors: Secondary | ICD-10-CM

## 2024-04-07 DIAGNOSIS — C50212 Malignant neoplasm of upper-inner quadrant of left female breast: Secondary | ICD-10-CM

## 2024-04-07 DIAGNOSIS — E875 Hyperkalemia: Secondary | ICD-10-CM | POA: Diagnosis not present

## 2024-04-07 DIAGNOSIS — D631 Anemia in chronic kidney disease: Secondary | ICD-10-CM | POA: Diagnosis not present

## 2024-04-07 DIAGNOSIS — Z853 Personal history of malignant neoplasm of breast: Secondary | ICD-10-CM | POA: Diagnosis not present

## 2024-04-07 LAB — CBC WITH DIFFERENTIAL (CANCER CENTER ONLY)
Abs Immature Granulocytes: 0.03 K/uL (ref 0.00–0.07)
Basophils Absolute: 0 K/uL (ref 0.0–0.1)
Basophils Relative: 1 %
Eosinophils Absolute: 0.2 K/uL (ref 0.0–0.5)
Eosinophils Relative: 3 %
HCT: 30.7 % — ABNORMAL LOW (ref 36.0–46.0)
Hemoglobin: 9.8 g/dL — ABNORMAL LOW (ref 12.0–15.0)
Immature Granulocytes: 1 %
Lymphocytes Relative: 27 %
Lymphs Abs: 1.2 K/uL (ref 0.7–4.0)
MCH: 28.9 pg (ref 26.0–34.0)
MCHC: 31.9 g/dL (ref 30.0–36.0)
MCV: 90.6 fL (ref 80.0–100.0)
Monocytes Absolute: 0.5 K/uL (ref 0.1–1.0)
Monocytes Relative: 10 %
Neutro Abs: 2.6 K/uL (ref 1.7–7.7)
Neutrophils Relative %: 58 %
Platelet Count: 189 K/uL (ref 150–400)
RBC: 3.39 MIL/uL — ABNORMAL LOW (ref 3.87–5.11)
RDW: 14.5 % (ref 11.5–15.5)
WBC Count: 4.5 K/uL (ref 4.0–10.5)
nRBC: 0 % (ref 0.0–0.2)

## 2024-04-07 LAB — CMP (CANCER CENTER ONLY)
ALT: 24 U/L (ref 0–44)
AST: 22 U/L (ref 15–41)
Albumin: 3.6 g/dL (ref 3.5–5.0)
Alkaline Phosphatase: 86 U/L (ref 38–126)
Anion gap: 9 (ref 5–15)
BUN: 43 mg/dL — ABNORMAL HIGH (ref 8–23)
CO2: 22 mmol/L (ref 22–32)
Calcium: 9.5 mg/dL (ref 8.9–10.3)
Chloride: 109 mmol/L (ref 98–111)
Creatinine: 2.12 mg/dL — ABNORMAL HIGH (ref 0.44–1.00)
GFR, Estimated: 24 mL/min — ABNORMAL LOW (ref >60)
Glucose, Bld: 165 mg/dL — ABNORMAL HIGH (ref 70–99)
Potassium: 5.1 mmol/L (ref 3.5–5.1)
Sodium: 140 mmol/L (ref 135–145)
Total Bilirubin: 0.4 mg/dL (ref 0.0–1.2)
Total Protein: 7.3 g/dL (ref 6.5–8.1)

## 2024-04-07 NOTE — Assessment & Plan Note (Signed)
 Labs reviewed and discussed with patient,  Lab Results  Component Value Date   HGB 9.8 (L) 04/07/2024   TIBC 298 09/29/2023   IRONPCTSAT 23 09/29/2023   FERRITIN 67 09/29/2023   She previously declined IV Venofer.  ferritin is <200. Hb has slightly improved after she started oral iron.  Recommend patient to continue ferrous sulfate 325mg  BID with vitamin C.

## 2024-04-07 NOTE — Assessment & Plan Note (Signed)
 Continue calcium and vitamin D supplementation. She has DEXA done in May 2025  normal result.

## 2024-04-07 NOTE — Progress Notes (Signed)
 Hematology/Oncology Progress note [phone] [phone]      Clinic Day:  04/07/2024  ASSESSMENT & PLAN:   Cancer Staging  Malignant neoplasm of upper-inner quadrant of left breast in female, estrogen receptor positive (HCC) Staging form: Breast, AJCC 8th Edition - Pathologic stage from 01/14/2019: Stage IA (pT1b, pN0, cM0, G1, ER+, PR+, HER2-) - Signed by Babara Call, MD on 05/18/2022   Malignant neoplasm of upper-inner quadrant of left breast in female, estrogen receptor positive (HCC) History of stage IA left breast cancer, ER positive, PR positive, HER2 negative. pT1b pN0  Labs reviewed and discussed with patient Finished 5 years of Letrozole   03/31/2019, plan 5 years, till 03/2024 Recommend patient to finish her current supply and stop Obtain annual mammogram, next due in August  2026  Anemia in chronic kidney disease (CKD) Labs reviewed and discussed with patient,  Lab Results  Component Value Date   HGB 9.8 (L) 04/07/2024   TIBC 298 09/29/2023   IRONPCTSAT 23 09/29/2023   FERRITIN 67 09/29/2023   She previously declined IV Venofer.  ferritin is <200. Hb has slightly improved after she started oral iron.  Recommend patient to continue ferrous sulfate 325mg  BID with vitamin C.    Aromatase inhibitor use Continue calcium and vitamin D supplementation. She has DEXA done in May 2025  normal result.    Hyperkalemia Recommend low K diet. Follow up with nephrology.   Stage 3a chronic kidney disease (CKD) (HCC) She follows up with nephrology.  Avoid nephrotoxins   Orders Placed This Encounter  Procedures   CBC with Differential (Cancer Center Only)    Standing Status:   Future    Expected Date:   10/05/2024    Expiration Date:   01/03/2025   CMP (Cancer Center only)    Standing Status:   Future    Expected Date:   10/05/2024    Expiration Date:   01/03/2025   Iron and TIBC    Standing Status:   Future    Expected Date:   10/05/2024     Expiration Date:   01/03/2025   Ferritin    Standing Status:   Future    Expected Date:   10/05/2024    Expiration Date:   01/03/2025   Follow up  in 6  months All questions were answered. The patient knows to call the clinic with any problems, questions or concerns.  Call Babara, MD, PhD [facility] Hematology Oncology 04/07/2024    Chief Complaint: Caitlin Ford is a 73 y.o. female presents for follow up of stage I left breast cancer and iron deficiency  PERTINENT HEMATOLOGY HISTORY Patient previously followed up by Dr.Corcoran, patient switched care to me on 01/03/21 Extensive medical record review was performed by me  Oncology History  Malignant neoplasm of upper-inner quadrant of left breast in female, estrogen receptor positive (HCC)  10/21/2018 Imaging   DEXA normal with a T-score of +0.3 in the left femoral neck and + 1.3 in AP spine L1-L4.   01/14/2019 Initial Diagnosis   Malignant neoplasm of upper-inner quadrant of left breast in female, estrogen receptor positive  - #01/14/2019.  Stage IA left breast cancer s/p lumpectomy and axillary lymph node biopsy. 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  Two sentinel lymph nodes negative for malignancy.  Tumor was ER + >90%, PR + >90%, and HER-2/neu negative (1+).  Pathologic stage was pT1b pN0.    01/14/2019 Cancer Staging  Staging form: Breast, AJCC 8th Edition - Pathologic stage from 01/14/2019: Stage IA (pT1b, pN0, cM0, G1, ER+, PR+, HER2-) - Signed by Babara Call, MD on 05/18/2022 Stage prefix: Initial diagnosis Histologic grading system: 3 grade system   02/16/2019 - 03/21/2019 Radiation Therapy   Adjuvant radiation   03/31/2019 -  Anti-estrogen oral therapy   Started on letrozole    11/29/2020 Mammogram   Bilateral diagnostic mammogram showed No mammographic evidence for malignancy. Stable lumpectomy changes left breast.    # iron deficiency anemia.    Gastroenterology work-up EGD on 07/23/2020 revealed erosive gastropathy with no stigmata of recent bleeding. Colonoscopy on 07/23/2020 revealed diverticulosis in the sigmoid colon, descending colon and ascending colon. There were non-bleeding internal hemorrhoids.   INTERVAL HISTORY Caitlin Ford is a 73 y.o. female who has above history reviewed by me today presents for follow up visit for history of stage Ia left breast cancer, and iron deficiency anemia Ports feeling well.  No new complaints.  She tolerates letrozole . She take oral iron supplementation once a day.    Past Medical History:  Diagnosis Date   Arthritis    Breast cancer (HCC)    Chronic kidney disease    Chronic Renal Insufficiency - Stage 2   DDD (degenerative disc disease), cervical    Diabetes mellitus without complication (HCC)    GERD (gastroesophageal reflux disease)    occ no meds   Goiter    Headache    simus   Hypercholesteremia    Hypertension    Hypothyroidism    Malignant neoplasm of upper-inner quadrant of left breast in female, estrogen receptor positive (HCC) 02/04/2019   Personal history of radiation therapy    Proteinuria    Wears dentures    partial upper, full lower    Past Surgical History:  Procedure Laterality Date   ABDOMINAL HYSTERECTOMY     BREAST BIOPSY Left 12/14/2018   [facility]   BREAST LUMPECTOMY Left 01/14/2019   [facility]   COLONOSCOPY WITH PROPOFOL  N/A 02/09/2015   Procedure: COLONOSCOPY WITH PROPOFOL ;  Surgeon: Deward CINDERELLA Piedmont, MD;  Location: [facility] ENDOSCOPY;  Service: Gastroenterology;  Laterality: N/A;   COLONOSCOPY WITH PROPOFOL  N/A 07/23/2020   Procedure: COLONOSCOPY WITH PROPOFOL ;  Surgeon: Maryruth Ole DASEN, MD;  Location: [facility] ENDOSCOPY;  Service: Endoscopy;  Laterality: N/A;   ESOPHAGOGASTRODUODENOSCOPY (EGD) WITH PROPOFOL  N/A 07/23/2020   Procedure: ESOPHAGOGASTRODUODENOSCOPY (EGD) WITH PROPOFOL ;  Surgeon: Maryruth Ole DASEN, MD;  Location: [facility] ENDOSCOPY;  Service: Endoscopy;   Laterality: N/A;   PARTIAL MASTECTOMY WITH NEEDLE LOCALIZATION AND AXILLARY SENTINEL LYMPH NODE BX Left 01/14/2019   Procedure: PARTIAL MASTECTOMY WITH NEEDLE LOCALIZATION AND AXILLARY SENTINEL LYMPH NODE BX;  Surgeon: Tye Millet, DO;  Location: [facility] ORS;  Service: General;  Laterality: Left;   THYROIDECTOMY      Family History  Problem Relation Age of Onset   Diabetes Mother    Diabetes Maternal Aunt    Diabetes Maternal Uncle    Breast cancer Neg Hx     Social History:  reports that she has never smoked. She has never used smokeless tobacco. She reports that she does not drink alcohol and does not use drugs.  Allergies:  Allergies  Allergen Reactions   Altace [Ramipril] Other (See Comments)    Angioedema   Avandia [Rosiglitazone] Other (See Comments)     Muscle Aches   Crestor [Rosuvastatin] Other (See Comments)    Muscle ache   Doxycycline Other (See Comments)    Muscle Aches  Lipitor [Atorvastatin] Other (See Comments)    Muscle ache    Current Medications: Current Outpatient Medications  Medication Sig Dispense Refill   allopurinol (ZYLOPRIM) 100 MG tablet Take 100 mg by mouth.     aspirin EC 81 MG tablet Take 81 mg by mouth daily.      glimepiride (AMARYL) 4 MG tablet Take 4 mg by mouth in the morning and at bedtime. Combines this with the 2 mg glimepiride     hydrochlorothiazide (HYDRODIURIL) 25 MG tablet Take 25 mg by mouth daily.     JARDIANCE 10 MG TABS tablet Take 10 mg by mouth.     letrozole  (FEMARA ) 2.5 MG tablet Take 1 tablet (2.5 mg total) by mouth daily. 90 tablet 1   levothyroxine (SYNTHROID) 88 MCG tablet Take by mouth.     losartan (COZAAR) 100 MG tablet Take 100 mg by mouth every morning.      metoprolol succinate (TOPROL-XL) 25 MG 24 hr tablet Take 25 mg by mouth daily. Unsure of dose/ just started 8/20     Multiple Vitamin (MULTI-VITAMIN) tablet Take 1 tablet by mouth daily.      ONETOUCH VERIO test strip 1 each 2 (two) times daily.      pioglitazone (ACTOS) 15 MG tablet Take by mouth.     metFORMIN (GLUCOPHAGE-XR) 500 MG 24 hr tablet Take 1,000 mg by mouth 2 (two) times daily.  (Patient not taking: Reported on 04/07/2024)     No current facility-administered medications for this visit.   Review of Systems  Constitutional:  Negative for appetite change, chills, fatigue and fever.  HENT:   Negative for hearing loss and voice change.   Eyes:  Negative for eye problems.  Respiratory:  Negative for chest tightness and cough.   Cardiovascular:  Negative for chest pain.  Gastrointestinal:  Negative for abdominal distention, abdominal pain and blood in stool.  Endocrine: Negative for hot flashes.  Genitourinary:  Negative for difficulty urinating and frequency.   Musculoskeletal:  Negative for arthralgias.  Skin:  Negative for itching and rash.  Neurological:  Negative for extremity weakness.  Hematological:  Negative for adenopathy.  Psychiatric/Behavioral:  Negative for confusion.     Performance status (ECOG): 0  Vitals Blood pressure (!) 145/58, pulse 70, temperature 98.5 F (36.9 C), temperature source Tympanic, resp. rate 18, weight 195 lb 6.4 oz (88.6 kg), SpO2 100%.  Physical Exam Constitutional:      General: She is not in acute distress.    Appearance: She is not diaphoretic.  HENT:     Head: Normocephalic and atraumatic.  Eyes:     General: No scleral icterus. Cardiovascular:     Rate and Rhythm: Normal rate and regular rhythm.  Pulmonary:     Effort: Pulmonary effort is normal. No respiratory distress.  Abdominal:     General: Bowel sounds are normal. There is no distension.     Palpations: Abdomen is soft.  Musculoskeletal:        General: Normal range of motion.     Cervical back: Normal range of motion and neck supple.  Skin:    General: Skin is warm and dry.     Findings: No erythema.  Neurological:     Mental Status: She is alert and oriented to person, place, and time.     Cranial Nerves:  No cranial nerve deficit.     Motor: No abnormal muscle tone.     Coordination: Coordination normal.  Psychiatric:  Mood and Affect: Mood and affect normal.       Labs are reviewed and discussed with patient.    Latest Ref Rng & Units 04/07/2024   10:10 AM 09/29/2023    8:47 AM 03/25/2023   10:13 AM  CBC  WBC 4.0 - 10.5 K/uL 4.5  5.2  8.4   Hemoglobin 12.0 - 15.0 g/dL 9.8  9.2  89.0   Hematocrit 36.0 - 46.0 % 30.7  29.4  34.6   Platelets 150 - 400 K/uL 189  190  187        Latest Ref Rng & Units 04/07/2024   10:10 AM 09/29/2023    8:47 AM 12/24/2022   10:05 AM  CMP  Glucose 70 - 99 mg/dL 834  877  65   BUN 8 - 23 mg/dL 43  38  33   Creatinine 0.44 - 1.00 mg/dL 7.87  7.71  8.45   Sodium 135 - 145 mmol/L 140  143  138   Potassium 3.5 - 5.1 mmol/L 5.1  5.4  4.8   Chloride 98 - 111 mmol/L 109  109  107   CO2 22 - 32 mmol/L 22  24  25    Calcium 8.9 - 10.3 mg/dL 9.5  9.8  9.2   Total Protein 6.5 - 8.1 g/dL 7.3  7.4  7.6   Total Bilirubin 0.0 - 1.2 mg/dL 0.4  0.3  0.3   Alkaline Phos 38 - 126 U/L 86  78  62   AST 15 - 41 U/L 22  24  18    ALT 0 - 44 U/L 24  22  16      Iron/TIBC/Ferritin/ %Sat    Component Value Date/Time   IRON 67 09/29/2023 0847   TIBC 298 09/29/2023 0847   FERRITIN 67 09/29/2023 0847   IRONPCTSAT 23 09/29/2023 0847

## 2024-04-07 NOTE — Assessment & Plan Note (Signed)
 She follows up with nephrology.  Avoid nephrotoxins

## 2024-04-07 NOTE — Assessment & Plan Note (Signed)
 Recommend low K diet. Follow up with nephrology.

## 2024-04-07 NOTE — Assessment & Plan Note (Addendum)
 History of stage IA left breast cancer, ER positive, PR positive, HER2 negative. pT1b pN0  Labs reviewed and discussed with patient Finished 5 years of Letrozole   03/31/2019, plan 5 years, till 03/2024 Recommend patient to finish her current supply and stop Obtain annual mammogram, next due in August  2026

## 2024-04-29 ENCOUNTER — Ambulatory Visit
Admission: EM | Admit: 2024-04-29 | Discharge: 2024-04-29 | Disposition: A | Discharge status: Home / Self Care | Attending: Emergency Medicine | Admitting: Emergency Medicine

## 2024-04-29 DIAGNOSIS — M5441 Lumbago with sciatica, right side: Secondary | ICD-10-CM

## 2024-04-29 MED ORDER — HYDROCODONE-ACETAMINOPHEN 5-325 MG PO TABS: 1.0000 | ORAL_TABLET | Freq: Three times a day (TID) | ORAL | 0 refills | Status: AC | PRN

## 2024-04-29 NOTE — ED Provider Notes (Signed)
 [facility]    CSN: [id number] Arrival date & time: 04/29/24  1127      History   Chief Complaint Chief Complaint  Patient presents with   Back Pain    HPI Caitlin Ford is a 73 y.o. female.   73 year old female, Caitlin Ford, presents to urgent care for evaluation of back pain x 2 weeks, pain is worse when lays flat/movement.  Patient denies any fall or injury.  Patient denies any loss of bowel or bladder, no loss of function, no saddle numbness.  Has taken Tylenol  with minimal relief  Pmh: Malignant breast cancer, CKD,DDD,DM  The history is provided by the patient. No language interpreter was used.    Past Medical History:  Diagnosis Date   Arthritis    Breast cancer (HCC)    Chronic kidney disease    Chronic Renal Insufficiency - Stage 2   DDD (degenerative disc disease), cervical    Diabetes mellitus without complication (HCC)    GERD (gastroesophageal reflux disease)    occ no meds   Goiter    Headache    simus   Hypercholesteremia    Hypertension    Hypothyroidism    Malignant neoplasm of upper-inner quadrant of left breast in female, estrogen receptor positive (HCC) 02/04/2019   Personal history of radiation therapy    Proteinuria    Wears dentures    partial upper, full lower    Patient Active Problem List   Diagnosis Date Noted   Acute right-sided low back pain with right-sided sciatica 04/29/2024   Hyperkalemia 10/06/2023   Stage 3a chronic kidney disease (CKD) (HCC) 11/07/2021   Aromatase inhibitor use 05/09/2021   Anemia in chronic kidney disease (CKD) 04/24/2020   Malignant neoplasm of upper-inner quadrant of left breast in female, estrogen receptor positive (HCC) 02/04/2019    Past Surgical History:  Procedure Laterality Date   ABDOMINAL HYSTERECTOMY     BREAST BIOPSY Left 12/14/2018   [facility]   BREAST LUMPECTOMY Left 01/14/2019   [facility]   COLONOSCOPY WITH PROPOFOL  N/A 02/09/2015   Procedure: COLONOSCOPY WITH PROPOFOL ;  Surgeon:  Deward CINDERELLA Piedmont, MD;  Location: [facility];  Service: Gastroenterology;  Laterality: N/A;   COLONOSCOPY WITH PROPOFOL  N/A 07/23/2020   Procedure: COLONOSCOPY WITH PROPOFOL ;  Surgeon: Maryruth Ole DASEN, MD;  Location: [facility];  Service: Endoscopy;  Laterality: N/A;   ESOPHAGOGASTRODUODENOSCOPY (EGD) WITH PROPOFOL  N/A 07/23/2020   Procedure: ESOPHAGOGASTRODUODENOSCOPY (EGD) WITH PROPOFOL ;  Surgeon: Maryruth Ole DASEN, MD;  Location: [facility];  Service: Endoscopy;  Laterality: N/A;   PARTIAL MASTECTOMY WITH NEEDLE LOCALIZATION AND AXILLARY SENTINEL LYMPH NODE BX Left 01/14/2019   Procedure: PARTIAL MASTECTOMY WITH NEEDLE LOCALIZATION AND AXILLARY SENTINEL LYMPH NODE BX;  Surgeon: Tye Millet, DO;  Location: [facility]S;  Service: General;  Laterality: Left;   THYROIDECTOMY      OB History   No obstetric history on file.      Home Medications    Prior to Admission medications   Medication Sig Start Date End Date Taking? Authorizing Provider  allopurinol (ZYLOPRIM) 100 MG tablet Take 100 mg by mouth. 01/11/24  Yes [provider]  aspirin EC 81 MG tablet Take 81 mg by mouth daily.    Yes [provider]  glimepiride (AMARYL) 4 MG tablet Take 4 mg by mouth in the morning and at bedtime. Combines this with the 2 mg glimepiride   Yes [provider]  hydrochlorothiazide (HYDRODIURIL) 25 MG tablet Take 25 mg  by mouth daily.   Yes [provider]  HYDROcodone -acetaminophen  (NORCO/VICODIN) 5-325 MG tablet Take 1 tablet by mouth every 8 (eight) hours as needed for up to 3 days for severe pain (pain score 7-10). 04/29/24 05/02/24 Yes Lylian Sanagustin, Rilla, NP  JARDIANCE 10 MG TABS tablet Take 10 mg by mouth. 04/06/24  Yes [provider]  letrozole  (FEMARA ) 2.5 MG tablet Take 1 tablet (2.5 mg total) by mouth daily. 10/06/23  Yes Babara Call, MD  losartan (COZAAR) 100 MG tablet Take 100 mg by mouth every morning.    Yes [provider]  metoprolol  succinate (TOPROL-XL) 25 MG 24 hr tablet Take 25 mg by mouth daily. Unsure of dose/ just started 8/20   Yes [provider]  Multiple Vitamin (MULTI-VITAMIN) tablet Take 1 tablet by mouth daily.    Yes [provider]  [facility] VERIO test strip 1 each 2 (two) times daily. 03/30/20  Yes [provider]  levothyroxine (SYNTHROID) 88 MCG tablet Take by mouth. 11/05/20 04/07/24  [provider]  metFORMIN (GLUCOPHAGE-XR) 500 MG 24 hr tablet Take 1,000 mg by mouth 2 (two) times daily.  Patient not taking: Reported on 04/07/2024 08/13/18 04/07/24  [provider]  pioglitazone (ACTOS) 15 MG tablet Take by mouth. 09/27/19 04/07/24  [provider]    Family History Family History  Problem Relation Age of Onset   Diabetes Mother    Diabetes Maternal Aunt    Diabetes Maternal Uncle    Breast cancer Neg Hx     Social History Social History   Tobacco Use   Smoking status: Never   Smokeless tobacco: Never  Vaping Use   Vaping status: Never Used  Substance Use Topics   Alcohol use: No   Drug use: No     Allergies   Altace [ramipril], Avandia [rosiglitazone], Crestor [rosuvastatin], Doxycycline, and Lipitor [atorvastatin]   Review of Systems Review of Systems  Constitutional:  Negative for fever.  Genitourinary:  Negative for dysuria.  Musculoskeletal:  Positive for back pain and myalgias.  All other systems reviewed and are negative.    Physical Exam Triage Vital Signs ED Triage Vitals  Encounter Vitals Group     BP 04/29/24 1148 (!) 145/84     Girls Systolic BP Percentile --      Girls Diastolic BP Percentile --      Boys Systolic BP Percentile --      Boys Diastolic BP Percentile --      Pulse Rate 04/29/24 1148 (!) 105     Resp 04/29/24 1148 12     Temp 04/29/24 1148 98.9 F (37.2 C)     Temp Source 04/29/24 1148 Oral     SpO2 04/29/24 1148 96 %     Weight 04/29/24 1146 191 lb 12.8 oz (87 kg)     Height --      Head  Circumference --      Peak Flow --      Pain Score 04/29/24 1147 9     Pain Loc --      Pain Education --      Exclude from Growth Chart --    No data found.  Updated Vital Signs BP (!) 145/84 (BP Location: Left Arm)   Pulse (!) 105   Temp 98.9 F (37.2 C) (Oral)   Resp 12   Wt 191 lb 12.8 oz (87 kg)   SpO2 96%   BMI 29.16 kg/m   Visual Acuity Right Eye Distance:  Left Eye Distance:   Bilateral Distance:    Right Eye Near:   Left Eye Near:    Bilateral Near:     Physical Exam Vitals and nursing note reviewed.  Constitutional:      General: She is not in acute distress.    Appearance: She is well-developed and well-groomed.  HENT:     Head: Normocephalic and atraumatic.  Eyes:     Conjunctiva/sclera: Conjunctivae normal.  Cardiovascular:     Rate and Rhythm: Normal rate and regular rhythm.     Heart sounds: Normal heart sounds. No murmur heard. Pulmonary:     Effort: Pulmonary effort is normal. No respiratory distress.     Breath sounds: Normal breath sounds and air entry.  Abdominal:     Palpations: Abdomen is soft.     Tenderness: There is no abdominal tenderness.  Musculoskeletal:        General: No swelling.     Cervical back: Neck supple.     Lumbar back: Spasms and tenderness present. No swelling, edema, deformity, signs of trauma, lacerations or bony tenderness. Normal range of motion. Positive right straight leg raise test. Negative left straight leg raise test. No scoliosis.  Skin:    General: Skin is warm and dry.     Capillary Refill: Capillary refill takes less than 2 seconds.  Neurological:     General: No focal deficit present.     Mental Status: She is alert and oriented to person, place, and time.     GCS: GCS eye subscore is 4. GCS verbal subscore is 5. GCS motor subscore is 6.  Psychiatric:        Attention and Perception: Attention normal.        Mood and Affect: Mood normal.        Speech: Speech normal.        Behavior: Behavior  normal. Behavior is cooperative.      UC Treatments / Results  Labs (all labs ordered are listed, but only abnormal results are displayed) Labs Reviewed - No data to display  EKG   Radiology No results found.  Procedures Procedures (including critical care time)  Medications Ordered in UC Medications - No data to display  Initial Impression / Assessment and Plan / UC Course  I have reviewed the triage vital signs and the nursing notes.  Pertinent labs & imaging results that were available during my care of the patient were reviewed by me and considered in my medical decision making (see chart for details).    Discussed exam findings and plan of care with patient, lidocaine  gel/patch, vicodin for pain(drowsiness precautions), does not live alone, rise slowly, fall precautions, strict go to ER precautions given(fever, abdominal pain, nausea,vomiting, loss of bowel and bladder,saddle numbness,etc).   Patient verbalized understanding to this provider.  Ddx: Back pain, sciatica, muscle spasm Final Clinical Impressions(s) / UC Diagnoses   Final diagnoses:  Acute right-sided low back pain with right-sided sciatica     Discharge Instructions      Vicodin pain med scripted, take with caution, do not drink alcohol or drive while taking medication. Get up slowly as it may make you sleepy. Do not take any additional tylenol  while taking pain med as it has tylenol  in it. Take home meds as directed. May use heat or ice to back for comfort. May use lidocaine  patch or biofreeze for pain as label directed. Please follow up with PCP, may need to referral to physical therapy for further  back pain management. GO immediately to ER for loss of bowel and bladder,loss of function, saddle numbness, or worsening symptoms, etc.      ED Prescriptions     Medication Sig Dispense Auth. Provider   HYDROcodone -acetaminophen  (NORCO/VICODIN) 5-325 MG tablet Take 1 tablet by mouth every 8 (eight) hours  as needed for up to 3 days for severe pain (pain score 7-10). 6 tablet Akiyah Eppolito, Rilla, NP      I have reviewed the PDMP during this encounter.   Aminta Rilla, NP 04/29/24 1727

## 2024-04-29 NOTE — Discharge Instructions (Addendum)
 Vicodin pain med scripted, take with caution, do not drink alcohol or drive while taking medication. Get up slowly as it may make you sleepy. Do not take any additional tylenol  while taking pain med as it has tylenol  in it. Take home meds as directed. May use heat or ice to back for comfort. May use lidocaine  patch or biofreeze for pain as label directed. Please follow up with PCP, may need to referral to physical therapy for further back pain management. GO immediately to ER for loss of bowel and bladder,loss of function, saddle numbness, or worsening symptoms, etc.

## 2024-04-29 NOTE — ED Triage Notes (Signed)
 Pt c/o back pain x2-3weeks  Pt states that the pain has worsened   Pt states that the pain is worse when she lays flat and she has trouble moving due to the pain

## 2024-05-17 DIAGNOSIS — H5213 Myopia, bilateral: Secondary | ICD-10-CM | POA: Diagnosis not present

## 2024-10-03 ENCOUNTER — Inpatient Hospital Stay

## 2024-10-06 ENCOUNTER — Inpatient Hospital Stay

## 2024-10-06 ENCOUNTER — Inpatient Hospital Stay: Admitting: Oncology
